# Patient Record
Sex: Female | Born: 1990 | Race: White | Hispanic: No | Marital: Married | Smoking: Former smoker
Health system: Southern US, Community
[De-identification: ages and names within clinical notes are randomized; demographics above are authoritative.]

## PROBLEM LIST (undated history)

## (undated) ENCOUNTER — Inpatient Hospital Stay (HOSPITAL_COMMUNITY): Payer: Self-pay

## (undated) ENCOUNTER — Inpatient Hospital Stay (HOSPITAL_COMMUNITY): Payer: Medicaid Other

## (undated) DIAGNOSIS — F32A Depression, unspecified: Secondary | ICD-10-CM

## (undated) DIAGNOSIS — L309 Dermatitis, unspecified: Secondary | ICD-10-CM

## (undated) DIAGNOSIS — Z789 Other specified health status: Secondary | ICD-10-CM

## (undated) DIAGNOSIS — K8020 Calculus of gallbladder without cholecystitis without obstruction: Secondary | ICD-10-CM

## (undated) DIAGNOSIS — R519 Headache, unspecified: Secondary | ICD-10-CM

## (undated) DIAGNOSIS — K219 Gastro-esophageal reflux disease without esophagitis: Secondary | ICD-10-CM

## (undated) DIAGNOSIS — T7840XA Allergy, unspecified, initial encounter: Secondary | ICD-10-CM

## (undated) DIAGNOSIS — O1490 Unspecified pre-eclampsia, unspecified trimester: Secondary | ICD-10-CM

## (undated) DIAGNOSIS — G8929 Other chronic pain: Secondary | ICD-10-CM

## (undated) DIAGNOSIS — Z87440 Personal history of urinary (tract) infections: Secondary | ICD-10-CM

## (undated) HISTORY — DX: Dermatitis, unspecified: L30.9

## (undated) HISTORY — DX: Personal history of urinary (tract) infections: Z87.440

## (undated) HISTORY — PX: ADENOIDECTOMY: SUR15

## (undated) HISTORY — DX: Depression, unspecified: F32.A

## (undated) HISTORY — DX: Allergy, unspecified, initial encounter: T78.40XA

## (undated) HISTORY — DX: Unspecified pre-eclampsia, unspecified trimester: O14.90

## (undated) HISTORY — DX: Other chronic pain: G89.29

## (undated) HISTORY — PX: TONSILLECTOMY: SUR1361

## (undated) HISTORY — PX: ERCP: SHX60

## (undated) HISTORY — DX: Headache, unspecified: R51.9

---
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
## 2010-01-30 NOTE — L&D Delivery Note (Signed)
Delivery Note At 7:11 AM a viable and healthy female was delivered via Vaginal, Spontaneous Delivery (Presentation: Left Occiput Anterior).  APGAR: 9, 9; weight pending.   Placenta status: Intact, Spontaneous.  Cord: 3 vessels with the following complications: None.    Anesthesia: Local  Episiotomy: None Lacerations: 1st degree;Perineal, 1st degree periclitoral Suture Repair: 3.0 vicryl Est. Blood Loss (mL): 200  Mom to postpartum.  Baby to nursery-stable.  Miliana Gangwer JEHIEL 12/01/2010, 7:40 AM

## 2010-03-03 LAB — HIV ANTIBODY (ROUTINE TESTING W REFLEX): HIV: NONREACTIVE

## 2010-04-21 LAB — URINALYSIS, ROUTINE W REFLEX MICROSCOPIC
Bilirubin Urine: NEGATIVE
Glucose, UA: NEGATIVE mg/dL
Hgb urine dipstick: NEGATIVE
Ketones, ur: NEGATIVE mg/dL
Nitrite: NEGATIVE
Protein, ur: NEGATIVE mg/dL
Specific Gravity, Urine: 1.027 (ref 1.005–1.030)
Urobilinogen, UA: 4 mg/dL — ABNORMAL HIGH (ref 0.0–1.0)
pH: 7 (ref 5.0–8.0)

## 2010-05-09 ENCOUNTER — Inpatient Hospital Stay (HOSPITAL_COMMUNITY)
Admission: AD | Admit: 2010-05-09 | Discharge: 2010-05-09 | Disposition: A | Discharge status: Home / Self Care | Payer: Medicaid Other | Source: Ambulatory Visit | Attending: Family Medicine | Admitting: Family Medicine

## 2010-05-09 DIAGNOSIS — O211 Hyperemesis gravidarum with metabolic disturbance: Secondary | ICD-10-CM

## 2010-05-09 DIAGNOSIS — O210 Mild hyperemesis gravidarum: Secondary | ICD-10-CM | POA: Insufficient documentation

## 2010-05-09 DIAGNOSIS — O2390 Unspecified genitourinary tract infection in pregnancy, unspecified trimester: Secondary | ICD-10-CM

## 2010-05-09 LAB — URINALYSIS, ROUTINE W REFLEX MICROSCOPIC
Ketones, ur: 15 mg/dL — AB
Leukocytes, UA: NEGATIVE
Nitrite: POSITIVE — AB
Nitrite: NEGATIVE
Specific Gravity, Urine: >1.03 — ABNORMAL HIGH (ref 1.005–1.030)
Specific Gravity, Urine: 1.025 (ref 1.005–1.030)
Urobilinogen, UA: 1 mg/dL (ref 0.0–1.0)
Urobilinogen, UA: 4 mg/dL — ABNORMAL HIGH (ref 0.0–1.0)

## 2010-05-09 LAB — CBC
Hemoglobin: 12.1 g/dL (ref 12.0–15.0)
MCH: 25.6 pg — ABNORMAL LOW (ref 26.0–34.0)
MCHC: 32.5 g/dL (ref 30.0–36.0)
MCV: 78.8 fL (ref 78.0–100.0)
RBC: 4.72 MIL/uL (ref 3.87–5.11)

## 2010-05-09 LAB — COMPREHENSIVE METABOLIC PANEL
BUN: 3 mg/dL — ABNORMAL LOW (ref 6–23)
CO2: 24 mEq/L (ref 19–32)
Chloride: 102 mEq/L (ref 96–112)
Creatinine, Ser: 0.6 mg/dL (ref 0.4–1.2)
GFR calc non Af Amer: >60 mL/min (ref >60)
Glucose, Bld: 82 mg/dL (ref 70–99)
Total Bilirubin: 1.6 mg/dL — ABNORMAL HIGH (ref 0.3–1.2)

## 2010-05-09 LAB — GLUCOSE, CAPILLARY: Glucose-Capillary: 88 mg/dL (ref 70–99)

## 2010-05-10 LAB — URINE MICROSCOPIC-ADD ON

## 2010-05-10 LAB — WET PREP, GENITAL: Trich, Wet Prep: NONE SEEN

## 2010-05-10 LAB — URINALYSIS, ROUTINE W REFLEX MICROSCOPIC
Glucose, UA: NEGATIVE mg/dL
Ketones, ur: 40 mg/dL — AB
Leukocytes, UA: NEGATIVE
pH: 6 (ref 5.0–8.0)

## 2010-05-10 LAB — URINE CULTURE
Colony Count: NO GROWTH
Culture  Setup Time: 201204100016

## 2010-05-10 LAB — GC/CHLAMYDIA PROBE AMP, GENITAL: Chlamydia, DNA Probe: NEGATIVE

## 2010-05-24 ENCOUNTER — Inpatient Hospital Stay (HOSPITAL_COMMUNITY)
Admission: AD | Admit: 2010-05-24 | Discharge: 2010-05-24 | Disposition: A | Discharge status: Home / Self Care | Payer: Medicaid Other | Source: Ambulatory Visit | Attending: Obstetrics & Gynecology | Admitting: Obstetrics & Gynecology

## 2010-05-24 DIAGNOSIS — O210 Mild hyperemesis gravidarum: Secondary | ICD-10-CM

## 2010-05-24 LAB — COMPREHENSIVE METABOLIC PANEL
ALT: 27 U/L (ref 0–35)
AST: 26 U/L (ref 0–37)
CO2: 24 mEq/L (ref 19–32)
Calcium: 9.1 mg/dL (ref 8.4–10.5)
Chloride: 104 mEq/L (ref 96–112)
GFR calc Af Amer: >60 mL/min (ref >60)
GFR calc non Af Amer: >60 mL/min (ref >60)
Sodium: 137 mEq/L (ref 135–145)
Total Bilirubin: 1.8 mg/dL — ABNORMAL HIGH (ref 0.3–1.2)

## 2010-05-24 LAB — URINALYSIS, ROUTINE W REFLEX MICROSCOPIC
Glucose, UA: NEGATIVE mg/dL
Ketones, ur: >80 mg/dL — AB
Protein, ur: NEGATIVE mg/dL

## 2010-05-24 LAB — URINE MICROSCOPIC-ADD ON

## 2010-05-24 LAB — CBC
Hemoglobin: 12.1 g/dL (ref 12.0–15.0)
RBC: 4.7 MIL/uL (ref 3.87–5.11)

## 2010-05-25 ENCOUNTER — Observation Stay (HOSPITAL_COMMUNITY)
Admission: AD | Admit: 2010-05-25 | Discharge: 2010-05-26 | Disposition: A | Discharge status: Home / Self Care | Payer: Medicaid Other | Source: Ambulatory Visit | Attending: Obstetrics & Gynecology | Admitting: Obstetrics & Gynecology

## 2010-05-25 ENCOUNTER — Inpatient Hospital Stay (HOSPITAL_COMMUNITY): Payer: Medicaid Other

## 2010-05-25 DIAGNOSIS — O210 Mild hyperemesis gravidarum: Principal | ICD-10-CM | POA: Insufficient documentation

## 2010-05-25 LAB — DIFFERENTIAL
Eosinophils Relative: 1 % (ref 0–5)
Lymphocytes Relative: 28 % (ref 12–46)
Lymphs Abs: 1.7 10*3/uL (ref 0.7–4.0)
Neutro Abs: 4 10*3/uL (ref 1.7–7.7)

## 2010-05-25 LAB — CBC
HCT: 31.1 % — ABNORMAL LOW (ref 36.0–46.0)
Hemoglobin: 10 g/dL — ABNORMAL LOW (ref 12.0–15.0)
MCV: 79.1 fL (ref 78.0–100.0)
RBC: 3.93 MIL/uL (ref 3.87–5.11)
WBC: 6.2 10*3/uL (ref 4.0–10.5)

## 2010-05-25 LAB — COMPREHENSIVE METABOLIC PANEL
AST: 31 U/L (ref 0–37)
Albumin: 2.7 g/dL — ABNORMAL LOW (ref 3.5–5.2)
Alkaline Phosphatase: 55 U/L (ref 39–117)
Chloride: 105 mEq/L (ref 96–112)
GFR calc Af Amer: >60 mL/min (ref >60)
Potassium: 3 mEq/L — ABNORMAL LOW (ref 3.5–5.1)
Sodium: 135 mEq/L (ref 135–145)
Total Bilirubin: 1.7 mg/dL — ABNORMAL HIGH (ref 0.3–1.2)
Total Protein: 5.1 g/dL — ABNORMAL LOW (ref 6.0–8.3)

## 2010-05-25 LAB — URINALYSIS, ROUTINE W REFLEX MICROSCOPIC
Hgb urine dipstick: NEGATIVE
Ketones, ur: >80 mg/dL — AB
Nitrite: NEGATIVE
pH: 6 (ref 5.0–8.0)

## 2010-05-25 LAB — RPR: RPR: NONREACTIVE

## 2010-05-25 LAB — URINE CULTURE: Culture  Setup Time: 201204241905

## 2010-05-25 LAB — RAPID URINE DRUG SCREEN, HOSP PERFORMED
Amphetamines: NOT DETECTED
Barbiturates: NOT DETECTED

## 2010-05-25 LAB — TYPE AND SCREEN: Antibody Screen: NEGATIVE

## 2010-05-25 LAB — URINE MICROSCOPIC-ADD ON

## 2010-05-25 LAB — ABO/RH: RH Type: NEGATIVE

## 2010-05-26 LAB — HEPATITIS B SURFACE ANTIGEN: Hepatitis B Surface Ag: NEGATIVE

## 2010-05-26 LAB — HIV ANTIBODY (ROUTINE TESTING W REFLEX): HIV: NONREACTIVE

## 2010-05-26 LAB — T3, FREE: T3, Free: 3.8 pg/mL (ref 2.3–4.2)

## 2010-06-03 ENCOUNTER — Observation Stay (HOSPITAL_COMMUNITY)
Admission: AD | Admit: 2010-06-03 | Discharge: 2010-06-06 | Disposition: A | Discharge status: Home / Self Care | Payer: Medicaid Other | Source: Ambulatory Visit | Attending: Obstetrics & Gynecology | Admitting: Obstetrics & Gynecology

## 2010-06-03 DIAGNOSIS — O210 Mild hyperemesis gravidarum: Principal | ICD-10-CM | POA: Insufficient documentation

## 2010-06-03 LAB — URINALYSIS, ROUTINE W REFLEX MICROSCOPIC
Ketones, ur: >80 mg/dL — AB
Leukocytes, UA: NEGATIVE
Nitrite: NEGATIVE
Protein, ur: 30 mg/dL — AB
pH: 6 (ref 5.0–8.0)

## 2010-06-03 LAB — BASIC METABOLIC PANEL
CO2: 19 mEq/L (ref 19–32)
Chloride: 99 mEq/L (ref 96–112)
Glucose, Bld: 75 mg/dL (ref 70–99)
Potassium: 3.5 mEq/L (ref 3.5–5.1)
Sodium: 137 mEq/L (ref 135–145)

## 2010-06-05 DIAGNOSIS — O210 Mild hyperemesis gravidarum: Secondary | ICD-10-CM

## 2010-06-26 ENCOUNTER — Inpatient Hospital Stay (HOSPITAL_COMMUNITY)
Admission: AD | Admit: 2010-06-26 | Discharge: 2010-06-27 | Disposition: A | Discharge status: Home / Self Care | Payer: Medicaid Other | Source: Ambulatory Visit | Attending: Obstetrics & Gynecology | Admitting: Obstetrics & Gynecology

## 2010-06-26 DIAGNOSIS — O210 Mild hyperemesis gravidarum: Secondary | ICD-10-CM | POA: Insufficient documentation

## 2010-06-26 LAB — URINALYSIS, ROUTINE W REFLEX MICROSCOPIC
Glucose, UA: NEGATIVE mg/dL
Hgb urine dipstick: NEGATIVE
Nitrite: NEGATIVE
Specific Gravity, Urine: >1.03 — ABNORMAL HIGH (ref 1.005–1.030)
pH: 6 (ref 5.0–8.0)

## 2010-06-26 LAB — URINE MICROSCOPIC-ADD ON

## 2010-08-08 ENCOUNTER — Encounter (HOSPITAL_COMMUNITY): Payer: Self-pay | Admitting: *Deleted

## 2010-08-08 ENCOUNTER — Inpatient Hospital Stay (HOSPITAL_COMMUNITY)
Admission: AD | Admit: 2010-08-08 | Payer: Medicaid Other | Source: Ambulatory Visit | Admitting: Obstetrics & Gynecology

## 2010-08-08 ENCOUNTER — Inpatient Hospital Stay (HOSPITAL_COMMUNITY)
Admission: AD | Admit: 2010-08-08 | Discharge: 2010-08-09 | DRG: 781 | Disposition: A | Discharge status: Home / Self Care | Payer: Medicaid Other | Source: Ambulatory Visit | Attending: Obstetrics & Gynecology | Admitting: Obstetrics & Gynecology

## 2010-08-08 ENCOUNTER — Inpatient Hospital Stay (HOSPITAL_COMMUNITY): Payer: Medicaid Other

## 2010-08-08 DIAGNOSIS — O212 Late vomiting of pregnancy: Secondary | ICD-10-CM

## 2010-08-08 DIAGNOSIS — O9989 Other specified diseases and conditions complicating pregnancy, childbirth and the puerperium: Secondary | ICD-10-CM | POA: Diagnosis present

## 2010-08-08 DIAGNOSIS — R1011 Right upper quadrant pain: Secondary | ICD-10-CM

## 2010-08-08 DIAGNOSIS — K8020 Calculus of gallbladder without cholecystitis without obstruction: Secondary | ICD-10-CM

## 2010-08-08 LAB — COMPREHENSIVE METABOLIC PANEL
AST: 18 U/L (ref 0–37)
CO2: 25 mEq/L (ref 19–32)
Calcium: 9 mg/dL (ref 8.4–10.5)
Creatinine, Ser: 0.53 mg/dL (ref 0.50–1.10)
GFR calc Af Amer: >60 mL/min (ref >60)
GFR calc non Af Amer: >60 mL/min (ref >60)

## 2010-08-08 LAB — URINE MICROSCOPIC-ADD ON

## 2010-08-08 LAB — DIFFERENTIAL
Lymphocytes Relative: 14 % (ref 12–46)
Lymphs Abs: 1.9 10*3/uL (ref 0.7–4.0)
Monocytes Relative: 6 % (ref 3–12)
Neutro Abs: 10.1 10*3/uL — ABNORMAL HIGH (ref 1.7–7.7)
Neutrophils Relative %: 76 % (ref 43–77)

## 2010-08-08 LAB — CBC
Hemoglobin: 10.7 g/dL — ABNORMAL LOW (ref 12.0–15.0)
MCH: 27.3 pg (ref 26.0–34.0)
RBC: 3.92 MIL/uL (ref 3.87–5.11)
WBC: 13.3 10*3/uL — ABNORMAL HIGH (ref 4.0–10.5)

## 2010-08-08 LAB — URINALYSIS, ROUTINE W REFLEX MICROSCOPIC
Glucose, UA: NEGATIVE mg/dL
Ketones, ur: NEGATIVE mg/dL
Protein, ur: NEGATIVE mg/dL

## 2010-08-08 LAB — AMYLASE: Amylase: 106 U/L — ABNORMAL HIGH (ref 0–105)

## 2010-08-08 LAB — LIPASE, BLOOD: Lipase: 45 U/L (ref 11–59)

## 2010-08-08 IMAGING — US US ABDOMEN COMPLETE
1 series · 14 of 25 positions shown · non-contrast
Comparison: None.

CLINICAL DATA: Right upper quadrant pain.  22 weeks pregnant.

COMPLETE ABDOMINAL ULTRASOUND

[Series 1: us abdomen complete · 14 of 68 slices shown]
[im 1/68]
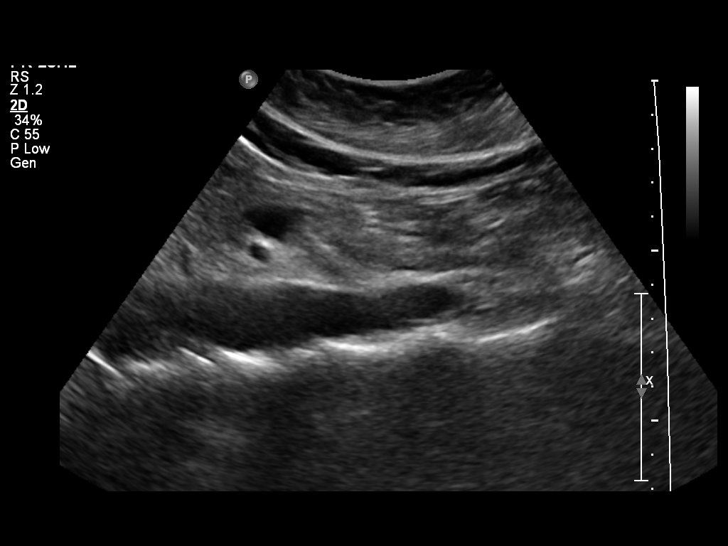
[im 6/68]
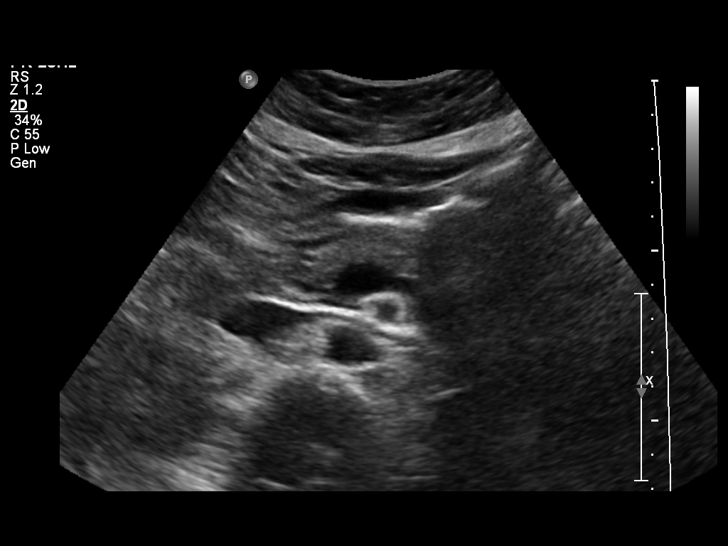
[im 12/68]
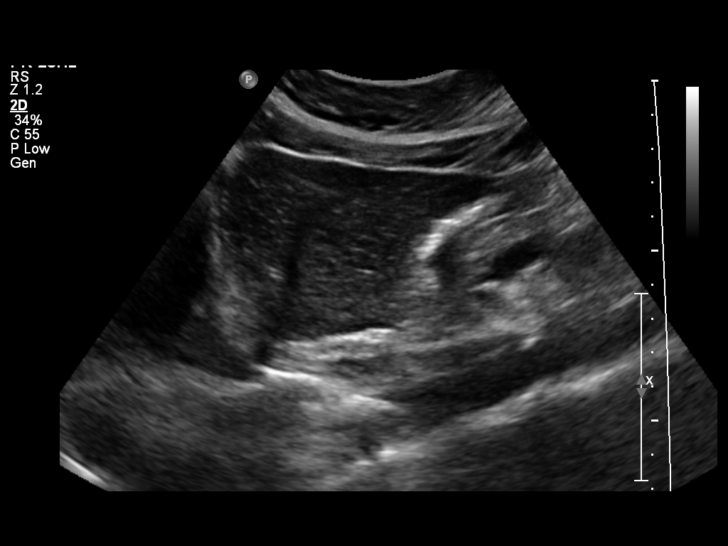
[im 17/68]
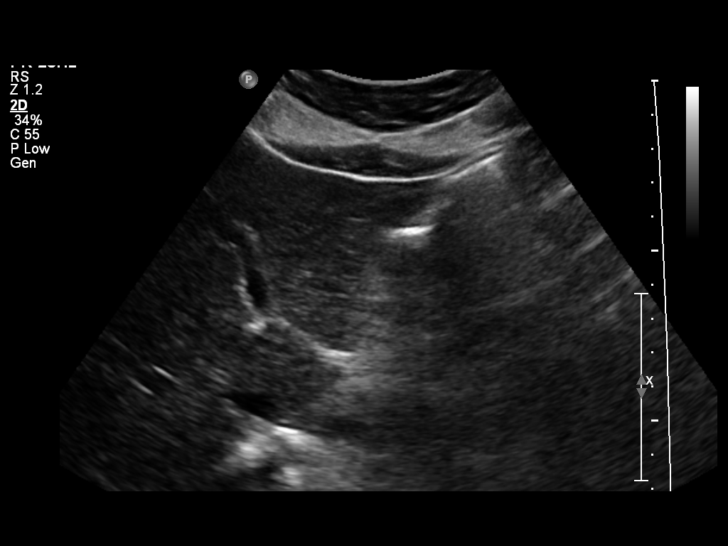
[im 23/68]
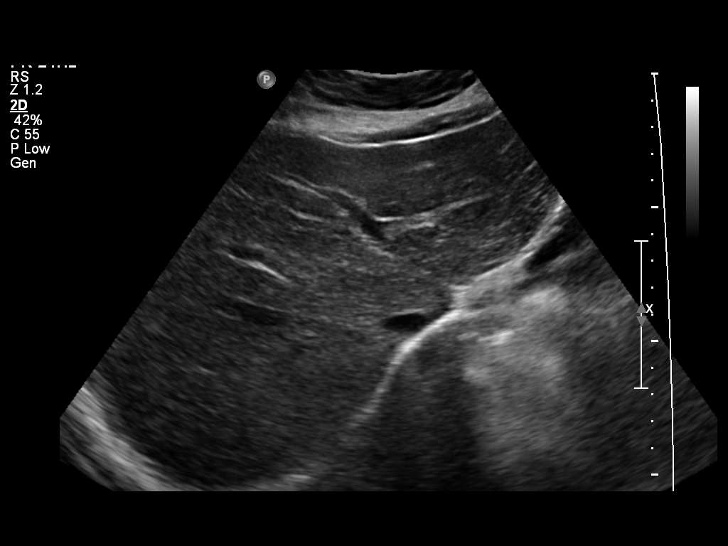
[im 26/68]
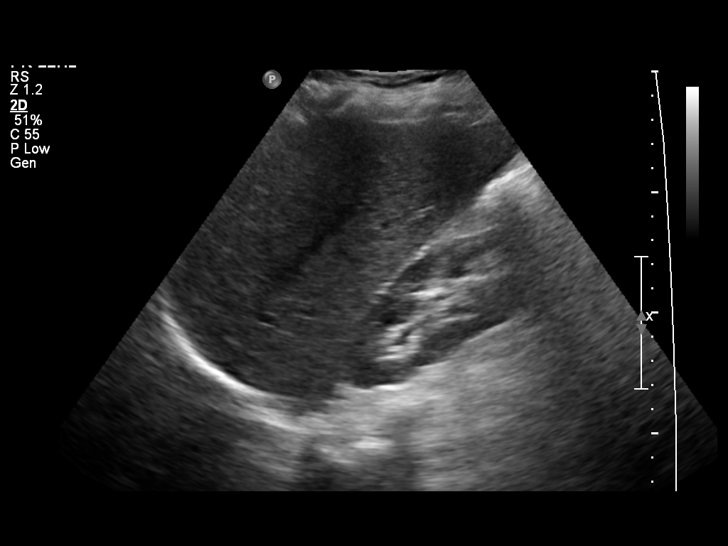
[im 31/68]
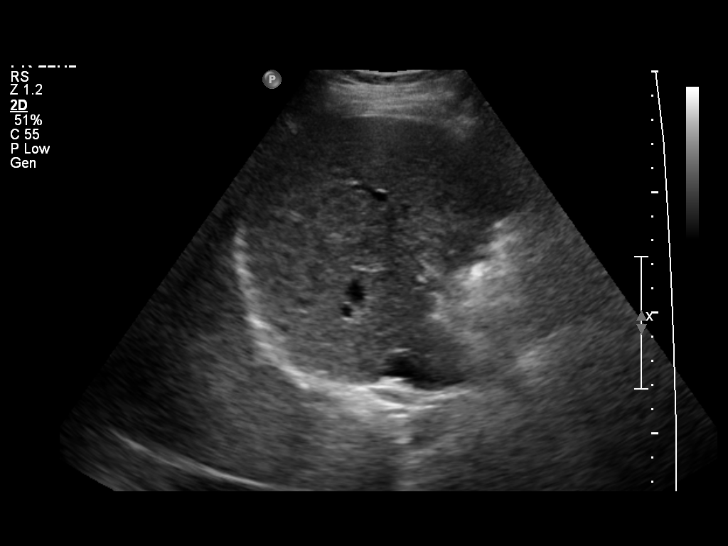
[im 37/68]
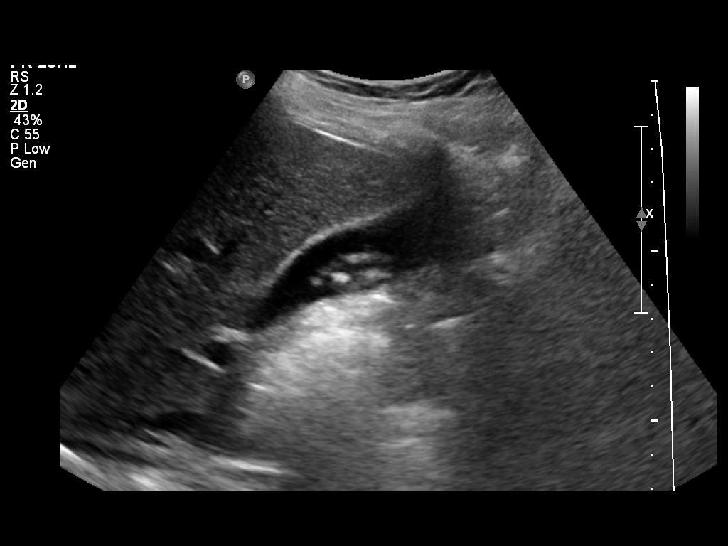
[im 42/68]
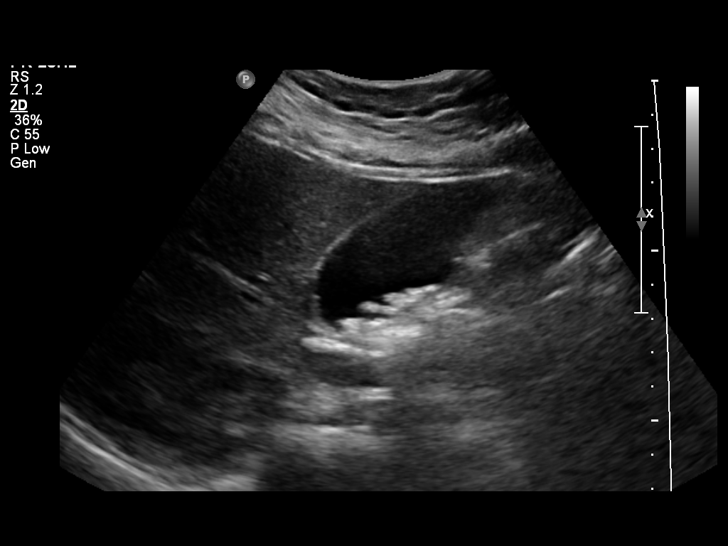
[im 45/68]
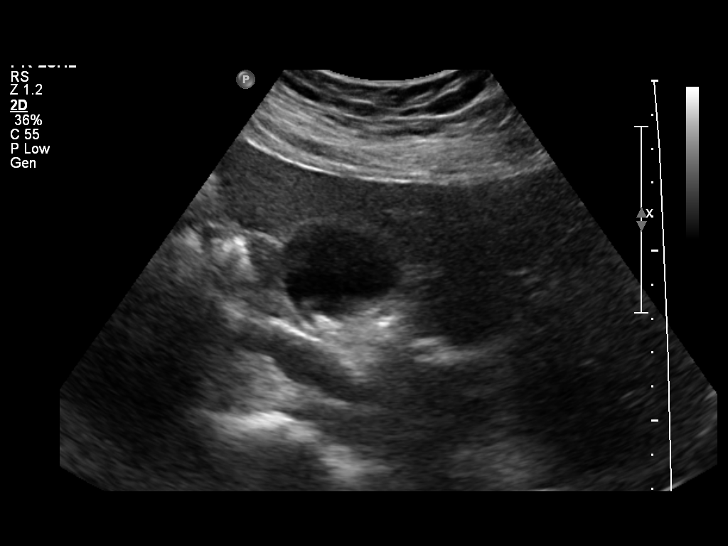
[im 51/68]
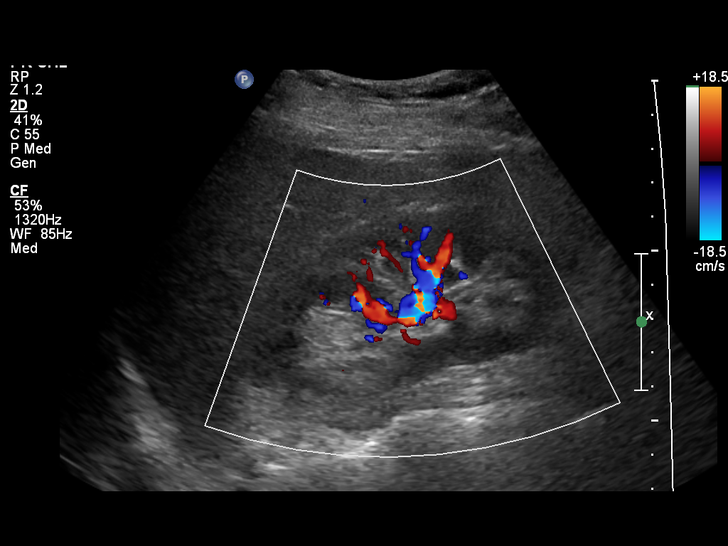
[im 56/68]
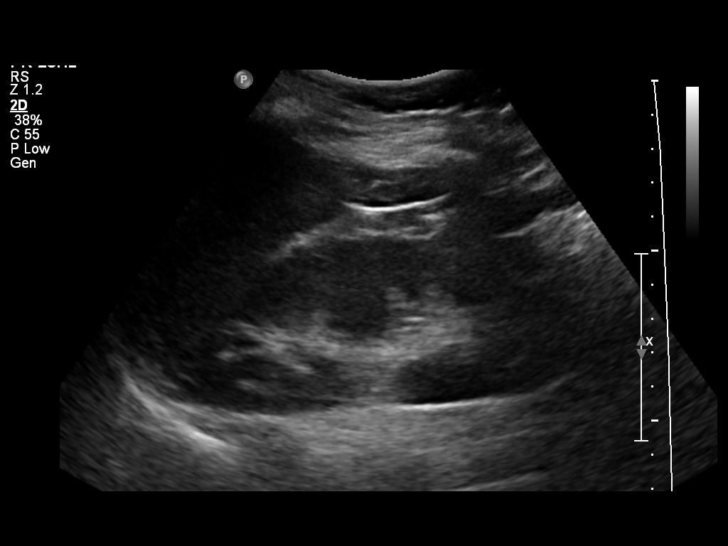
[im 62/68]
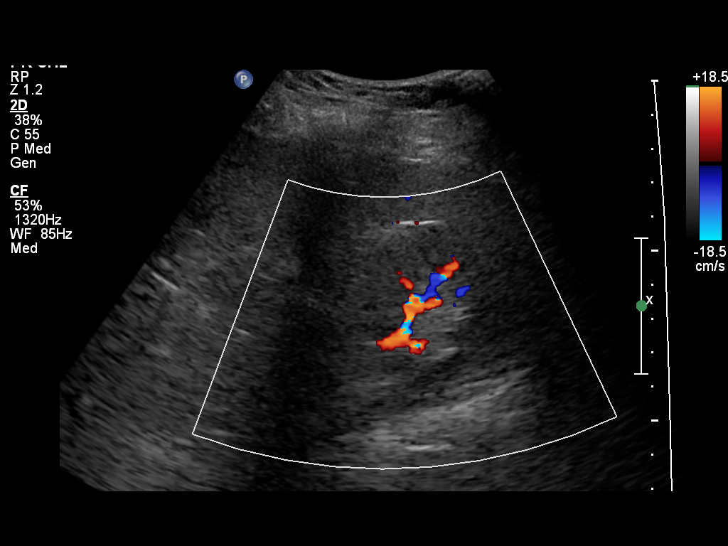
[im 68/68]
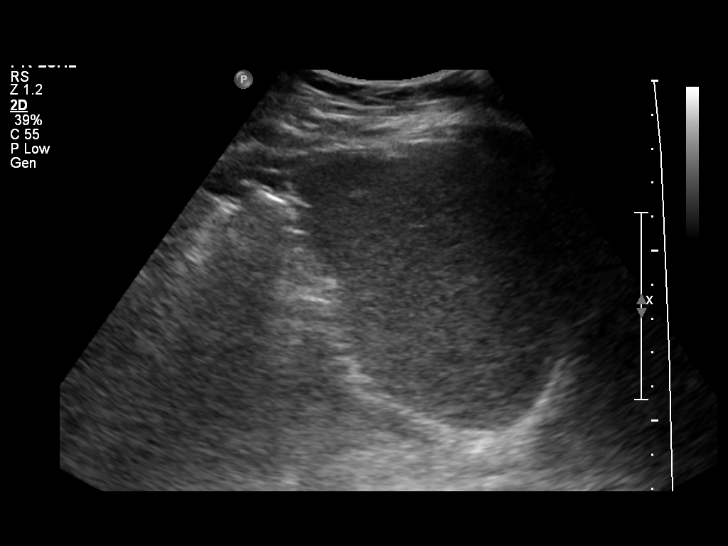

[14 of 25 positions shown; findings below may reference images not displayed]

FINDINGS: Gallbladder:  Multiple small stones in the dependent portion of the
gallbladder.  No sludge or gallbladder wall thickening.  No
pericholecystic edema.

Common bile duct:  No bile duct dilatation.  Common bile duct
measured at 4 mm diameter.

Liver:  No focal lesion identified.  Within normal limits in
parenchymal echogenicity.

IVC:  Appears normal.

Pancreas:  No focal abnormality seen.

Spleen:  Spleen length measures 7.1 cm.  Homogeneous parenchymal
echotexture.

Right Kidney:  Right kidney measures 9.7 cm length.  No
hydronephrosis.

Left Kidney:  Left kidney measures 11.4 cm length.  No
hydronephrosis.

Abdominal aorta:  No aneurysm identified.
IMPRESSION: Cholelithiasis with multiple small stones in the dependent
gallbladder.

## 2010-08-08 MED ORDER — CALCIUM CARBONATE ANTACID 500 MG PO CHEW: 2.0000 | CHEWABLE_TABLET | ORAL | Status: DC | PRN

## 2010-08-08 MED ORDER — DOCUSATE SODIUM 100 MG PO CAPS: 100.0000 mg | ORAL_CAPSULE | Freq: Every day | ORAL | Status: DC

## 2010-08-08 MED ORDER — ONDANSETRON HCL 4 MG/2ML IJ SOLN: 4.0000 mg | Freq: Four times a day (QID) | INTRAMUSCULAR | Status: DC | PRN

## 2010-08-08 MED ORDER — ONDANSETRON 8 MG PO TBDP
8.0000 mg | ORAL_TABLET | Freq: Once | ORAL | Status: DC
  Filled 2010-08-08: qty 1

## 2010-08-08 MED ORDER — PROMETHAZINE HCL 25 MG PO TABS
25.0000 mg | ORAL_TABLET | Freq: Four times a day (QID) | ORAL | Status: DC | PRN
  Administered 2010-08-08: 25 mg via ORAL
  Filled 2010-08-08: qty 1

## 2010-08-08 MED ORDER — ONDANSETRON 8 MG PO TBDP
8.0000 mg | ORAL_TABLET | Freq: Three times a day (TID) | ORAL | Status: DC | PRN
  Filled 2010-08-08: qty 1

## 2010-08-08 MED ORDER — ZOLPIDEM TARTRATE 10 MG PO TABS: 10.0000 mg | ORAL_TABLET | Freq: Every evening | ORAL | Status: DC | PRN

## 2010-08-08 MED ORDER — NALBUPHINE HCL 10 MG/ML IJ SOLN
10.0000 mg | INTRAMUSCULAR | Status: DC | PRN
  Filled 2010-08-08: qty 1

## 2010-08-08 MED ORDER — LACTATED RINGERS IV SOLN
INTRAVENOUS | Status: DC
  Administered 2010-08-08: 06:00:00 via INTRAVENOUS

## 2010-08-08 MED ORDER — ACETAMINOPHEN 325 MG PO TABS: 650.0000 mg | ORAL_TABLET | ORAL | Status: DC | PRN

## 2010-08-08 NOTE — H&P (Signed)
  S: Pt comes in with 3 hours of sharp RUQ pain and vomiting x4 since 1am. Per pt, she last ate cereal around 11pm, [place] around [place], and woke up at 1am with sharp, stabbing RUQ pain, at times radiating around to back and causing SOB. Has had some RUQ aching previously, but has never felt anything like this before. Emesis was nonbloody, nonbilious. No fevers, diarrhea, chills. + nausea. No HA, vision changes, or LEE. Only complication this pregnancy was hyperemesis in 1st trimester, but this feels different. No dysuria or suprapubic pain. No cramping or ctx. No vaginal bleeding or d/c. + FM.  O: AFVSS  Filed Vitals:    08/08/10 0321   BP:  116/71   Pulse:  91   Temp:  98.1 F (36.7 C)   Resp:  20    Gen: NAD, resting in bed, does not appear in pain or uncomfortable  CV: tachycardic, regular rhythm, no murmur  Pulm: CTAB  Abd: gravid, soft, mild TTP RUQ, no guarding, + BS x4 quad  Ext: WWP, no edema  A/P: 20 yo G2P0010 @ 22wks p/w sudden onset RUQ pain ass w/ N/V  Ddx inc cholecystitis vs pancreatitis vs cardiac cause vs viral gastritis vs other infectious etiology  -will check CBC, CMET, UA  -phenergan PRN nausea  -unlikely to be cardiac cause based on pt's age and lack of risk factors, will consider EKG if pt has additional episode while in MAU  -RUQ U/S to r/o gallstones  Addendum: Will admit to antenatal for obs for cholelithiasis and elevated WBC. Will wait to start abx as pt is afebrile until we d/w team. Zofran PRN, IVF, NPO until no longer vomiting then will advance to clears.

## 2010-08-08 NOTE — Progress Notes (Signed)
Since 1am this morning patient has been having sharp,stabbing right upper quadrant pain.  Denies any prior knowledge of gall bladder problems.  Vomiting x 4 since then. Denies ROM or vaginal bleeding.  23wks, G2p0

## 2010-08-08 NOTE — ED Provider Notes (Addendum)
S: Pt comes in with 3 hours of sharp RUQ pain and vomiting x4 since 1am.  Per pt, she last ate cereal around 11pm, [place], and woke up at 1am with sharp, stabbing RUQ pain, at times radiating around to back and causing SOB.  Has had some RUQ aching previously, but has never felt anything like this before.  Emesis was nonbloody, nonbilious. No fevers, diarrhea, chills.  + nausea.  No HA, vision changes, or LEE.  Only complication this pregnancy was hyperemesis in 1st trimester, but this feels different. No dysuria or suprapubic pain. No cramping or ctx. No vaginal bleeding or d/c. + FM.   O: AFVSS   Filed Vitals:   08/08/10 0321  BP: 116/71  Pulse: 91  Temp: 98.1 F (36.7 C)  Resp: 20   Gen: NAD, resting in bed, does not appear in pain or uncomfortable CV: tachycardic, regular rhythm, no murmur Pulm: CTAB Abd: gravid, soft, mild TTP RUQ, no guarding, + BS x4 quad Ext: WWP, no edema  A/P: 20 yo G2P0010 @ 22wks p/w sudden onset RUQ pain ass w/ N/V Ddx inc cholecystitis vs pancreatitis vs cardiac cause vs viral gastritis vs other infectious etiology -will check CBC, CMET, UA -phenergan PRN nausea -unlikely to be cardiac cause based on pt's age and lack of risk factors, will consider EKG if pt has additional episode while in MAU -RUQ U/S to r/o gallstones  Addendum: Will admit to antenatal for obs for cholelithiasis and elevated WBC.  Will wait to start abx as pt is afebrile until we d/w team.  Zofran PRN, IVF, NPO until no longer vomiting then will advance to clears.

## 2010-08-09 MED ORDER — OXYCODONE-ACETAMINOPHEN 5-325 MG PO TABS: 1.0000 | ORAL_TABLET | ORAL | Status: AC | PRN

## 2010-08-09 MED ORDER — DSS 100 MG PO CAPS: 100.0000 mg | ORAL_CAPSULE | Freq: Two times a day (BID) | ORAL | Status: AC | PRN

## 2010-08-09 MED ORDER — ONDANSETRON HCL 8 MG PO TABS: 8.0000 mg | ORAL_TABLET | Freq: Every day | ORAL | Status: DC | PRN

## 2010-08-09 NOTE — Progress Notes (Signed)
S: Denies any pain, N/V.  Tolerates regular diet,  No VB. LOF. Contractions.  O: AFVSS Doppler FHR reassuring Abdomen- NT, gravid Ext- No c/c/e  A/P: 20 yo G2P0010 at 22 5/[redacted] weeks GA, admitted with cholelithiasis.  No s/sx cholecystitis Tolerating regular diet, no pain, no N/V.  Will discharge to home.  Follow up with [facility] later this week. Routine antenatal discharge instructions given.  Laronda Lisby A 08/09/2010 7:31 AM

## 2010-08-09 NOTE — Discharge Summary (Signed)
Obstetric Discharge Summary Reason for Admission: Cholethiasis, nausea and vomiting.  22 4/[redacted] weeks GA.  Gets care at [facility].   Hemoglobin  Date Value Range Status  08/08/2010 10.7* 12.0-15.0 (g/dL) Final     HCT  Date Value Range Status  08/08/2010 32.3* 36.0-46.0 (%) Final    Lab Results  Component Value Date   CREATININE 0.53 08/08/2010   BUN 4* 08/08/2010   NA 135 08/08/2010   K 3.7 08/08/2010   CL 101 08/08/2010   CO2 25 08/08/2010   COMPLETE ABDOMINAL ULTRASOUND 08/08/10 Findings:  Gallbladder: Multiple small stones in the dependent portion of the gallbladder. No sludge or gallbladder wall thickening. No  pericholecystic edema.  Common bile duct: No bile duct dilatation. Common bile duct measured at 4 mm diameter.  Liver: No focal lesion identified. Within normal limits in parenchymal echogenicity.  IVC: Appears normal.  Pancreas: No focal abnormality seen.  Spleen: Spleen length measures 7.1 cm. Homogeneous parenchymal echotexture.  Right Kidney: Right kidney measures 9.7 cm length. No hydronephrosis.  Left Kidney: Left kidney measures 11.4 cm length. No hydronephrosis.  Abdominal aorta: No aneurysm identified.  IMPRESSION:  Cholelithiasis with multiple small stones in the dependent gallbladder.  Brief Hospital Course: [name] is a 20 year old gravida 2 para 0010 who was admitted at 22-4/7 weeks with cholelithiasis and nausea vomiting. Patient was observed overnight and had resolution of symptoms. Normal labs, no evidence of cholecystitis, pancreatitis. She was able to tolerate a regular diet and did not require any pain medications. Given the patient was stable the decision was made to discharge her to home. [facility] evaluation.  Discharge Diagnoses: Resolved nausea and vomiting  Discharge Information: Date: 08/09/2010 Activity: unrestricted Diet: routine Medications: PNV, Colace, Percocet and Zofran as  needed Condition: stable and improved Instructions: refer to practice specific booklet and routine antenatal Discharge to: home    Julie Cummings A 08/09/2010, 7:33 AM

## 2010-10-21 ENCOUNTER — Other Ambulatory Visit: Payer: Self-pay | Admitting: Obstetrics & Gynecology

## 2010-10-21 ENCOUNTER — Other Ambulatory Visit (HOSPITAL_COMMUNITY)
Admission: RE | Admit: 2010-10-21 | Discharge: 2010-10-21 | Disposition: A | Discharge status: Home / Self Care | Payer: Medicaid Other | Source: Ambulatory Visit | Attending: Obstetrics & Gynecology | Admitting: Obstetrics & Gynecology

## 2010-10-21 DIAGNOSIS — Z01419 Encounter for gynecological examination (general) (routine) without abnormal findings: Secondary | ICD-10-CM | POA: Insufficient documentation

## 2010-10-30 ENCOUNTER — Encounter (HOSPITAL_COMMUNITY): Payer: Self-pay | Admitting: *Deleted

## 2010-10-30 ENCOUNTER — Inpatient Hospital Stay (HOSPITAL_COMMUNITY)
Admission: AD | Admit: 2010-10-30 | Discharge: 2010-10-30 | Disposition: A | Discharge status: Home / Self Care | Payer: Medicaid Other | Source: Ambulatory Visit | Attending: Obstetrics & Gynecology | Admitting: Obstetrics & Gynecology

## 2010-10-30 DIAGNOSIS — O368190 Decreased fetal movements, unspecified trimester, not applicable or unspecified: Secondary | ICD-10-CM | POA: Insufficient documentation

## 2010-10-30 DIAGNOSIS — R32 Unspecified urinary incontinence: Secondary | ICD-10-CM

## 2010-10-30 DIAGNOSIS — O4700 False labor before 37 completed weeks of gestation, unspecified trimester: Secondary | ICD-10-CM | POA: Insufficient documentation

## 2010-10-30 HISTORY — DX: Other specified health status: Z78.9

## 2010-10-30 LAB — URINALYSIS, ROUTINE W REFLEX MICROSCOPIC
Glucose, UA: NEGATIVE mg/dL
Leukocytes, UA: NEGATIVE
Nitrite: NEGATIVE
Specific Gravity, Urine: 1.02 (ref 1.005–1.030)
pH: 6 (ref 5.0–8.0)

## 2010-10-30 LAB — AMNISURE RUPTURE OF MEMBRANE (ROM) NOT AT ARMC: Amnisure ROM: NEGATIVE

## 2010-10-30 LAB — POCT FERN TEST: Fern Test: NEGATIVE

## 2010-10-30 NOTE — Progress Notes (Signed)
Pt presents to [facility]au for c/o leaking that started 2 hours ago.  Also states DFM since this am.

## 2010-10-30 NOTE — Progress Notes (Signed)
V. Smith, CNM at bedside.  Assessment done and poc discussed with pt.  

## 2010-10-30 NOTE — Progress Notes (Signed)
Marlynn Perking, CNM notified of negative amnisure.  CNM to discharge pt home.

## 2010-10-30 NOTE — ED Provider Notes (Signed)
Julie Cummings is a 20 y.o. year old G9P0010 female at [redacted]w[redacted]d weeks gestation who presents to MAU reporting: 1. LOF x 3 hours 2. mild to mod UC's that have worsened over the past few hours 3. Decreased FM that has improved since arrival  She denies VB or other vaginal discharge.Pt request no speculum exam if possible.    History OB History    Grav Para Term Preterm Abortions TAB SAB Ect Mult Living   2 0 0 0 1 0 1 0 0 0      Past Medical History  Diagnosis Date  . No pertinent past medical history    Past Surgical History  Procedure Date  . Tonsillectomy    Family History: family history is not on file. Social History:  reports that she has never smoked. She has never used smokeless tobacco. She reports that she does not drink alcohol or use illicit drugs.  ROS: Otherwise neg  Dilation: 1 Effacement (%): 50 Exam by:: Julie Cummings, CNM Blood pressure 126/83, pulse 101, temperature 98.5 F (36.9 C), temperature source Oral, resp. rate 16, height 5\' 5"  (1.651 m), weight 86.183 kg (190 lb). Maternal Exam:  Uterine Assessment: Contraction strength is mild.  Contraction duration is 50 seconds. Contraction frequency is regular.   Abdomen: Patient reports no abdominal tenderness. Fundal height is S=D.   Fetal presentation: vertex  Pelvis: adequate for delivery.   Cervix: Cervix evaluated by digital exam.     Fetal Exam Fetal Monitor Review: Mode: ultrasound.   Baseline rate: 120-130.  Variability: moderate (6-25 bpm).   Pattern: accelerations present and no decelerations.    Fetal State Assessment: Category I - tracings are normal.     Physical Exam  Constitutional: She is oriented to person, place, and time. She appears well-developed and well-nourished. She appears distressed.  Cardiovascular: Normal rate.   Respiratory: Effort normal.  GI: Soft. Bowel sounds are normal.  Genitourinary: No bleeding around the vagina.       Small amount of clear fluid running down  perineum.   Neurological: She is alert and oriented to person, place, and time.  Skin: Skin is warm and dry.  Psychiatric: She has a normal mood and affect.    Prenatal labs: ABO, Rh: --/--/B NEG, B NEG (04/25 1220) Antibody: NEG (04/25 1220) Rubella: 2.8 (NOTE) Reference Range:                                                     < 5.0 IU/mL         Not Immune                                    5.0 - 9.9 IU/mL       Equivocal          >=10.0 IU/mL        Immune (04/25 1220) RPR: NON REACTIVE (04/25 1220)  HBsAg: NEGATIVE (04/25 1220)  HIV: NON REACTIVE (04/25 1220)  GBS:     Results for orders placed during the hospital encounter of 10/30/10 (from the past 24 hour(s))  URINALYSIS, ROUTINE W REFLEX MICROSCOPIC     Status: Normal   Collection Time   10/30/10  7:00 PM      Component Value  Range   Color, Urine YELLOW  YELLOW    Appearance CLEAR  CLEAR    Specific Gravity, Urine 1.020  1.005 - 1.030    pH 6.0  5.0 - 8.0    Glucose, UA NEGATIVE  NEGATIVE (mg/dL)   Hgb urine dipstick NEGATIVE  NEGATIVE    Bilirubin Urine NEGATIVE  NEGATIVE    Ketones, ur NEGATIVE  NEGATIVE (mg/dL)   Protein, ur NEGATIVE  NEGATIVE (mg/dL)   Urobilinogen, UA 0.2  0.0 - 1.0 (mg/dL)   Nitrite NEGATIVE  NEGATIVE    Leukocytes, UA NEGATIVE  NEGATIVE    Fern: neg  Assessment/Plan: Julie Cummings, CNM assumed care of pt at 2010.  Julie Cummings 10/30/2010, 7:55 PM

## 2010-10-30 NOTE — Progress Notes (Signed)
Patient reports having watery discharge for past two hours, clear, pressure in vagina, decreased fetal movement.

## 2010-10-30 NOTE — Progress Notes (Signed)
Julie Cummings, CNM at bedside.  amnisure collected.

## 2010-10-30 NOTE — ED Provider Notes (Signed)
Amnisure obtained. SVE 1 th/ presenting part will ballot without without loss of fluid.

## 2010-11-02 NOTE — ED Provider Notes (Signed)
Agree with above note.  Julie Cummings H. 11/02/2010 5:32 AM

## 2010-11-02 NOTE — ED Provider Notes (Signed)
Agree with above note.  Julie Cummings H. 11/02/2010 5:31 AM 

## 2010-11-12 ENCOUNTER — Encounter (HOSPITAL_COMMUNITY): Payer: Self-pay | Admitting: *Deleted

## 2010-11-12 ENCOUNTER — Inpatient Hospital Stay (HOSPITAL_COMMUNITY)
Admission: AD | Admit: 2010-11-12 | Discharge: 2010-11-12 | Disposition: A | Discharge status: Home / Self Care | Payer: Medicaid Other | Source: Ambulatory Visit | Attending: Obstetrics and Gynecology | Admitting: Obstetrics and Gynecology

## 2010-11-12 DIAGNOSIS — O4700 False labor before 37 completed weeks of gestation, unspecified trimester: Secondary | ICD-10-CM | POA: Insufficient documentation

## 2010-11-12 DIAGNOSIS — O479 False labor, unspecified: Secondary | ICD-10-CM

## 2010-11-12 MED ORDER — NALBUPHINE SYRINGE 5 MG/0.5 ML
10.0000 mg | INJECTION | Freq: Once | INTRAMUSCULAR | Status: AC
  Administered 2010-11-12: 10 mg via INTRAMUSCULAR
  Filled 2010-11-12: qty 1

## 2010-11-12 NOTE — ED Provider Notes (Signed)
History   G1  At 36 4 in with c/o contractions every 2-3 min for 2 hrs.   Chief Complaint  Patient presents with  . Contractions   HPI  OB History    Grav Para Term Preterm Abortions TAB SAB Ect Mult Living   2 0 0 0 1 0 1 0 0 0       Past Medical History  Diagnosis Date  . No pertinent past medical history     Past Surgical History  Procedure Date  . Tonsillectomy     No family history on file.  History  Substance Use Topics  . Smoking status: Never Smoker   . Smokeless tobacco: Never Used  . Alcohol Use: No    Allergies: No Known Allergies  Prescriptions prior to admission  Medication Sig Dispense Refill  . naphazoline (CLEAR EYES) 0.012 % ophthalmic solution Place 1 drop into both eyes Nightly. For eye irritation      . Pediatric Multiple Vitamins (CHEWABLE MULTIPLE VITAMINS PO) Take 2 tablets by mouth daily.       . ondansetron (ZOFRAN) 8 MG tablet Take 1 tablet (8 mg total) by mouth daily as needed for nausea. For nausea  20 tablet  3    Review of Systems  Constitutional: Negative.   HENT: Negative.   Eyes: Negative.   Respiratory: Negative.   Cardiovascular: Negative.   Gastrointestinal: Positive for abdominal pain.  Genitourinary: Negative.   Musculoskeletal: Negative.   Skin: Negative.   Neurological: Negative.   Endo/Heme/Allergies: Negative.   Psychiatric/Behavioral: Negative.    Physical Exam   Blood pressure 126/69, pulse 84, temperature 97 F (36.1 C), temperature source Oral, resp. rate 18, height 5\' 4"  (1.626 m), weight 86.864 kg (191 lb 8 oz).  Physical Exam  Constitutional: She is oriented to person, place, and time. She appears well-developed and well-nourished.  Cardiovascular: Normal rate, regular rhythm and normal heart sounds.   Respiratory: Effort normal and breath sounds normal.  GI: Soft. Bowel sounds are normal.  Genitourinary: Vagina normal and uterus normal.       SVE ft/th/post/high.  Musculoskeletal: Normal range of  motion.  Neurological: She is alert and oriented to person, place, and time. She has normal reflexes.  Skin: Skin is warm and dry.  Psychiatric: She has a normal mood and affect. Her behavior is normal. Judgment and thought content normal.    MAU Course  Procedures  MDM   Assessment and Plan  False labor, stable maternal-fetal unit. Therapeutic rest and d/c home.  Zerita Boers 11/12/2010, 4:18 PM

## 2010-11-12 NOTE — Progress Notes (Signed)
Pt reprots feeling ctx tahat started around 1330 today. Denies SROm or bleeding at this time and reports good fetal movement

## 2010-11-14 LAB — STREP B DNA PROBE: GBS: NEGATIVE

## 2010-11-14 NOTE — ED Provider Notes (Signed)
Agree with above note.  Julie Cummings 11/14/2010 2:05 PM   

## 2010-11-20 ENCOUNTER — Inpatient Hospital Stay (HOSPITAL_COMMUNITY)
Admission: AD | Admit: 2010-11-20 | Discharge: 2010-11-20 | Disposition: A | Discharge status: Home / Self Care | Payer: Medicaid Other | Source: Ambulatory Visit | Attending: Obstetrics and Gynecology | Admitting: Obstetrics and Gynecology

## 2010-11-20 ENCOUNTER — Encounter (HOSPITAL_COMMUNITY): Payer: Self-pay

## 2010-11-20 DIAGNOSIS — O99891 Other specified diseases and conditions complicating pregnancy: Secondary | ICD-10-CM | POA: Insufficient documentation

## 2010-11-20 DIAGNOSIS — O479 False labor, unspecified: Secondary | ICD-10-CM

## 2010-11-20 LAB — WET PREP, GENITAL: Clue Cells Wet Prep HPF POC: NONE SEEN

## 2010-11-20 NOTE — ED Provider Notes (Signed)
History     Chief Complaint  Patient presents with  . Rupture of Membranes   HPI 20yo G2P0010 at 37.3 wks here for concern for ruptured membranes occurring this AM.  Began having contractions after admission to MAU, feeling them every 10 minutes or so.  Good fetal movement.  No vaginal bleeding.    OB History    Grav Para Term Preterm Abortions TAB SAB Ect Mult Living   2 0 0 0 1 0 1 0 0 0       Past Medical History  Diagnosis Date  . No pertinent past medical history     Past Surgical History  Procedure Date  . Tonsillectomy     No family history on file.  History  Substance Use Topics  . Smoking status: Former Smoker -- .5 years  . Smokeless tobacco: Never Used  . Alcohol Use: No    Allergies: No Known Allergies  Prescriptions prior to admission  Medication Sig Dispense Refill  . naphazoline (CLEAR EYES) 0.012 % ophthalmic solution Place 1 drop into both eyes Nightly. For eye irritation      . ondansetron (ZOFRAN) 8 MG tablet Take 1 tablet (8 mg total) by mouth daily as needed for nausea. For nausea  20 tablet  3  . Pediatric Multiple Vitamins (CHEWABLE MULTIPLE VITAMINS PO) Take 2 tablets by mouth daily.         Review of Systems  Constitutional: Negative for fever, chills and malaise/fatigue.  HENT: Negative for nosebleeds and congestion.   Eyes: Negative for blurred vision and double vision.  Respiratory: Negative for cough and wheezing.   Cardiovascular: Negative for chest pain and palpitations.  Gastrointestinal: Negative for heartburn, nausea and vomiting.  Genitourinary: Negative for dysuria, urgency and frequency.  Musculoskeletal: Negative for myalgias.  Skin: Negative for itching and rash.  Neurological: Negative for dizziness, tingling, weakness and headaches.  Endo/Heme/Allergies: Does not bruise/bleed easily.  Psychiatric/Behavioral: Negative for depression and suicidal ideas.   Physical Exam   Blood pressure 118/82, pulse 99, temperature 97.8  F (36.6 C), temperature source Oral, resp. rate 18, height 5\' 4"  (1.626 m), weight 191 lb 12.8 oz (87 kg).  Physical Exam  Constitutional: She is oriented to person, place, and time. She appears well-developed and well-nourished.  HENT:  Head: Normocephalic and atraumatic.  Eyes: Conjunctivae and EOM are normal. Pupils are equal, round, and reactive to light.  Neck: Normal range of motion. Neck supple.  Cardiovascular: Normal rate and regular rhythm.   Respiratory: Effort normal and breath sounds normal.  GI:       Gravid, fundal heights consistent with dates.   Neurological: She is alert and oriented to person, place, and time. She has normal reflexes.  Skin: Skin is warm and dry.  Psychiatric: She has a normal mood and affect.    MAU Course  Procedures  Sterile speculum exam:  No pooling, mild white discharge noted, ferning slide and wet prep obtained. SVE:  1/40/-3 FHR 120s/+accels/no decels/moderate reactivity Toco:  q5 - 10 min/mild  Assessment and Plan  20yo G2P0010 at 37.3 wks, rule rupture: - No pooling noted - Ferning negative - membranes intact - wet prep showed only regular discharge.   - Plan to DC home.    Mcguire Gasparyan,JEFF 11/20/2010, 12:07 PM

## 2010-11-20 NOTE — Progress Notes (Signed)
Pt reports having a gush of fluid this morning. Roports still having some wetness. Reports increased pelvic pressure and sharp abd pain.Reports good fetal movement

## 2010-11-26 ENCOUNTER — Inpatient Hospital Stay (HOSPITAL_COMMUNITY)
Admission: AD | Admit: 2010-11-26 | Discharge: 2010-11-27 | Disposition: A | Discharge status: Home / Self Care | Payer: Medicaid Other | Source: Ambulatory Visit | Attending: Obstetrics & Gynecology | Admitting: Obstetrics & Gynecology

## 2010-11-26 ENCOUNTER — Encounter (HOSPITAL_COMMUNITY): Payer: Self-pay | Admitting: Obstetrics and Gynecology

## 2010-11-26 ENCOUNTER — Inpatient Hospital Stay (HOSPITAL_COMMUNITY)
Admission: AD | Admit: 2010-11-26 | Discharge: 2010-11-26 | Disposition: A | Discharge status: Home / Self Care | Payer: Medicaid Other | Source: Ambulatory Visit | Attending: Obstetrics & Gynecology | Admitting: Obstetrics & Gynecology

## 2010-11-26 DIAGNOSIS — O368190 Decreased fetal movements, unspecified trimester, not applicable or unspecified: Secondary | ICD-10-CM | POA: Insufficient documentation

## 2010-11-26 DIAGNOSIS — R109 Unspecified abdominal pain: Secondary | ICD-10-CM | POA: Insufficient documentation

## 2010-11-26 DIAGNOSIS — O479 False labor, unspecified: Secondary | ICD-10-CM

## 2010-11-26 HISTORY — DX: Calculus of gallbladder without cholecystitis without obstruction: K80.20

## 2010-11-26 MED ORDER — OXYCODONE-ACETAMINOPHEN 5-325 MG PO TABS
2.0000 | ORAL_TABLET | Freq: Once | ORAL | Status: AC
  Administered 2010-11-26: 2 via ORAL
  Filled 2010-11-26: qty 2

## 2010-11-26 NOTE — Progress Notes (Signed)
Pt also states she has not felt the baby move since this morning

## 2010-11-26 NOTE — Progress Notes (Signed)
Pt presents to MAU with chief complaint of right sided pain, and contractions that started last night around 7:00pm. Pt is a G2P0 at 38 weeks. Pt has a hx of gall stones with this pregnancy and is concerned that the right sided pain is related to gall stones.

## 2010-11-26 NOTE — ED Provider Notes (Signed)
History   G2P0010 @ 38 2 in with c/o labor and rt upper quad discomfort that longstanding in nature. Pt diagnosed with gallstones during this pregnancy. Pain has not changed it continue to be dull throbbing in nature thjat is intermittant in nature.  Chief Complaint  Patient presents with  . Contractions  . Abdominal Pain   HPI  OB History    Grav Para Term Preterm Abortions TAB SAB Ect Mult Living   2 0 0 0 1 0 1 0 0 0       Past Medical History  Diagnosis Date  . No pertinent past medical history   . Gall stones     Past Surgical History  Procedure Date  . Tonsillectomy     No family history on file.  History  Substance Use Topics  . Smoking status: Former Smoker -- .5 years  . Smokeless tobacco: Never Used  . Alcohol Use: No    Allergies: No Known Allergies  Prescriptions prior to admission  Medication Sig Dispense Refill  . naphazoline (CLEAR EYES) 0.012 % ophthalmic solution Place 1 drop into both eyes Nightly. For eye irritation      . Pediatric Multiple Vitamins (CHEWABLE MULTIPLE VITAMINS PO) Take 2 tablets by mouth daily.       . ondansetron (ZOFRAN) 8 MG tablet Take 1 tablet (8 mg total) by mouth daily as needed for nausea. For nausea  20 tablet  3    Review of Systems  Constitutional: Negative.   HENT: Negative.   Eyes: Negative.   Respiratory: Negative.   Cardiovascular: Negative.   Gastrointestinal: Negative.   Genitourinary: Negative.   Musculoskeletal: Negative.   Skin: Negative.   Neurological: Negative.   Endo/Heme/Allergies: Negative.   Psychiatric/Behavioral: Negative.    Physical Exam   Blood pressure 116/82, pulse 87, temperature 97.5 F (36.4 C), temperature source Oral, resp. rate 20, height 5\' 4"  (1.626 m), weight 87.998 kg (194 lb).  Physical Exam  Constitutional: She is oriented to person, place, and time. She appears well-developed and well-nourished.  Cardiovascular: Normal rate, regular rhythm, normal heart sounds and  intact distal pulses.   Respiratory: Breath sounds normal.  GI: Soft. Bowel sounds are normal.  Genitourinary: Vagina normal and uterus normal.  Musculoskeletal: Normal range of motion.  Neurological: She is alert and oriented to person, place, and time. She has normal reflexes.  Skin: Skin is warm and dry.  Psychiatric: She has a normal mood and affect. Her behavior is normal. Judgment and thought content normal.    MAU Course  Procedures  MDM   Assessment and Plan  No cervical change noted after 1+ hour of walking. Stable maternal fetal unit. Not in labor. D/c home.  Zerita Boers 11/26/2010, 10:41 AM

## 2010-11-26 NOTE — Progress Notes (Signed)
Pt states, " I started having contractions at 5 pm, and they are stronger and now every 4-5 min."

## 2010-11-26 NOTE — Progress Notes (Signed)
Marlynn Perking CNM notified of patient arrival and complaints of contractions and right upper quad, dull pain. Pt has a hx of gall stones. Marlynn Perking CNM gave orders to check cervix and notify her.

## 2010-11-26 NOTE — Progress Notes (Signed)
"  I haven't felt the baby move since I left here earlier today.  I'm having UC's that are a lot stronger than the ones I was having when I was here before.  No bleeding or leaking of fluid.  I was 1.5 cm/70% earlier."

## 2010-11-26 NOTE — ED Provider Notes (Signed)
History     Chief Complaint  Patient presents with  . Contractions  . Decreased Fetal Movement   HPI 20 year old female at 38.[redacted] weeks EGA presenting for contractions and concern for decreased fetal movement. She was evaluated in the MAU previously today and was determined not to be in active labor. Currently, she notes that fetal movement has increased. She is having regular, strong contractions with lots of pelvic pressure. She denies leakage of fluid or vaginal bleeding. She notes headache that is pressure behind her eyes. She denies changes in vision, shortness of breath, fever and chills.   OB History    Grav Para Term Preterm Abortions TAB SAB Ect Mult Living   2 0 0 0 1 0 1 0 0 0       Past Medical History  Diagnosis Date  . No pertinent past medical history   . Gall stones     Past Surgical History  Procedure Date  . Tonsillectomy     No family history on file.  History  Substance Use Topics  . Smoking status: Former Smoker -- .5 years  . Smokeless tobacco: Never Used  . Alcohol Use: No    Allergies: No Known Allergies  Prescriptions prior to admission  Medication Sig Dispense Refill  . naphazoline (CLEAR EYES) 0.012 % ophthalmic solution Place 1 drop into both eyes Nightly. For eye irritation      . ondansetron (ZOFRAN) 8 MG tablet Take 1 tablet (8 mg total) by mouth daily as needed for nausea. For nausea  20 tablet  3  . Pediatric Multiple Vitamins (CHEWABLE MULTIPLE VITAMINS PO) Take 2 tablets by mouth daily.         ROS See HPI  Physical Exam   Blood pressure 119/68, pulse 85, temperature 98.2 F (36.8 C), temperature source Oral, resp. rate 20, height 5' 3.25" (1.607 m), weight 87.147 kg (192 lb 2 oz).  Physical Exam  Constitutional: Vital signs are normal. She appears well-developed and well-nourished.  Cardiovascular: Normal rate, regular rhythm, normal heart sounds and intact distal pulses.   Respiratory: Effort normal and breath sounds normal.    Musculoskeletal: She exhibits no edema.     MAU Course  Procedures Dilation: 2 Effacement (%): Thick Cervical Position: Anterior Station: Ballotable Presentation: Vertex Exam by:: Dr Clinton Sawyer  Tocometer: Irregular contractions every 5 minuets Fetoscope: 130, moderate variability, accelerations present, no decelerations   MDM No evidence of active labor  Assessment and Plan  Patient will be discharged and told to return to the MAU if her contraction are regularly every 3-4 minutes for > 20 minutes of if her water breaks. She already has follow-up scheduled for Tuesday at [facility].   Clinton Sawyer, Njeri Vicente 11/26/2010, 11:00 PM

## 2010-11-26 NOTE — Progress Notes (Signed)
Pt reports having ctx since last night since 1930 . Getting stronger and closer. Also reports Pain in URQ that she describes as gallbladder pain (has had dx of gallstones during pregnancy). Reports good fetal movement and deneis SROM or vag bleeding.

## 2010-11-26 NOTE — Progress Notes (Signed)
Dr Clinton Sawyer in to see pt. Labor precautions discussed and d/c plan

## 2010-11-26 NOTE — Progress Notes (Signed)
Marlynn Perking CNM notified of cervical exam and pain status. CNM will need to see patient before discharge home. Marlynn Perking CNM will be to MAU when she can to assess patient and discharge home.

## 2010-11-29 ENCOUNTER — Inpatient Hospital Stay (HOSPITAL_COMMUNITY)
Admission: AD | Admit: 2010-11-29 | Discharge: 2010-11-30 | Disposition: A | Discharge status: Home / Self Care | Payer: Medicaid Other | Source: Ambulatory Visit | Attending: Obstetrics & Gynecology | Admitting: Obstetrics & Gynecology

## 2010-11-29 ENCOUNTER — Encounter (HOSPITAL_COMMUNITY): Payer: Self-pay | Admitting: *Deleted

## 2010-11-29 DIAGNOSIS — O479 False labor, unspecified: Secondary | ICD-10-CM

## 2010-11-29 DIAGNOSIS — R030 Elevated blood-pressure reading, without diagnosis of hypertension: Secondary | ICD-10-CM | POA: Insufficient documentation

## 2010-11-29 LAB — CBC
HCT: 29.7 % — ABNORMAL LOW (ref 36.0–46.0)
Hemoglobin: 9.3 g/dL — ABNORMAL LOW (ref 12.0–15.0)
MCH: 24.2 pg — ABNORMAL LOW (ref 26.0–34.0)
MCV: 77.3 fL — ABNORMAL LOW (ref 78.0–100.0)
RBC: 3.84 MIL/uL — ABNORMAL LOW (ref 3.87–5.11)

## 2010-11-29 LAB — URINE MICROSCOPIC-ADD ON

## 2010-11-29 LAB — COMPREHENSIVE METABOLIC PANEL
ALT: 6 U/L (ref 0–35)
BUN: 4 mg/dL — ABNORMAL LOW (ref 6–23)
CO2: 23 mEq/L (ref 19–32)
Calcium: 9.1 mg/dL (ref 8.4–10.5)
GFR calc Af Amer: >90 mL/min (ref >90)
GFR calc non Af Amer: >90 mL/min (ref >90)
Glucose, Bld: 81 mg/dL (ref 70–99)
Sodium: 135 mEq/L (ref 135–145)

## 2010-11-29 LAB — URINALYSIS, ROUTINE W REFLEX MICROSCOPIC
Bilirubin Urine: NEGATIVE
Ketones, ur: 15 mg/dL — AB
Nitrite: NEGATIVE
Specific Gravity, Urine: 1.01 (ref 1.005–1.030)
Urobilinogen, UA: 0.2 mg/dL (ref 0.0–1.0)

## 2010-11-29 NOTE — Progress Notes (Signed)
Pt states she started feeling pain about 1830. Pt states she is contracting q 2-4 min.

## 2010-11-29 NOTE — ED Provider Notes (Signed)
Julie Cummings is a 20 y.o. female presenting for labor eval.  Denies leak or bldg. Reports +FM. Mild H/A. History OB History    Grav Para Term Preterm Abortions TAB SAB Ect Mult Living   2 0 0 0 1 0 1 0 0 0      Past Medical History  Diagnosis Date  . No pertinent past medical history   . Gall stones    Past Surgical History  Procedure Date  . Tonsillectomy    Family History: family history is not on file. Social History:  reports that she has quit smoking. She has never used smokeless tobacco. She reports that she does not drink alcohol or use illicit drugs.  ROS  1+/70/-2 (exam by me 2 hrs apart- unchanged)  Blood pressure 138/77, pulse 80, temperature 97.8 F (36.6 C), temperature source Oral, resp. rate 20, height 5\' 4"  (1.626 m), weight 88.451 kg (195 lb). Maternal Exam:  Uterine Assessment: irreg ctx per toco     Fetal Exam Fetal Monitor Review: Baseline rate: 120.  Pattern: accelerations present and no decelerations.    Fetal State Assessment: Category I - tracings are normal.     Physical Exam  Constitutional: She is oriented to person, place, and time. She appears well-developed and well-nourished.  HENT:  Head: Normocephalic.  Cardiovascular: Normal rate.   Respiratory: Effort normal.  Neurological: She is alert and oriented to person, place, and time.  Skin: Skin is warm and dry.  Psychiatric: She has a normal mood and affect.    Prenatal labs: ABO, Rh: --/--/B NEG, B NEG (04/25 1220) Antibody: NEG (04/25 1220) Rubella: 2.8 (NOTE) Reference Range:                                                     < 5.0 IU/mL         Not Immune                                    5.0 - 9.9 IU/mL       Equivocal          >=10.0 IU/mL        Immune (04/25 1220) RPR: NON REACTIVE (04/25 1220)  HBsAg: NEGATIVE (04/25 1220)  HIV: NON REACTIVE (04/25 1220)  GBS:     Assessment/Plan: IUP at term False labor Labile BPs  PIH labs collected per Dr Marice Potter: all nl incl  pro/cr ratio of 0.12  D/C home with labor precautions Keep next sched visit   Cam Hai 11/29/2010, 9:31 PM

## 2010-11-29 NOTE — Progress Notes (Signed)
On entering the room to given pt discharge instructions pt states "I want to see my doctor" Philipp Deputy CNM informed of pt's request.

## 2010-11-29 NOTE — Progress Notes (Signed)
Pt G2 P0 at 38.5wks having contractions every 2-29min x 1.5hrs.  Pt seen in the office today SVE 2cm/70%.  Pt reports gallstones during pregnancy.

## 2010-11-30 ENCOUNTER — Inpatient Hospital Stay (HOSPITAL_COMMUNITY)
Admission: AD | Admit: 2010-11-30 | Discharge: 2010-11-30 | Disposition: A | Discharge status: Home / Self Care | Payer: Medicaid Other | Source: Ambulatory Visit | Attending: Obstetrics & Gynecology | Admitting: Obstetrics & Gynecology

## 2010-11-30 DIAGNOSIS — O479 False labor, unspecified: Secondary | ICD-10-CM | POA: Insufficient documentation

## 2010-11-30 LAB — PROTEIN / CREATININE RATIO, URINE: Total Protein, Urine: 14.6 mg/dL

## 2010-11-30 MED ORDER — HYDROCODONE-ACETAMINOPHEN 5-500 MG PO TABS: 1.0000 | ORAL_TABLET | ORAL | Status: DC | PRN

## 2010-11-30 MED ORDER — ZOLPIDEM TARTRATE 5 MG PO TABS
5.0000 mg | ORAL_TABLET | Freq: Once | ORAL | Status: AC
  Administered 2010-11-30: 5 mg via ORAL
  Filled 2010-11-30: qty 1

## 2010-11-30 MED ORDER — HYDROCODONE-ACETAMINOPHEN 10-325 MG PO TABS: 1.0000 | ORAL_TABLET | Freq: Once | ORAL | Status: DC

## 2010-11-30 MED ORDER — OXYCODONE-ACETAMINOPHEN 5-325 MG PO TABS: 1.0000 | ORAL_TABLET | Freq: Once | ORAL | Status: DC

## 2010-11-30 NOTE — ED Provider Notes (Signed)
History     Chief Complaint  Patient presents with  . Contractions   HPI This is a 21 year old G2 P0 010 with an intrauterine pregnancy at 38 weeks and 6 days who presents the MAU with contractions that started yesterday after having her membranes stripped in the doctor's office. Her cervix was dilated to approximately 3 cm at that time. Her contractions started after that and increased in intensity. She went to the MAU last night for complaints of contractions. She was released from the MAU without any cervical change. She continued to have abdominal contractions and with her doctor's office the same cervical exam. She now presents approximately 4-5 hours after being at the physician's office complaining of continued contractions of approximately every 2-3 minutes with moderate to severe intensity. She denies decreased fetal activity , leaking fluid, vaginal bleeding, vaginal discharge, abdominal pain, headache, vision changes.  OB History    Grav Para Term Preterm Abortions TAB SAB Ect Mult Living   2 0 0 0 1 0 1 0 0 0       Past Medical History  Diagnosis Date  . No pertinent past medical history   . Gall stones     Past Surgical History  Procedure Date  . Tonsillectomy     No family history on file.  History  Substance Use Topics  . Smoking status: Former Smoker -- .5 years  . Smokeless tobacco: Never Used  . Alcohol Use: No    Allergies: No Known Allergies  Prescriptions prior to admission  Medication Sig Dispense Refill  . naphazoline (CLEAR EYES) 0.012 % ophthalmic solution Place 1 drop into both eyes Nightly. For eye irritation      . oxyCODONE-acetaminophen (PERCOCET) 5-325 MG per tablet Take 1 tablet by mouth every 4 (four) hours as needed.        . ondansetron (ZOFRAN) 8 MG tablet Take 1 tablet (8 mg total) by mouth daily as needed for nausea. For nausea  20 tablet  3  . Pediatric Multiple Vitamins (CHEWABLE MULTIPLE VITAMINS PO) Take 2 tablets by mouth daily.          Review of Systems  All other systems reviewed and are negative.   Physical Exam   Blood pressure 125/77, pulse 86, temperature 97.1 F (36.2 C), temperature source Oral, resp. rate 22, height 5\' 4"  (1.626 m), weight 88.451 kg (195 lb).  Physical Exam  Constitutional: She is oriented to person, place, and time. She appears well-developed and well-nourished.  HENT:  Head: Normocephalic and atraumatic.  Eyes: Pupils are equal, round, and reactive to light.  Neck: Normal range of motion. Neck supple.  Cardiovascular: Normal rate and regular rhythm.   Respiratory: Effort normal and breath sounds normal.  GI: Soft. Bowel sounds are normal. She exhibits no distension and no mass. There is no tenderness. There is no rebound and no guarding.       Fundal height is at term. Estimated fetal weight is approximately 6-1/2 pounds.  Musculoskeletal: Normal range of motion. She exhibits no edema.  Neurological: She is alert and oriented to person, place, and time.  Skin: Skin is warm and dry.   Dilation: 3 Effacement (%): 70 Station: -1 Presentation: Vertex Exam by:: Dr. Adrian Blackwater  Fetal heart tracing shows a category 1 tracing with a baseline in approximately 130 beats per minute. Contractions are visible on tocometry @ every 2-3 minutes.  MAU Course  Procedures  Assessment and Plan  1.  Early Latent Labor  No change  in cervical exam after 1 hour.  Discussed that this is early labor with patient.  Will send her home with ambien and vicodin for pain relief.  Instructed pt to return if having pressure, increased pain with contractions, or if has SROM.  Follow up on Friday with provider if not delivered.  Julie Cummings 11/30/2010, 6:13 PM

## 2010-11-30 NOTE — Progress Notes (Signed)
Pt does not wish to take norco at this time.  Wants to just take ambien and go home and take prescription percocet at home.  ambien given and discharge instructions explained.

## 2010-11-30 NOTE — Progress Notes (Signed)
Pt arrived EMS, states she was in MAU last night, was 1.5 cm's & DC'd home.  Pt was seen @ [facility] today @ 1315, SVE 3 cm's.  Pt states uc's are now more intense, having bloody show, denies LOF.

## 2010-11-30 NOTE — Progress Notes (Addendum)
Lab called to follow up on lab work in progress

## 2010-12-01 ENCOUNTER — Inpatient Hospital Stay (HOSPITAL_COMMUNITY)
Admission: AD | Admit: 2010-12-01 | Discharge: 2010-12-03 | DRG: 774 | Disposition: A | Discharge status: Home / Self Care | Payer: Medicaid Other | Source: Ambulatory Visit | Attending: Obstetrics and Gynecology | Admitting: Obstetrics and Gynecology

## 2010-12-01 ENCOUNTER — Encounter (HOSPITAL_COMMUNITY): Payer: Self-pay | Admitting: *Deleted

## 2010-12-01 DIAGNOSIS — O152 Eclampsia in the puerperium: Secondary | ICD-10-CM | POA: Diagnosis not present

## 2010-12-01 LAB — CBC
MCH: 24.2 pg — ABNORMAL LOW (ref 26.0–34.0)
MCHC: 31.5 g/dL (ref 30.0–36.0)
Platelets: 223 10*3/uL (ref 150–400)
RDW: 14.6 % (ref 11.5–15.5)

## 2010-12-01 LAB — COMPREHENSIVE METABOLIC PANEL
ALT: 7 U/L (ref 0–35)
Albumin: 2.4 g/dL — ABNORMAL LOW (ref 3.5–5.2)
Alkaline Phosphatase: 307 U/L — ABNORMAL HIGH (ref 39–117)
Calcium: 9.1 mg/dL (ref 8.4–10.5)
GFR calc Af Amer: >90 mL/min (ref >90)
Glucose, Bld: 103 mg/dL — ABNORMAL HIGH (ref 70–99)
Potassium: 3.2 mEq/L — ABNORMAL LOW (ref 3.5–5.1)
Sodium: 135 mEq/L (ref 135–145)
Total Protein: 5.9 g/dL — ABNORMAL LOW (ref 6.0–8.3)

## 2010-12-01 LAB — RPR: RPR Ser Ql: NONREACTIVE

## 2010-12-01 LAB — PROTEIN / CREATININE RATIO, URINE: Protein Creatinine Ratio: 0.16 — ABNORMAL HIGH (ref 0.00–0.15)

## 2010-12-01 MED ORDER — PRENATAL PLUS 27-1 MG PO TABS
1.0000 | ORAL_TABLET | Freq: Every day | ORAL | Status: DC
  Administered 2010-12-02 – 2010-12-03 (×2): 1 via ORAL
  Filled 2010-12-01 (×2): qty 1

## 2010-12-01 MED ORDER — IBUPROFEN 600 MG PO TABS
600.0000 mg | ORAL_TABLET | Freq: Four times a day (QID) | ORAL | Status: DC
  Administered 2010-12-01 – 2010-12-03 (×8): 600 mg via ORAL
  Filled 2010-12-01 (×8): qty 1

## 2010-12-01 MED ORDER — BENZOCAINE-MENTHOL 20-0.5 % EX AERO: 1.0000 "application " | INHALATION_SPRAY | CUTANEOUS | Status: DC | PRN

## 2010-12-01 MED ORDER — ZOLPIDEM TARTRATE 5 MG PO TABS: 5.0000 mg | ORAL_TABLET | Freq: Every evening | ORAL | Status: DC | PRN

## 2010-12-01 MED ORDER — MAGNESIUM SULFATE 40 G IN LACTATED RINGERS - SIMPLE
2.0000 g/h | INTRAVENOUS | Status: AC
  Administered 2010-12-01: 2 g/h via INTRAVENOUS
  Administered 2010-12-01: 6 g/h via INTRAVENOUS
  Administered 2010-12-02: 2 g/h via INTRAVENOUS
  Filled 2010-12-01: qty 500

## 2010-12-01 MED ORDER — LIDOCAINE HCL (PF) 1 % IJ SOLN
INTRAMUSCULAR | Status: AC
  Administered 2010-12-01: 30 mL
  Filled 2010-12-01: qty 30

## 2010-12-01 MED ORDER — LACTATED RINGERS IV SOLN
INTRAVENOUS | Status: DC
  Administered 2010-12-01 – 2010-12-02 (×4): via INTRAVENOUS

## 2010-12-01 MED ORDER — DIPHENHYDRAMINE HCL 25 MG PO CAPS: 25.0000 mg | ORAL_CAPSULE | Freq: Four times a day (QID) | ORAL | Status: DC | PRN

## 2010-12-01 MED ORDER — OXYCODONE-ACETAMINOPHEN 5-325 MG PO TABS
1.0000 | ORAL_TABLET | ORAL | Status: DC | PRN
  Administered 2010-12-01: 1 via ORAL
  Filled 2010-12-01: qty 1

## 2010-12-01 MED ORDER — WITCH HAZEL-GLYCERIN EX PADS: 1.0000 "application " | MEDICATED_PAD | CUTANEOUS | Status: DC | PRN

## 2010-12-01 MED ORDER — ONDANSETRON HCL 4 MG PO TABS: 4.0000 mg | ORAL_TABLET | ORAL | Status: DC | PRN

## 2010-12-01 MED ORDER — SIMETHICONE 80 MG PO CHEW: 80.0000 mg | CHEWABLE_TABLET | ORAL | Status: DC | PRN

## 2010-12-01 MED ORDER — DIBUCAINE 1 % RE OINT: 1.0000 "application " | TOPICAL_OINTMENT | RECTAL | Status: DC | PRN

## 2010-12-01 MED ORDER — OXYTOCIN 10 UNIT/ML IJ SOLN
INTRAMUSCULAR | Status: AC
  Administered 2010-12-01: 20 [IU]
  Filled 2010-12-01: qty 2

## 2010-12-01 MED ORDER — ONDANSETRON HCL 4 MG/2ML IJ SOLN: 4.0000 mg | INTRAMUSCULAR | Status: DC | PRN

## 2010-12-01 MED ORDER — LANOLIN HYDROUS EX OINT: TOPICAL_OINTMENT | CUTANEOUS | Status: DC | PRN

## 2010-12-01 MED ORDER — TETANUS-DIPHTH-ACELL PERTUSSIS 5-2.5-18.5 LF-MCG/0.5 IM SUSP
0.5000 mL | Freq: Once | INTRAMUSCULAR | Status: AC
  Administered 2010-12-02: 0.5 mL via INTRAMUSCULAR
  Filled 2010-12-01: qty 0.5

## 2010-12-01 MED ORDER — ERYTHROMYCIN 5 MG/GM OP OINT
TOPICAL_OINTMENT | OPHTHALMIC | Status: AC
  Filled 2010-12-01: qty 1

## 2010-12-01 MED ORDER — SENNOSIDES-DOCUSATE SODIUM 8.6-50 MG PO TABS
2.0000 | ORAL_TABLET | Freq: Every day | ORAL | Status: DC
  Administered 2010-12-01 – 2010-12-02 (×2): 2 via ORAL

## 2010-12-01 MED ORDER — MAGNESIUM SULFATE BOLUS VIA INFUSION
6.0000 g | Freq: Once | INTRAVENOUS | Status: DC
  Filled 2010-12-01: qty 500

## 2010-12-01 NOTE — H&P (Addendum)
Julie Cummings is a 20 y.o. female presenting for vaginal delivery . History This is a 20 year old G2 P0 010 at 39 weeks and 0 days who presents the MAU with contractions. She was seen last night with prodromal latent labor and was sent home with no cervical change. She returned to the MAU by a private vehicle with a bulging bag from the perineum. She had continued to have contractions since last night and felt strong contractions every 2 minutes. The amniotic sac was artificially ruptured with spontaneous delivery to a viable female infant. Please see delivery note for details.  OB History    Grav Para Term Preterm Abortions TAB SAB Ect Mult Living   2 0 0 0 1 0 1 0 0 0      Past Medical History  Diagnosis Date  . No pertinent past medical history   . Gall stones    Past Surgical History  Procedure Date  . Tonsillectomy    Family History: family history is not on file. Social History:  reports that she has quit smoking. She has never used smokeless tobacco. She reports that she does not drink alcohol or use illicit drugs.  Review of Systems  All other systems reviewed and are negative.      Blood pressure 126/68, pulse 117. Exam Physical Exam  Constitutional: She is oriented to person, place, and time. She appears well-developed and well-nourished.  HENT:  Head: Normocephalic and atraumatic.  Neck: Normal range of motion. Neck supple.  Cardiovascular: Normal rate and regular rhythm.   Respiratory: Effort normal and breath sounds normal. No respiratory distress. She has no wheezes. She has no rales. She exhibits no tenderness.  GI: Soft. Bowel sounds are normal. She exhibits no distension and no mass. There is no tenderness. There is no rebound and no guarding.  Genitourinary: Vagina normal.  Musculoskeletal: Normal range of motion.  Neurological: She is alert and oriented to person, place, and time.  Skin: Skin is warm and dry.    Prenatal labs: ABO, Rh: --/--/B NEG, B NEG  (04/25 1220) Antibody: NEG (04/25 1220) Rubella: 2.8 (NOTE) Reference Range:                                                     < 5.0 IU/mL         Not Immune                                    5.0 - 9.9 IU/mL       Equivocal          >=10.0 IU/mL        Immune (04/25 1220) RPR: NON REACTIVE (04/25 1220)  HBsAg: NEGATIVE (04/25 1220)  HIV: NON REACTIVE (04/25 1220)  GBS:     Assessment/Plan: #1 20 year old G2 P0 010 39 weeks and 0 days #2 status post vaginal delivery  We'll admit the patient for routine postpartum care. The patient wishes to breast-feed. The started postpartum diet. [facility] will be the physician for the baby  Julie Cummings 12/01/2010, 7:33 AM

## 2010-12-01 NOTE — Progress Notes (Signed)
PUSHING WITH UC

## 2010-12-01 NOTE — OR Nursing (Signed)
Patient arrived to room 110 at 930am with Darel Hong, RN from MAU. Report received that pt had still not voided since delivery. Darel Hong gave report. Then Receiving RN -S.Patirica Longshore requested pt to attempt to void with RN to accompany to the bathroom prior to getting settled in the bed. Pt agreeable to the plan of care. RN S.Tye Juarez and pt in the bathroom.  Family on the sofa in the room and Darel Hong returned to MAU. After several seconds on the toilet, pt became very pale and reported that she was lightheaded. RN applied cool wash cloth to pts neck. Patient leaned into RN and passed out. Pt was unresponsive and then began posturing arms, hands, feet. RN able to keep pt seated on the toilet and supported pt during this episode. RN pulled emergency light in the bathroom for extra RN assistance. Initiated counting to time the seizure. After 20 seconds shaking and posturing slowed and pt remained slumped in RNs arms. RN reassuring and talking to the pt. Finally pt was able to lift her head and speak slowly that she was ok. 3RNs in the room to assist. Patient cleaned up and assisted on the steady and taken back to bed. MD called to come to see the pt. Dr Natale Milch aware and coming to see the pt.  S.Graham Doukas,RN

## 2010-12-01 NOTE — Progress Notes (Signed)
UR chart review completed.  

## 2010-12-01 NOTE — Progress Notes (Signed)
PT ARRIVED -  REMOVED FROM CAR WITH W/C WITH URGE TO PUSH.  TO RM 6.  BAG OF WATER PRESENTING THROUGH PERINEUM.   DR Adrian Blackwater- VE- COMPLETE.   DR Adrian Blackwater AROM-  [phone]- CLEAR

## 2010-12-01 NOTE — Progress Notes (Signed)
Called to evaluate patient after she passed out and had 20 seconds of posturing witnessed by RN in the bathroom. Patient delivered in MAU earlier today without any pregnancy history of elevated blood pressures or other concerns. She reports having a 6/10 HA for about 2 weeks but denies any vision changes or RUQ pain. She did not take any medication to treat her headaches.  Filed Vitals:   12/01/10 0950  BP: 109/75  Pulse: 106  Temp: 98 F (36.7 C)  Resp: 20    Gen: Pale, awake and alert and oriented, mentation intact Heart Regular rate, no murmur Lungs: CTA B/L Abd: BS, no tenderness, uterus firm and below umbilicus -2 LE: 3++ reflexes B/L with 3 beats clonus B/L feet noted, no edema, pulses intact, strength 5/5 B/L Cranial nerves intact, no facial asymmetry  A/P Postpartum Ecclamptic Seizure, Normal BPs 1. Labs: CMP, CBC, cath protein/creatine ration 2. Transfer to AICU 3. Magnesium Sulfate IV, 6g bolus followed by 2g/hr 4. Continue other routine postpartum care 5. Place foley for strict I's and O's 6. Monitor BP closely and treat as warranted.

## 2010-12-02 MED ORDER — MEASLES, MUMPS & RUBELLA VAC ~~LOC~~ INJ
0.5000 mL | INJECTION | Freq: Once | SUBCUTANEOUS | Status: AC
  Administered 2010-12-02: 0.5 mL via SUBCUTANEOUS
  Filled 2010-12-02: qty 0.5

## 2010-12-02 MED ORDER — RHO D IMMUNE GLOBULIN 1500 UNIT/2ML IJ SOLN
300.0000 ug | Freq: Once | INTRAMUSCULAR | Status: AC
  Administered 2010-12-02: 300 ug via INTRAVENOUS
  Filled 2010-12-02: qty 2

## 2010-12-02 NOTE — Progress Notes (Signed)
Post Partum Day 1 Subjective: No complaints, voiding, tolerating PO, + flatus and denies any further seizure activity.  No headache, visual symptoms or RUQ pain.  Breastfeeeding.  Objective: Blood pressure 110/70, pulse 84, temperature 97.9 F (36.6 C), temperature source Oral, resp. rate 20, height 5\' 4"  (1.626 m), weight 84.641 kg (186 lb 9.6 oz), SpO2 98.00%, unknown if currently breastfeeding. Temp:  [97.8 F (36.6 C)-98.2 F (36.8 C)] 97.9 F (36.6 C) (11/02 0400) Pulse Rate:  [80-120] 84  (11/02 0600) Resp:  [16-20] 20  (11/02 0600) BP: (96-123)/(50-94) 110/70 mmHg (11/02 0600) SpO2:  [96 %-100 %] 98 % (11/02 0600) Weight:  [82.101 kg (181 lb)-88.451 kg (195 lb)] 186 lb 9.6 oz (84.641 kg) (11/02 0500)  Physical Exam:  Gen: Aalert and oriented, mentation intact  Heart Regular rate, no murmur  Lungs: CTA B/L  Abd: BS, no tenderness, uterus firm and below umbilicus -2  LE: 2+ reflexes B/L with no clonus B/L feet noted, no edema, pulses intact, strength 5/5 B/L  Cranial nerves intact, no facial asymmetry  Lochia: appropriate Uterine Fundus: firm DVT Evaluation: No evidence of DVT seen on physical exam. A/P  Postpartum Ecclamptic Seizure, Normal BPs  Results for orders placed during the hospital encounter of 12/01/10 (from the past 24 hour(s))  CBC     Status: Abnormal   Collection Time   12/01/10 10:05 AM      Component Value Range   WBC 27.6 (*) 4.0 - 10.5 (K/uL)   RBC 3.56 (*) 3.87 - 5.11 (MIL/uL)   Hemoglobin 8.6 (*) 12.0 - 15.0 (g/dL)   HCT 16.1 (*) 09.6 - 46.0 (%)   MCV 76.7 (*) 78.0 - 100.0 (fL)   MCH 24.2 (*) 26.0 - 34.0 (pg)   MCHC 31.5  30.0 - 36.0 (g/dL)   RDW 04.5  40.9 - 81.1 (%)   Platelets 223  150 - 400 (K/uL)  COMPREHENSIVE METABOLIC PANEL     Status: Abnormal   Collection Time   12/01/10 10:05 AM      Component Value Range   Sodium 135  135 - 145 (mEq/L)   Potassium 3.2 (*) 3.5 - 5.1 (mEq/L)   Chloride 102  96 - 112 (mEq/L)   CO2 23  19 - 32 (mEq/L)    Glucose, Bld 103 (*) 70 - 99 (mg/dL)   BUN 5 (*) 6 - 23 (mg/dL)   Creatinine, Ser 9.14  0.50 - 1.10 (mg/dL)   Calcium 9.1  8.4 - 78.2 (mg/dL)   Total Protein 5.9 (*) 6.0 - 8.3 (g/dL)   Albumin 2.4 (*) 3.5 - 5.2 (g/dL)   AST 15  0 - 37 (U/L)   ALT 7  0 - 35 (U/L)   Alkaline Phosphatase 307 (*) 39 - 117 (U/L)   Total Bilirubin 1.2  0.3 - 1.2 (mg/dL)   GFR calc non Af Amer >90  >90 (mL/min)   GFR calc Af Amer >90  >90 (mL/min)  RPR     Status: Normal   Collection Time   12/01/10 10:05 AM      Component Value Range   RPR NON REACTIVE  NON REACTIVE   RH IG WORKUP, [facility]     Status: Normal (Preliminary result)   Collection Time   12/01/10 10:05 AM      Component Value Range   Gestational Age(Wks) 39     ABO/RH(D) B NEG     Unit Number [id number]     Blood Component Type RHIG  Unit division 00     Status of Unit ISSUED     Transfusion Status OK TO TRANSFUSE    PROTEIN / CREATININE RATIO, URINE     Status: Abnormal   Collection Time   12/01/10 10:10 AM      Component Value Range   Creatinine, Urine 246.50     Total Protein, Urine 39.2     PROTEIN CREATININE RATIO 0.16 (*) 0.00 - 0.15      Basename 12/01/10 1005 11/29/10 2225  HGB 8.6* 9.3*  HCT 27.3* 29.7*    Assessment/Plan:  Patient with seizure activity postpartum, ?atypical eclamptic seizure. Normal BP and normal labs.  PPD#1, patient and baby stable. Discontinue magnesium sulfate around 11 am, transfer to floor per protocol. Breastfeeding Desires circumcision for her female infant after discharge Contraception Depo vs Nuvaring Routine postpartum care   LOS: 1 day   Aubreyana Saltz A 12/02/2010, 8:07 AM

## 2010-12-03 LAB — RH IG WORKUP (INCLUDES ABO/RH): Gestational Age(Wks): 39

## 2010-12-03 MED ORDER — FERROUS SULFATE 325 (65 FE) MG PO TABS: 325.0000 mg | ORAL_TABLET | Freq: Three times a day (TID) | ORAL | Status: DC

## 2010-12-03 MED ORDER — IBUPROFEN 600 MG PO TABS: 600.0000 mg | ORAL_TABLET | Freq: Four times a day (QID) | ORAL | Status: AC

## 2010-12-03 MED ORDER — DOCUSATE SODIUM 100 MG PO CAPS: 100.0000 mg | ORAL_CAPSULE | Freq: Every day | ORAL | Status: DC | PRN

## 2010-12-03 NOTE — Discharge Summary (Signed)
Obstetric Discharge Summary Reason for Admission: onset of labor Prenatal Procedures: none Intrapartum Procedures: spontaneous vaginal delivery Postpartum Procedures: Magnesium Sulfate for ? postpartum eclamptic seizure Complications-Operative and Postpartum: P.P. eclampsia Hemoglobin  Date Value Range Status  12/01/2010 8.6* 12.0-15.0 (g/dL) Final     HCT  Date Value Range Status  12/01/2010 27.3* 36.0-46.0 (%) Final    Discharge Diagnoses: Term Pregnancy-delivered and postpartum eclampsia  Discharge Information: Date: 12/03/2010 Activity: pelvic rest Diet: routine Medications: Ibuprofen, Colace and Iron Condition: stable Instructions: routine Discharge to: home Follow-up Information    Follow up with [facility] in 6 weeks.   Contact information:   [address] [place] [address]          Newborn Data: Live born female  Birth Weight: 7 lb 7.4 oz (3385 g) APGAR: 9, 9  Home with mother.  Julie Cummings 12/03/2010, 6:46 AM

## 2010-12-03 NOTE — Discharge Summary (Signed)
Diagnosis of PP Eclampsia uncertain, as pt had low pressures, normal labs and no pre-delivery suspicion of preeclampsia when she "fell" from toilet and was witnessed to have possible brief jerky movements.

## 2010-12-03 NOTE — Progress Notes (Signed)
  Post Partum Day 2 Subjective: No complaints, voiding, tolerating PO, + flatus and denies any further seizure activity.  No headache, visual symptoms or RUQ pain.  Breastfeeding without difficulty.  Objective: Blood pressure 116/75, pulse 75, temperature 98.1 F (36.7 C), temperature source Oral, resp. rate 18, height 5\' 4"  (1.626 m), weight 84.641 kg (186 lb 9.6 oz), SpO2 96.00%, unknown if currently breastfeeding. Temp:  [97.9 F (36.6 C)-98.1 F (36.7 C)] 98.1 F (36.7 C) (11/03 0206) Pulse Rate:  [72-94] 75  (11/03 0206) Resp:  [18-20] 18  (11/03 0206) BP: (97-133)/(50-80) 116/75 mmHg (11/03 0206) SpO2:  [96 %] 96 % (11/02 0800)  Physical Exam:  Gen: Aalert and oriented, mentation intact  Abd: BS, no tenderness, uterus firm and below umbilicus -2  Cranial nerves intact, no facial asymmetry  Lochia: appropriate Uterine Fundus: firm DVT Evaluation: No evidence of DVT seen on physical exam.   No results found for this or any previous visit (from the past 24 hour(s)).   Basename 12/01/10 1005  HGB 8.6*  HCT 27.3*    Assessment/Plan:  Patient with seizure activity postpartum, ?atypical eclamptic seizure. Normal BP and normal labs.  PPD#1, patient and baby stable. MgSO4 discontinuted yesterday at 11 AM Breastfeeding Desires circumcision for her female infant after discharge Planning on Depo Provera for contraception D/C home, f/u at [facility] in 6 weeks Routine instructions, precautions rev'd   LOS: 2 days   Marquette Blodgett 12/03/2010, 6:41 AM

## 2010-12-31 HISTORY — PX: CHOLECYSTECTOMY: SHX55

## 2011-04-04 ENCOUNTER — Encounter (HOSPITAL_COMMUNITY): Payer: Self-pay | Admitting: Cardiology

## 2011-04-04 ENCOUNTER — Emergency Department (HOSPITAL_COMMUNITY)
Admission: EM | Admit: 2011-04-04 | Discharge: 2011-04-04 | Disposition: A | Discharge status: Home / Self Care | Payer: No Typology Code available for payment source | Attending: Emergency Medicine | Admitting: Emergency Medicine

## 2011-04-04 DIAGNOSIS — N3090 Cystitis, unspecified without hematuria: Secondary | ICD-10-CM | POA: Insufficient documentation

## 2011-04-04 MED ORDER — NAPROXEN 500 MG PO TABS: 500.0000 mg | ORAL_TABLET | Freq: Two times a day (BID) | ORAL | Status: DC

## 2011-04-04 MED ORDER — HYDROCODONE-ACETAMINOPHEN 5-325 MG PO TABS: 1.0000 | ORAL_TABLET | ORAL | Status: AC | PRN

## 2011-04-04 MED ORDER — CYCLOBENZAPRINE HCL 10 MG PO TABS: 10.0000 mg | ORAL_TABLET | Freq: Two times a day (BID) | ORAL | Status: AC | PRN

## 2011-04-04 NOTE — ED Provider Notes (Signed)
Medical screening examination/treatment/procedure(s) were conducted as a shared visit with non-physician practitioner(s) and myself.  I personally evaluated the patient during the encounter   Konstance Happel A Bethene Hankinson, MD 04/04/11 2005 
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
## 2012-03-16 ENCOUNTER — Encounter (HOSPITAL_COMMUNITY): Payer: Self-pay | Admitting: *Deleted

## 2012-03-16 ENCOUNTER — Inpatient Hospital Stay (HOSPITAL_COMMUNITY)
Admission: AD | Admit: 2012-03-16 | Discharge: 2012-03-16 | Disposition: A | Discharge status: Home / Self Care | Payer: BC Managed Care – PPO | Source: Ambulatory Visit | Attending: Obstetrics & Gynecology | Admitting: Obstetrics & Gynecology

## 2012-03-16 DIAGNOSIS — Z3201 Encounter for pregnancy test, result positive: Secondary | ICD-10-CM

## 2012-03-16 DIAGNOSIS — R42 Dizziness and giddiness: Secondary | ICD-10-CM

## 2012-03-16 DIAGNOSIS — O219 Vomiting of pregnancy, unspecified: Secondary | ICD-10-CM

## 2012-03-16 DIAGNOSIS — O210 Mild hyperemesis gravidarum: Secondary | ICD-10-CM | POA: Insufficient documentation

## 2012-03-16 LAB — URINALYSIS, ROUTINE W REFLEX MICROSCOPIC
Bilirubin Urine: NEGATIVE
Glucose, UA: NEGATIVE mg/dL
Ketones, ur: 15 mg/dL — AB
Leukocytes, UA: NEGATIVE
Nitrite: NEGATIVE
Specific Gravity, Urine: 1.02 (ref 1.005–1.030)
pH: 6.5 (ref 5.0–8.0)

## 2012-03-16 MED ORDER — ONDANSETRON 8 MG PO TBDP
8.0000 mg | ORAL_TABLET | Freq: Once | ORAL | Status: AC
  Administered 2012-03-16: 8 mg via ORAL
  Filled 2012-03-16: qty 1

## 2012-03-16 MED ORDER — PROMETHAZINE HCL 25 MG PO TABS: 12.5000 mg | ORAL_TABLET | Freq: Four times a day (QID) | ORAL | Status: DC | PRN

## 2012-03-16 NOTE — MAU Note (Signed)
Last night felt really dizzy and hot never passed.  Went to bed got up to work felt light headed, went to work.  After lunch felt really light headed again that would not pass.  Has blood sugar checked at work 107 after eating.  BP 130/85.  Works at [employer].  No pain at all

## 2012-03-16 NOTE — MAU Provider Note (Signed)
Chief Complaint: Near Syncope   First Provider Initiated Contact with Patient 03/16/12 1654     SUBJECTIVE HPI: Julie Cummings is a 22 y.o. G3P1011 at [redacted]w[redacted]d by LMP who presents to maternity admissions reporting dizziness and nausea.  She reports 2 episodes of dizziness, both occuring when she was sitting down to eat a meal in the last 24 hours.  She is having some mild nausea but denies vomiting.  She had hyperemesis in her previous pregnancy and is worried that her nausea may get worse.  She reports drinking 2-3 bottles of water/day in last few days.  She denies abdominal or back pain, vaginal bleeding, vaginal itching/burning, urinary symptoms, h/a, or fever/chills.     Past Medical History  Diagnosis Date  . No pertinent past medical history   . Gall stones    Past Surgical History  Procedure Laterality Date  . Tonsillectomy    . Cholecystectomy  12/2010   History   Social History  . Marital Status: Married    Spouse Name: N/A    Number of Children: N/A  . Years of Education: N/A   Occupational History  . Not on file.   Social History Main Topics  . Smoking status: Former Smoker -- .5 years  . Smokeless tobacco: Never Used  . Alcohol Use: No  . Drug Use: No  . Sexually Active: Yes   Other Topics Concern  . Not on file   Social History Narrative  . No narrative on file   No current facility-administered medications on file prior to encounter.   No current outpatient prescriptions on file prior to encounter.   Allergies  Allergen Reactions  . Morphine And Related Other (See Comments)    Racing heart; when patient had gallbladder taken out in December 2012, MD told patient that her heart was racing and not to take Morphine again.    ROS: Pertinent items in HPI  OBJECTIVE Blood pressure 118/57, pulse 97, temperature 97 F (36.1 C), temperature source Oral, resp. rate 18, height 5\' 4"  (1.626 m), weight 88.905 kg (196 lb), last menstrual period 02/10/2012, unknown  if currently breastfeeding. GENERAL: Well-developed, well-nourished female in no acute distress.  HEENT: Normocephalic HEART: normal rate RESP: normal effort ABDOMEN: Soft, non-tender EXTREMITIES: Nontender, no edema NEURO: Alert and oriented SPECULUM EXAM: Deferred BIMANUAL: cervix 0/thick/high; uterus~6 weeks, nontender, no adnexal tenderness or masses  LAB RESULTS Results for orders placed during the hospital encounter of 03/16/12 (from the past 24 hour(s))  URINALYSIS, ROUTINE W REFLEX MICROSCOPIC     Status: Abnormal   Collection Time    03/16/12  3:55 PM      Result Value Range   Color, Urine YELLOW  YELLOW   APPearance CLEAR  CLEAR   Specific Gravity, Urine 1.020  1.005 - 1.030   pH 6.5  5.0 - 8.0   Glucose, UA NEGATIVE  NEGATIVE mg/dL   Hgb urine dipstick NEGATIVE  NEGATIVE   Bilirubin Urine NEGATIVE  NEGATIVE   Ketones, ur 15 (*) NEGATIVE mg/dL   Protein, ur NEGATIVE  NEGATIVE mg/dL   Urobilinogen, UA 0.2  0.0 - 1.0 mg/dL   Nitrite NEGATIVE  NEGATIVE   Leukocytes, UA NEGATIVE  NEGATIVE  POCT PREGNANCY, URINE     Status: Abnormal   Collection Time    03/16/12  4:06 PM      Result Value Range   Preg Test, Ur POSITIVE (*) NEGATIVE    ASSESSMENT 1. Nausea/vomiting in pregnancy   2. Dizziness  3. Pregnancy test positive     PLAN Zofran 8 mg ODT and pitcher of water given in MAU with resolution of symptoms Discharge home  Outpatient U/S ordered for viability/dating Phenergan 12.5 mg Q6 hours PRN nausea sent to pharmacy Pregnancy verification letter given Begin prenatal care as soon as possible, pt given contact information for local practices including [facility] Return to MAU as needed (discussed reasons to return including pain, vaginal bleeding, n/v)    Medication List    STOP taking these medications       HYDROcodone-acetaminophen 5-500 MG per tablet  Commonly known as:  VICODIN     LORazepam 0.5 MG tablet  Commonly known as:  ATIVAN      medroxyPROGESTERone 104 MG/0.65ML injection  Commonly known as:  DEPO-SUBQ PROVERA      TAKE these medications       promethazine 25 MG tablet  Commonly known as:  PHENERGAN  Take 0.5 tablets (12.5 mg total) by mouth every 6 (six) hours as needed for nausea.           Follow-up Information   Follow up with [facility]. (Call prenatal provider of your choice to make appointment.  Return to MAU as needed. )    Contact information:   [address] [place] [address] [phone]      Sharen Counter Certified Nurse-Midwife 03/16/2012  5:39 PM

## 2012-03-18 ENCOUNTER — Other Ambulatory Visit: Payer: Self-pay | Admitting: Advanced Practice Midwife

## 2012-03-18 DIAGNOSIS — O219 Vomiting of pregnancy, unspecified: Secondary | ICD-10-CM

## 2012-03-21 ENCOUNTER — Other Ambulatory Visit: Payer: Self-pay | Admitting: Advanced Practice Midwife

## 2012-03-21 ENCOUNTER — Ambulatory Visit (HOSPITAL_COMMUNITY)
Admission: RE | Admit: 2012-03-21 | Discharge: 2012-03-21 | Disposition: A | Discharge status: Home / Self Care | Payer: Medicaid Other | Source: Ambulatory Visit | Attending: Advanced Practice Midwife | Admitting: Advanced Practice Midwife

## 2012-03-21 ENCOUNTER — Inpatient Hospital Stay (HOSPITAL_COMMUNITY)
Admission: AD | Admit: 2012-03-21 | Discharge: 2012-03-21 | Disposition: A | Discharge status: Home / Self Care | Payer: Medicaid Other | Source: Ambulatory Visit | Attending: Obstetrics & Gynecology | Admitting: Obstetrics & Gynecology

## 2012-03-21 DIAGNOSIS — O210 Mild hyperemesis gravidarum: Secondary | ICD-10-CM

## 2012-03-21 DIAGNOSIS — O3680X0 Pregnancy with inconclusive fetal viability, not applicable or unspecified: Secondary | ICD-10-CM | POA: Insufficient documentation

## 2012-03-21 DIAGNOSIS — O219 Vomiting of pregnancy, unspecified: Secondary | ICD-10-CM

## 2012-03-21 DIAGNOSIS — Z3480 Encounter for supervision of other normal pregnancy, unspecified trimester: Secondary | ICD-10-CM

## 2012-03-21 DIAGNOSIS — Z3689 Encounter for other specified antenatal screening: Secondary | ICD-10-CM | POA: Insufficient documentation

## 2012-03-21 NOTE — MAU Note (Signed)
Here for repeat u/s for viability, denies pain or bleeding.

## 2012-03-21 NOTE — MAU Provider Note (Signed)
[id number] at [redacted]w[redacted]d here for [name] result. Seen 2 d ago for N/V and was scheduled for viability [name] today. No abd pain or bleeding.  Filed Vitals:   03/21/12 1053  BP: 120/64  Pulse: 83  Temp: 98.1 F (36.7 C)  Resp: 16  US shows IUGS [redacted]w[redacted]d  and YS, no fetal pole yet. Gen: Pleasant, NAD Exam deferred  A: G3P1011 at [redacted]w[redacted]d N/V of pregnancy  P: Keep appointment with FT today. AVS on pregnancy precautions

## 2012-04-02 ENCOUNTER — Other Ambulatory Visit (HOSPITAL_COMMUNITY)
Admission: RE | Admit: 2012-04-02 | Discharge: 2012-04-02 | Disposition: A | Discharge status: Home / Self Care | Payer: Medicaid Other | Source: Ambulatory Visit | Attending: Obstetrics and Gynecology | Admitting: Obstetrics and Gynecology

## 2012-04-02 ENCOUNTER — Other Ambulatory Visit: Payer: Self-pay | Admitting: Family Medicine

## 2012-04-02 DIAGNOSIS — Z113 Encounter for screening for infections with a predominantly sexual mode of transmission: Secondary | ICD-10-CM | POA: Insufficient documentation

## 2012-04-02 DIAGNOSIS — Z01419 Encounter for gynecological examination (general) (routine) without abnormal findings: Secondary | ICD-10-CM | POA: Insufficient documentation

## 2012-04-02 LAB — OB RESULTS CONSOLE RPR: RPR: NONREACTIVE

## 2012-04-02 LAB — OB RESULTS CONSOLE VARICELLA ZOSTER ANTIBODY, IGG: Varicella: IMMUNE

## 2012-04-02 LAB — OB RESULTS CONSOLE ANTIBODY SCREEN: Antibody Screen: NEGATIVE

## 2012-04-04 ENCOUNTER — Encounter: Payer: Self-pay | Admitting: *Deleted

## 2012-04-17 ENCOUNTER — Encounter (HOSPITAL_COMMUNITY): Payer: Self-pay

## 2012-04-17 ENCOUNTER — Inpatient Hospital Stay (HOSPITAL_COMMUNITY)
Admission: AD | Admit: 2012-04-17 | Discharge: 2012-04-17 | Disposition: A | Discharge status: Home / Self Care | Payer: BC Managed Care – PPO | Source: Ambulatory Visit | Attending: Family Medicine | Admitting: Family Medicine

## 2012-04-17 DIAGNOSIS — O219 Vomiting of pregnancy, unspecified: Secondary | ICD-10-CM

## 2012-04-17 DIAGNOSIS — R42 Dizziness and giddiness: Secondary | ICD-10-CM | POA: Insufficient documentation

## 2012-04-17 DIAGNOSIS — O210 Mild hyperemesis gravidarum: Secondary | ICD-10-CM | POA: Insufficient documentation

## 2012-04-17 LAB — URINALYSIS, ROUTINE W REFLEX MICROSCOPIC
Leukocytes, UA: NEGATIVE
Nitrite: NEGATIVE
Specific Gravity, Urine: >1.03 — ABNORMAL HIGH (ref 1.005–1.030)
pH: 5.5 (ref 5.0–8.0)

## 2012-04-17 LAB — URINE MICROSCOPIC-ADD ON

## 2012-04-17 MED ORDER — METOCLOPRAMIDE HCL 5 MG/ML IJ SOLN
10.0000 mg | Freq: Once | INTRAMUSCULAR | Status: AC
  Administered 2012-04-17: 10 mg via INTRAVENOUS
  Filled 2012-04-17: qty 2

## 2012-04-17 MED ORDER — PROMETHAZINE HCL 25 MG/ML IJ SOLN
25.0000 mg | Freq: Once | INTRAVENOUS | Status: AC
  Administered 2012-04-17: 25 mg via INTRAVENOUS
  Filled 2012-04-17: qty 1

## 2012-04-17 MED ORDER — METOCLOPRAMIDE HCL 10 MG PO TABS: 10.0000 mg | ORAL_TABLET | Freq: Four times a day (QID) | ORAL | Status: DC

## 2012-04-17 NOTE — MAU Note (Signed)
Chief Complaint:  Emesis   HPI: Julie Cummings is a 22 y.o. G3P1011 patient of [facility] at [redacted]w[redacted]d who presents to maternity admissions reporting vomiting and dizziness. Normally vomits twice daily, but since 0400 has been constantly nauseated and has vomited 10x. Unable to keep water down, has not tried to eat. Starting to feel dizzy. Has prescription for Phenergan 12.5mg  but has not taken in three weeks because "it doesn't work." Had [place] food for dinner last night, no other sick contacts. Has had loose stools x1. No fever, chills, pain, cramps, or vaginal bleeding.    Has history of hyperemesis in previous pregnancy and says Reglan was only thing that worked for her then.     Past Medical History: Past Medical History  Diagnosis Date  . No pertinent past medical history   . Gall stones   . History of UTI     Past obstetric history: OB History   Grav Para Term Preterm Abortions TAB SAB Ect Mult Living   3 1 1  0 1 0 1 0 0 1     # Outc Date GA Lbr Len/2nd Wgt Sex Del Anes PTL Lv   1 TRM 11/12 [redacted]w[redacted]d 36:56 / 00:15 5.621HY([id number]) M SVD Local  Yes   2 SAB            3 CUR               Past Surgical History: Past Surgical History  Procedure Laterality Date  . Tonsillectomy    . Cholecystectomy  12/2010    Family History: Family History  Problem Relation Age of Onset  . Hypertension Sister   . CAD Maternal Grandfather   . Diabetes Maternal Grandfather   . Stroke Maternal Grandfather   . Heart attack Maternal Grandfather   . Diabetes Paternal Grandfather   . Hypertension Other     grandparents    Social History: History  Substance Use Topics  . Smoking status: Former Smoker -- .5 years  . Smokeless tobacco: Never Used  . Alcohol Use: No    Allergies:  Allergies  Allergen Reactions  . Morphine And Related Other (See Comments)    Racing heart; when patient had gallbladder taken out in December 2012, MD told patient that her heart was racing and not to take  Morphine again.    Meds:  Prescriptions prior to admission  Medication Sig Dispense Refill  . Prenatal Vit-Fe Fumarate-FA (PRENATAL MULTIVITAMIN) TABS Take 1 tablet by mouth daily at 12 noon.      . promethazine (PHENERGAN) 25 MG tablet Take 0.5 tablets (12.5 mg total) by mouth every 6 (six) hours as needed for nausea.  30 tablet  2    ROS: Pertinent findings in history of present illness.  Physical Exam  Blood pressure 121/73, pulse 114, temperature 98.1 F (36.7 C), temperature source Oral, resp. rate 16, height 5\' 4"  (1.626 m), weight 87.272 kg (192 lb 6.4 oz), last menstrual period 02/10/2012, SpO2 98.00%. GENERAL: Well-developed, well-nourished female in no mild distress.  HEENT: normocephalic HEART: tachycardic RESP: normal effort ABDOMEN: Soft, non-tender, gravid appropriate for gestational age EXTREMITIES: Nontender, no edema NEURO: alert and oriented      Labs: Results for orders placed during the hospital encounter of 04/17/12 (from the past 24 hour(s))  URINALYSIS, ROUTINE W REFLEX MICROSCOPIC     Status: Abnormal   Collection Time    04/17/12 10:50 AM      Result Value Range   Color, Urine YELLOW  YELLOW   APPearance TURBID (*) CLEAR   Specific Gravity, Urine >1.030 (*) 1.005 - 1.030   pH 5.5  5.0 - 8.0   Glucose, UA NEGATIVE  NEGATIVE mg/dL   Hgb urine dipstick NEGATIVE  NEGATIVE   Bilirubin Urine SMALL (*) NEGATIVE   Ketones, ur 15 (*) NEGATIVE mg/dL   Protein, ur 30 (*) NEGATIVE mg/dL   Urobilinogen, UA 0.2  0.0 - 1.0 mg/dL   Nitrite NEGATIVE  NEGATIVE   Leukocytes, UA NEGATIVE  NEGATIVE  URINE MICROSCOPIC-ADD ON     Status: Abnormal   Collection Time    04/17/12 10:50 AM      Result Value Range   Squamous Epithelial / LPF FEW (*) RARE   Bacteria, UA FEW (*) RARE   Urine-Other AMORPHOUS URATES/PHOSPHATES        ED Course  Given 1 liter IVF and Phenergan 25mg  IV and Reglan 10mg  IV with relief of symptoms  Assessment: 1. Nausea and vomiting of  pregnancy   Plan: Discharge home Prescription given for Reglan Keep appointment at [facility] on 05/02/12 F/u as needed     Medication List    ASK your doctor about these medications       prenatal multivitamin Tabs  Take 1 tablet by mouth daily at 12 noon.     promethazine 25 MG tablet  Commonly known as:  PHENERGAN  Take 0.5 tablets (12.5 mg total) by mouth every 6 (six) hours as needed for nausea.        Wilber Oliphant, student midwife I have supervised this student and agree with exam and documentation, Nolene Bernheim RN, FNP 04/17/2012 11:21 AM

## 2012-04-17 NOTE — MAU Note (Signed)
Patient states she has had nausea and vomiting on and off but constant since 0400. Starting to feel dizzy. Denies pain, bleeding or discharge.

## 2012-05-02 ENCOUNTER — Ambulatory Visit (INDEPENDENT_AMBULATORY_CARE_PROVIDER_SITE_OTHER): Payer: BC Managed Care – PPO | Admitting: Obstetrics & Gynecology

## 2012-05-02 ENCOUNTER — Encounter: Payer: Self-pay | Admitting: Advanced Practice Midwife

## 2012-05-02 ENCOUNTER — Telehealth: Payer: Self-pay | Admitting: Obstetrics & Gynecology

## 2012-05-02 VITALS — BP 110/76 | Wt 193.2 lb

## 2012-05-02 DIAGNOSIS — Z331 Pregnant state, incidental: Secondary | ICD-10-CM

## 2012-05-02 DIAGNOSIS — Z3480 Encounter for supervision of other normal pregnancy, unspecified trimester: Secondary | ICD-10-CM | POA: Insufficient documentation

## 2012-05-02 DIAGNOSIS — Z6791 Unspecified blood type, Rh negative: Secondary | ICD-10-CM | POA: Insufficient documentation

## 2012-05-02 DIAGNOSIS — Z1389 Encounter for screening for other disorder: Secondary | ICD-10-CM

## 2012-05-02 DIAGNOSIS — Z3481 Encounter for supervision of other normal pregnancy, first trimester: Secondary | ICD-10-CM

## 2012-05-02 DIAGNOSIS — O360111 Maternal care for anti-D [Rh] antibodies, first trimester, fetus 1: Secondary | ICD-10-CM

## 2012-05-02 DIAGNOSIS — O360990 Maternal care for other rhesus isoimmunization, unspecified trimester, not applicable or unspecified: Secondary | ICD-10-CM

## 2012-05-02 DIAGNOSIS — O09299 Supervision of pregnancy with other poor reproductive or obstetric history, unspecified trimester: Secondary | ICD-10-CM

## 2012-05-02 DIAGNOSIS — O26899 Other specified pregnancy related conditions, unspecified trimester: Secondary | ICD-10-CM | POA: Insufficient documentation

## 2012-05-02 LAB — POCT URINALYSIS DIPSTICK
Blood, UA: NEGATIVE
Nitrite, UA: NEGATIVE

## 2012-05-02 MED ORDER — METOCLOPRAMIDE HCL 10 MG PO TABS: 10.0000 mg | ORAL_TABLET | Freq: Four times a day (QID) | ORAL | Status: DC

## 2012-05-02 NOTE — Progress Notes (Signed)
Reglan working well, no dysuria today (only happened a few times).  Will also try Unisom/B6 at night  Routine questions about pregnancy answered.  F/U in 4 weeks for LROB.

## 2012-05-02 NOTE — Progress Notes (Signed)
Occ has discomfort at the end of urination.

## 2012-05-30 ENCOUNTER — Encounter: Payer: Self-pay | Admitting: Obstetrics & Gynecology

## 2012-05-30 ENCOUNTER — Ambulatory Visit (INDEPENDENT_AMBULATORY_CARE_PROVIDER_SITE_OTHER): Payer: BC Managed Care – PPO | Admitting: Obstetrics & Gynecology

## 2012-05-30 VITALS — BP 110/70 | Wt 190.0 lb

## 2012-05-30 DIAGNOSIS — O2390 Unspecified genitourinary tract infection in pregnancy, unspecified trimester: Secondary | ICD-10-CM

## 2012-05-30 DIAGNOSIS — O09299 Supervision of pregnancy with other poor reproductive or obstetric history, unspecified trimester: Secondary | ICD-10-CM

## 2012-05-30 DIAGNOSIS — O360990 Maternal care for other rhesus isoimmunization, unspecified trimester, not applicable or unspecified: Secondary | ICD-10-CM

## 2012-05-30 DIAGNOSIS — O360121 Maternal care for anti-D [Rh] antibodies, second trimester, fetus 1: Secondary | ICD-10-CM

## 2012-05-30 DIAGNOSIS — Z331 Pregnant state, incidental: Secondary | ICD-10-CM

## 2012-05-30 DIAGNOSIS — Z1389 Encounter for screening for other disorder: Secondary | ICD-10-CM

## 2012-05-30 DIAGNOSIS — Z3482 Encounter for supervision of other normal pregnancy, second trimester: Secondary | ICD-10-CM

## 2012-05-30 LAB — POCT URINALYSIS DIPSTICK
Blood, UA: NEGATIVE
Ketones, UA: NEGATIVE

## 2012-05-30 NOTE — Progress Notes (Signed)
BP weight and urine results all reviewed and noted. Patient reports good fetal movement, denies any bleeding and no rupture of membranes symptoms or regular contractions. Patient is without complaints. All questions were answered. Discharge is normal no yeast or BV, probbly just cervical mucous

## 2012-05-30 NOTE — Patient Instructions (Signed)
How a Baby Grows During Pregnancy Pregnancy begins when the female's sperm enters the female's egg. This happens in the fallopian tube and is called fertilization. The fertilized egg is called an embryo until it reaches 9 weeks from the time of fertilization. From 9 weeks until birth it is called a fetus. The fertilized egg moves down the tube into the uterus and attaches to the inside lining of the uterus.  The pregnant woman is responsible for the growth of the embryo/fetus by supplying nourishment and oxygen through the blood stream and placenta to the developing fetus. The uterus becomes larger and pops out from the abdomen more and more as the fetus develops and grows. A normal pregnancy lasts 280 days, with a range of 259 to 294 days, or 40 weeks. The pregnancy is divided up into three trimesters:  First trimester - 0 to 13 weeks.  Second trimester - 14 to 27 weeks.  Third trimester - 28 to 40 weeks. The day your baby is supposed to be born is called estimated date of confinement (EDC) or estimated date of delivery (EDD). GROWTH OF THE BABY MONTH BY MONTH 1. First Month: The fertilized egg attaches to the inside of the uterus and certain cells will form the placenta and others will develop into the fetus. The arms, legs, brain, spinal cord, lungs, and heart begin to develop. At the end of the first month the heart begins to beat. The embryo weighs less than an ounce and is  inch long. 2. Second Month: The bones can be seen, the inner ear, eye lids, hands and feet form and genitals develop. By the end of 8 weeks, all of the major organs are developing. The fetus now weighs less than an ounce and is one inch (2.54 cm) long. 3. Third Month: Teeth buds appear, all the internal organs are forming, bones and muscles begin to grow, the spine can flex and the skin is transparent. Finger and toe nails begin to form, the hands develop faster than the feet and the arms are longer than the legs at this point.  The fetus weighs a little more than an ounce (0.03 kg) and is 3 inches (8.89cm) long. 4. Fourth Month: The placenta is completely formed. The external sex organs, neck, outer ear, eyebrows, eyelids and fingernails are formed. The fetus can hear, swallow, flex its arms and legs and the kidney begins to produce urine. The skin is covered with a white waxy coating (vernix) and very thin hair (lanugo) is present. The fetus weighs 5 ounces (0.14kg) and is 6 to 7 inches (16.51cm) long. 5. Fifth Month: The fetus moves around more and can be felt for the first time (called quickening), sleeps and wakes up at times, may begin to suck its finger and the nails grow to the end of the fingers. The gallbladder is now functioning and helps to digest the nutrients, eggs are formed in the female and the testicles begin to drop down from the abdomen to the scrotum in the female. The fetus weighs  to 1 pound (0.45kg) and is 10 inches (25.4cm) long. 6. Sixth Month: The lungs are formed but the fetus does not breath yet. The eyes open, the brain develops more quickly at this time, one can detect finger and toe prints and thicker hair grows. The fetus weighs 1 to 1 pounds (0.68kg) and is 12 inches (30.48cm) long. 7. Seventh Month: The fetus can hear and respond to sounds, kicks and stretches and can sense   changes in light. The fetus weighs 2 to 2 pounds (1.13kg) and is 14 inches (35.56cm) long. 8. Eight Month: All organs and body systems are fully developed and functioning. The bones get harder, taste buds develop and can taste sweet and sour flavors and the fetus may hiccup now. Different parts of the brain are developing and the skull remains soft for the brain to grow. The fetus weighs 5 pounds (2.27kg) and is 18 inches (45.75cm) long. 9. Ninth Month: The fetus gains about a half a pound a week, the lungs are fully developed, patterns of sleep develop and the head moves down into the bottom of the uterus called vertex. If the  buttocks moves into the bottom of the uterus, it is called a breech. The fetus weighs 6 to 9 pounds (2.72 to 4.08kg) and is 20 inches (50.8cm) long. You should be informed about your pregnancy, yourself and how the baby is developing as much as possible. Being informed helps you to enjoy this experience. It also gives you the sense to feel if something is not going right and when to ask questions. Talk to your caregiver when you have questions about your baby or your own body. Document Released: 07/05/2007 Document Revised: 04/10/2011 Document Reviewed: 07/05/2007 ExitCare Patient Information 2013 [employer].  

## 2012-05-30 NOTE — Progress Notes (Signed)
Cramping off and on. Vaginal discharge.

## 2012-06-03 ENCOUNTER — Telehealth: Payer: Self-pay | Admitting: Obstetrics & Gynecology

## 2012-06-03 MED ORDER — PROMETHAZINE HCL 25 MG PO TABS: 12.5000 mg | ORAL_TABLET | Freq: Four times a day (QID) | ORAL | Status: DC | PRN

## 2012-06-03 NOTE — Telephone Encounter (Signed)
Pt requesting refill on Phenergan. Uses [facility] on [address] now.

## 2012-06-03 NOTE — Telephone Encounter (Signed)
Pt takes Reglan for nausea, Phenergan don't help. Phenergan was e prescribed but pt wants Reglan. Uses [facility] [address] now.

## 2012-06-04 ENCOUNTER — Other Ambulatory Visit: Payer: Self-pay | Admitting: Obstetrics & Gynecology

## 2012-06-06 NOTE — Telephone Encounter (Signed)
Spoke with pt. She states she did get the Reglan.

## 2012-07-05 ENCOUNTER — Encounter: Payer: Self-pay | Admitting: Obstetrics & Gynecology

## 2012-07-05 ENCOUNTER — Ambulatory Visit (INDEPENDENT_AMBULATORY_CARE_PROVIDER_SITE_OTHER): Payer: BC Managed Care – PPO

## 2012-07-05 ENCOUNTER — Ambulatory Visit (INDEPENDENT_AMBULATORY_CARE_PROVIDER_SITE_OTHER): Payer: BC Managed Care – PPO | Admitting: Obstetrics & Gynecology

## 2012-07-05 ENCOUNTER — Other Ambulatory Visit: Payer: Self-pay | Admitting: Obstetrics & Gynecology

## 2012-07-05 VITALS — BP 100/70 | Wt 194.0 lb

## 2012-07-05 DIAGNOSIS — O09299 Supervision of pregnancy with other poor reproductive or obstetric history, unspecified trimester: Secondary | ICD-10-CM

## 2012-07-05 DIAGNOSIS — O360990 Maternal care for other rhesus isoimmunization, unspecified trimester, not applicable or unspecified: Secondary | ICD-10-CM

## 2012-07-05 DIAGNOSIS — Z331 Pregnant state, incidental: Secondary | ICD-10-CM

## 2012-07-05 DIAGNOSIS — Z3482 Encounter for supervision of other normal pregnancy, second trimester: Secondary | ICD-10-CM

## 2012-07-05 DIAGNOSIS — Z3689 Encounter for other specified antenatal screening: Secondary | ICD-10-CM

## 2012-07-05 DIAGNOSIS — Z1389 Encounter for screening for other disorder: Secondary | ICD-10-CM

## 2012-07-05 LAB — POCT URINALYSIS DIPSTICK
Ketones, UA: NEGATIVE
Leukocytes, UA: NEGATIVE
Protein, UA: NEGATIVE

## 2012-07-05 MED ORDER — METOCLOPRAMIDE HCL 10 MG PO TABS: 10.0000 mg | ORAL_TABLET | Freq: Three times a day (TID) | ORAL | Status: DC

## 2012-07-05 NOTE — Progress Notes (Signed)
Sonogram reviewed.  Normal see full report BP weight and urine results all reviewed and noted. Patient reports good fetal movement, denies any bleeding and no rupture of membranes symptoms or regular contractions. Patient is without complaints. All questions were answered.

## 2012-07-05 NOTE — Addendum Note (Signed)
Addended by: Lazaro Arms on: 07/05/2012 10:23 AM   Modules accepted: Orders

## 2012-07-05 NOTE — Progress Notes (Signed)
U/s performed.Anat. Screen complete, meas. C/w dates,active, cx closed @ 4.2 cm, fluid nml, ant.plac. High, efw@ 12 oz., pt does not want to know fetal gender

## 2012-07-23 ENCOUNTER — Telehealth: Payer: Self-pay | Admitting: Obstetrics and Gynecology

## 2012-07-23 NOTE — Telephone Encounter (Signed)
Pt states having head cold, no fever. What can she take? Pt informed can take OTC Robitussin, Tylenol. If pt thinks symptoms are allergy related, can take zyrtec or Claritin. If no improvement, pt to call office back. Pt asked could she take Aleve D, pt informed per Cyril Mourning, NP can not take Aleve D during pregnancy. Pt verbalized understanding.

## 2012-08-01 ENCOUNTER — Ambulatory Visit (INDEPENDENT_AMBULATORY_CARE_PROVIDER_SITE_OTHER): Payer: BC Managed Care – PPO | Admitting: Obstetrics and Gynecology

## 2012-08-01 VITALS — BP 116/60 | Wt 202.6 lb

## 2012-08-01 DIAGNOSIS — Z331 Pregnant state, incidental: Secondary | ICD-10-CM

## 2012-08-01 DIAGNOSIS — Z3493 Encounter for supervision of normal pregnancy, unspecified, third trimester: Secondary | ICD-10-CM

## 2012-08-01 DIAGNOSIS — Z1389 Encounter for screening for other disorder: Secondary | ICD-10-CM

## 2012-08-01 DIAGNOSIS — O09299 Supervision of pregnancy with other poor reproductive or obstetric history, unspecified trimester: Secondary | ICD-10-CM

## 2012-08-01 DIAGNOSIS — Z3480 Encounter for supervision of other normal pregnancy, unspecified trimester: Secondary | ICD-10-CM

## 2012-08-01 DIAGNOSIS — O360990 Maternal care for other rhesus isoimmunization, unspecified trimester, not applicable or unspecified: Secondary | ICD-10-CM

## 2012-08-01 LAB — POCT URINALYSIS DIPSTICK
Glucose, UA: NEGATIVE
Ketones, UA: NEGATIVE
Nitrite, UA: NEGATIVE

## 2012-08-01 MED ORDER — OMEPRAZOLE 40 MG PO CPDR: 40.0000 mg | DELAYED_RELEASE_CAPSULE | Freq: Every day | ORAL | Status: DC

## 2012-08-01 NOTE — Patient Instructions (Signed)
Fetal Movement Counts Patient Name: __________________________________________________ Patient Due Date: ____________________ Performing a fetal movement count is highly recommended in high-risk pregnancies, but it is good for every pregnant woman to do. Your caregiver may ask you to start counting fetal movements at 28 weeks of the pregnancy. Fetal movements often increase:  After eating a full meal.  After physical activity.  After eating or drinking something sweet or cold.  At rest. Pay attention to when you feel the baby is most active. This will help you notice a pattern of your baby's sleep and wake cycles and what factors contribute to an increase in fetal movement. It is important to perform a fetal movement count at the same time each day when your baby is normally most active.  HOW TO COUNT FETAL MOVEMENTS 1. Find a quiet and comfortable area to sit or lie down on your left side. Lying on your left side provides the best blood and oxygen circulation to your baby. 2. Write down the day and time on a sheet of paper or in a journal. 3. Start counting kicks, flutters, swishes, rolls, or jabs in a 2 hour period. You should feel at least 10 movements within 2 hours. 4. If you do not feel 10 movements in 2 hours, wait 2 3 hours and count again. Look for a change in the pattern or not enough counts in 2 hours. SEEK MEDICAL CARE IF:  You feel less than 10 counts in 2 hours, tried twice.  There is no movement in over an hour.  The pattern is changing or taking longer each day to reach 10 counts in 2 hours.  You feel the baby is not moving as he or she usually does. Date: ____________ Movements: ____________ Start time: ____________ Finish time: ____________  Date: ____________ Movements: ____________ Start time: ____________ Finish time: ____________ Date: ____________ Movements: ____________ Start time: ____________ Finish time: ____________ Date: ____________ Movements: ____________  Start time: ____________ Finish time: ____________ Date: ____________ Movements: ____________ Start time: ____________ Finish time: ____________ Date: ____________ Movements: ____________ Start time: ____________ Finish time: ____________ Date: ____________ Movements: ____________ Start time: ____________ Finish time: ____________ Date: ____________ Movements: ____________ Start time: ____________ Finish time: ____________  Date: ____________ Movements: ____________ Start time: ____________ Finish time: ____________ Date: ____________ Movements: ____________ Start time: ____________ Finish time: ____________ Date: ____________ Movements: ____________ Start time: ____________ Finish time: ____________ Date: ____________ Movements: ____________ Start time: ____________ Finish time: ____________ Date: ____________ Movements: ____________ Start time: ____________ Finish time: ____________ Date: ____________ Movements: ____________ Start time: ____________ Finish time: ____________ Date: ____________ Movements: ____________ Start time: ____________ Finish time: ____________  Date: ____________ Movements: ____________ Start time: ____________ Finish time: ____________ Date: ____________ Movements: ____________ Start time: ____________ Finish time: ____________ Date: ____________ Movements: ____________ Start time: ____________ Finish time: ____________ Date: ____________ Movements: ____________ Start time: ____________ Finish time: ____________ Date: ____________ Movements: ____________ Start time: ____________ Finish time: ____________ Date: ____________ Movements: ____________ Start time: ____________ Finish time: ____________ Date: ____________ Movements: ____________ Start time: ____________ Finish time: ____________  Date: ____________ Movements: ____________ Start time: ____________ Finish time: ____________ Date: ____________ Movements: ____________ Start time: ____________ Finish time:  ____________ Date: ____________ Movements: ____________ Start time: ____________ Finish time: ____________ Date: ____________ Movements: ____________ Start time: ____________ Finish time: ____________ Date: ____________ Movements: ____________ Start time: ____________ Finish time: ____________ Date: ____________ Movements: ____________ Start time: ____________ Finish time: ____________ Date: ____________ Movements: ____________ Start time: ____________ Finish time: ____________  Date: ____________ Movements: ____________ Start time: ____________ Finish   time: ____________ Date: ____________ Movements: ____________ Start time: ____________ Finish time: ____________ Date: ____________ Movements: ____________ Start time: ____________ Finish time: ____________ Date: ____________ Movements: ____________ Start time: ____________ Finish time: ____________ Date: ____________ Movements: ____________ Start time: ____________ Finish time: ____________ Date: ____________ Movements: ____________ Start time: ____________ Finish time: ____________ Date: ____________ Movements: ____________ Start time: ____________ Finish time: ____________  Date: ____________ Movements: ____________ Start time: ____________ Finish time: ____________ Date: ____________ Movements: ____________ Start time: ____________ Finish time: ____________ Date: ____________ Movements: ____________ Start time: ____________ Finish time: ____________ Date: ____________ Movements: ____________ Start time: ____________ Finish time: ____________ Date: ____________ Movements: ____________ Start time: ____________ Finish time: ____________ Date: ____________ Movements: ____________ Start time: ____________ Finish time: ____________ Date: ____________ Movements: ____________ Start time: ____________ Finish time: ____________  Date: ____________ Movements: ____________ Start time: ____________ Finish time: ____________ Date: ____________ Movements:  ____________ Start time: ____________ Finish time: ____________ Date: ____________ Movements: ____________ Start time: ____________ Finish time: ____________ Date: ____________ Movements: ____________ Start time: ____________ Finish time: ____________ Date: ____________ Movements: ____________ Start time: ____________ Finish time: ____________ Date: ____________ Movements: ____________ Start time: ____________ Finish time: ____________ Date: ____________ Movements: ____________ Start time: ____________ Finish time: ____________  Date: ____________ Movements: ____________ Start time: ____________ Finish time: ____________ Date: ____________ Movements: ____________ Start time: ____________ Finish time: ____________ Date: ____________ Movements: ____________ Start time: ____________ Finish time: ____________ Date: ____________ Movements: ____________ Start time: ____________ Finish time: ____________ Date: ____________ Movements: ____________ Start time: ____________ Finish time: ____________ Date: ____________ Movements: ____________ Start time: ____________ Finish time: ____________ Document Released: 02/15/2006 Document Revised: 01/03/2012 Document Reviewed: 11/13/2011 [employer] Patient Information 2014 [employer].  

## 2012-08-01 NOTE — Progress Notes (Signed)
Pt states having "alot of vaginal pressure" also c/o heartburn. Good fm.   Will Rx: omeprazole 40 mg qd

## 2012-09-03 ENCOUNTER — Encounter: Payer: Self-pay | Admitting: Obstetrics & Gynecology

## 2012-09-03 ENCOUNTER — Ambulatory Visit (INDEPENDENT_AMBULATORY_CARE_PROVIDER_SITE_OTHER): Payer: BC Managed Care – PPO | Admitting: Obstetrics & Gynecology

## 2012-09-03 ENCOUNTER — Other Ambulatory Visit: Payer: BC Managed Care – PPO

## 2012-09-03 VITALS — BP 110/60 | Wt 211.0 lb

## 2012-09-03 DIAGNOSIS — Z331 Pregnant state, incidental: Secondary | ICD-10-CM

## 2012-09-03 DIAGNOSIS — O09299 Supervision of pregnancy with other poor reproductive or obstetric history, unspecified trimester: Secondary | ICD-10-CM

## 2012-09-03 DIAGNOSIS — Z3483 Encounter for supervision of other normal pregnancy, third trimester: Secondary | ICD-10-CM

## 2012-09-03 DIAGNOSIS — O360990 Maternal care for other rhesus isoimmunization, unspecified trimester, not applicable or unspecified: Secondary | ICD-10-CM

## 2012-09-03 DIAGNOSIS — Z1389 Encounter for screening for other disorder: Secondary | ICD-10-CM

## 2012-09-03 LAB — POCT URINALYSIS DIPSTICK
Blood, UA: NEGATIVE
Ketones, UA: NEGATIVE
Leukocytes, UA: NEGATIVE

## 2012-09-03 LAB — CBC
HCT: 34 % — ABNORMAL LOW (ref 36.0–46.0)
Hemoglobin: 11.5 g/dL — ABNORMAL LOW (ref 12.0–15.0)
MCH: 29.8 pg (ref 26.0–34.0)
MCHC: 33.8 g/dL (ref 30.0–36.0)
RDW: 12.7 % (ref 11.5–15.5)

## 2012-09-03 NOTE — Progress Notes (Signed)
BP weight and urine results all reviewed and noted. Patient reports good fetal movement, denies any bleeding and no rupture of membranes symptoms or regular contractions. Patient is without complaints. All questions were answered.  

## 2012-09-03 NOTE — Progress Notes (Deleted)
FOR PN2 TODAY. 

## 2012-09-03 NOTE — Patient Instructions (Signed)
Breastfeeding A change in hormones during your pregnancy causes growth of your breast tissue and an increase in number and size of milk ducts. The hormone prolactin allows proteins, sugars, and fats from your blood supply to make breast milk in your milk-producing glands. The hormone progesterone prevents breast milk from being released before the birth of your baby. After the birth of your baby, your progesterone level decreases allowing breast milk to be released. Thoughts of your baby, as well as his or her sucking or crying, can stimulate the release of milk from the milk-producing glands. Deciding to breastfeed (nurse) is one of the best choices you can make for you and your baby. The information that follows gives a brief review of the benefits, as well as other important skills to know about breastfeeding. BENEFITS OF BREASTFEEDING For your baby  The first milk (colostrum) helps your baby's digestive system function better.   There are antibodies in your milk that help your baby fight off infections.   Your baby has a lower incidence of asthma, allergies, and sudden infant death syndrome (SIDS).   The nutrients in breast milk are better for your baby than infant formulas.  Breast milk improves your baby's brain development.   Your baby will have less gas, colic, and constipation.  Your baby is less likely to develop other conditions, such as childhood obesity, asthma, or diabetes mellitus. For you  Breastfeeding helps develop a very special bond between you and your baby.   Breastfeeding is convenient, always available at the correct temperature, and costs nothing.   Breastfeeding helps to burn calories and helps you lose the weight gained during pregnancy.   Breastfeeding makes your uterus contract back down to normal size faster and slows bleeding following delivery.   Breastfeeding mothers have a lower risk of developing osteoporosis or breast or ovarian cancer later  in life.  BREASTFEEDING FREQUENCY  A healthy, full-term baby may breastfeed as often as every hour or space his or her feedings to every 3 hours. Breastfeeding frequency will vary from baby to baby.   Newborns should be fed no less than every 2 3 hours during the day and every 4 5 hours during the night. You should breastfeed a minimum of 8 feedings in a 24 hour period.  Awaken your baby to breastfeed if it has been 3 4 hours since the last feeding.  Breastfeed when you feel the need to reduce the fullness of your breasts or when your newborn shows signs of hunger. Signs that your baby may be hungry include:  Increased alertness or activity.  Stretching.  Movement of the head from side to side.  Movement of the head and opening of the mouth when the corner of the mouth or cheek is stroked (rooting).  Increased sucking sounds, smacking lips, cooing, sighing, or squeaking.  Hand-to-mouth movements.  Increased sucking of fingers or hands.  Fussing.  Intermittent crying.  Signs of extreme hunger will require calming and consoling before you try to feed your baby. Signs of extreme hunger may include:  Restlessness.  A loud, strong cry.  Screaming.  Frequent feeding will help you make more milk and will help prevent problems, such as sore nipples and engorgement of the breasts.  BREASTFEEDING   Whether lying down or sitting, be sure that the baby's abdomen is facing your abdomen.   Support your breast with 4 fingers under your breast and your thumb above your nipple. Make sure your fingers are well away from   your nipple and your baby's mouth.   Stroke your baby's lips gently with your finger or nipple.   When your baby's mouth is open wide enough, place all of your nipple and as much of the colored area around your nipple (areola) as possible into your baby's mouth.  More areola should be visible above his or her upper lip than below his or her lower lip.  Your  baby's tongue should be between his or her lower gum and your breast.  Ensure that your baby's mouth is correctly positioned around the nipple (latched). Your baby's lips should create a seal on your breast.  Signs that your baby has effectively latched onto your nipple include:  Tugging or sucking without pain.  Swallowing heard between sucks.  Absent click or smacking sound.  Muscle movement above and in front of his or her ears with sucking.  Your baby must suck about 2 3 minutes in order to get your milk. Allow your baby to feed on each breast as long as he or she wants. Nurse your baby until he or she unlatches or falls asleep at the first breast, then offer the second breast.  Signs that your baby is full and satisfied include:  A gradual decrease in the number of sucks or complete cessation of sucking.  Falling asleep.  Extension or relaxation of his or her body.  Retention of a small amount of milk in his or her mouth.  Letting go of your breast by himself or herself.  Signs of effective breastfeeding in you include:  Breasts that have increased firmness, weight, and size prior to feeding.  Breasts that are softer after nursing.  Increased milk volume, as well as a change in milk consistency and color by the 5th day of breastfeeding.  Breast fullness relieved by breastfeeding.  Nipples are not sore, cracked, or bleeding.  If needed, break the suction by putting your finger into the corner of your baby's mouth and sliding your finger between his or her gums. Then, remove your breast from his or her mouth.  It is common for babies to spit up a small amount after a feeding.  Babies often swallow air during feeding. This can make babies fussy. Burping your baby between breasts can help with this.  Vitamin D supplements are recommended for babies who get only breast milk.  Avoid using a pacifier during your baby's first 4 6 weeks.  Avoid supplemental feedings of  water, formula, or juice in place of breastfeeding. Breast milk is all the food your baby needs. It is not necessary for your baby to have water or formula. Your breasts will make more milk if supplemental feedings are avoided during the early weeks. HOW TO TELL WHETHER YOUR BABY IS GETTING ENOUGH BREAST MILK Wondering whether or not your baby is getting enough milk is a common concern among mothers. You can be assured that your baby is getting enough milk if:   Your baby is actively sucking and you hear swallowing.   Your baby seems relaxed and satisfied after a feeding.   Your baby nurses at least 8 12 times in a 24 hour time period.  During the first 3 5 days of age:  Your baby is wetting at least 3 5 diapers in a 24 hour period. The urine should be clear and pale yellow.  Your baby is having at least 3 4 stools in a 24 hour period. The stool should be soft and yellow.  At   5 7 days of age, your baby is having at least 3 6 stools in a 24 hour period. The stool should be seedy and yellow by 5 days of age.  Your baby has a weight loss less than 7 10% during the first 3 days of age.  Your baby does not lose weight after 3 7 days of age.  Your baby gains 4 7 ounces each week after he or she is 4 days of age.  Your baby gains weight by 5 days of age and is back to birth weight within 2 weeks. ENGORGEMENT In the first week after your baby is born, you may experience extremely full breasts (engorgement). When engorged, your breasts may feel heavy, warm, or tender to the touch. Engorgement peaks within 24 48 hours after delivery of your baby.  Engorgement may be reduced by:  Continuing to breastfeed.  Increasing the frequency of breastfeeding.  Taking warm showers or applying warm, moist heat to your breasts just before each feeding. This increases circulation and helps the milk flow.   Gently massaging your breast before and during the feedings. With your fingertips, massage from  your chest wall towards your nipple in a circular motion.   Ensuring that your baby empties at least one breast at every feeding. It also helps to start the next feeding on the opposite breast.   Expressing breast milk by hand or by using a breast pump to empty the breasts if your baby is sleepy, or not nursing well. You may also want to express milk if you are returning to work oryou feel you are getting engorged.  Ensuring your baby is latched on and positioned properly while breastfeeding. If you follow these suggestions, your engorgement should improve in 24 48 hours. If you are still experiencing difficulty, call your lactation consultant or caregiver.  CARING FOR YOURSELF Take care of your breasts.  Bathe or shower daily.   Avoid using soap on your nipples.   Wear a supportive bra. Avoid wearing underwire style bras.  Air dry your nipples for a 3 4minutes after each feeding.   Use only cotton bra pads to absorb breast milk leakage. Leaking of breast milk between feedings is normal.   Use only pure lanolin on your nipples after nursing. You do not need to wash it off before feeding your baby again. Another option is to express a few drops of breast milk and gently massage that milk into your nipples.  Continue breast self-awareness checks. Take care of yourself.  Eat healthy foods. Alternate 3 meals with 3 snacks.  Avoid foods that you notice affect your baby in a bad way.  Drink milk, fruit juice, and water to satisfy your thirst (about 8 glasses a day).   Rest often, relax, and take your prenatal vitamins to prevent fatigue, stress, and anemia.  Avoid chewing and smoking tobacco.  Avoid alcohol and drug use.  Take over-the-counter and prescribed medicine only as directed by your caregiver or pharmacist. You should always check with your caregiver or pharmacist before taking any new medicine, vitamin, or herbal supplement.  Know that pregnancy is possible while  breastfeeding. If desired, talk to your caregiver about family planning and safe birth control methods that may be used while breastfeeding. SEEK MEDICAL CARE IF:   You feel like you want to stop breastfeeding or have become frustrated with breastfeeding.  You have painful breasts or nipples.  Your nipples are cracked or bleeding.  Your breasts are red, tender,   or warm.  You have a swollen area on either breast.  You have a fever or chills.  You have nausea or vomiting.  You have drainage from your nipples.  Your breasts do not become full before feedings by the 5th day after delivery.  You feel sad and depressed.  Your baby is too sleepy to eat well.  Your baby is having trouble sleeping.   Your baby is wetting less than 3 diapers in a 24 hour period.  Your baby has less than 3 stools in a 24 hour period.  Your baby's skin or the white part of his or her eyes becomes more yellow.   Your baby is not gaining weight by 5 days of age. MAKE SURE YOU:   Understand these instructions.  Will watch your condition.  Will get help right away if you are not doing well or get worse. Document Released: 01/16/2005 Document Revised: 10/11/2011 Document Reviewed: 08/23/2011 [employer] Patient Information 2014 [employer].  

## 2012-09-04 LAB — GLUCOSE TOLERANCE, 2 HOURS W/ 1HR: Glucose, 2 hour: 115 mg/dL (ref 70–139)

## 2012-09-04 LAB — ANTIBODY SCREEN: Antibody Screen: NEGATIVE

## 2012-09-04 LAB — HSV 2 ANTIBODY, IGG: HSV 2 Glycoprotein G Ab, IgG: <0.1 IV

## 2012-09-24 ENCOUNTER — Ambulatory Visit (INDEPENDENT_AMBULATORY_CARE_PROVIDER_SITE_OTHER): Payer: BC Managed Care – PPO | Admitting: Women's Health

## 2012-09-24 ENCOUNTER — Encounter: Payer: Self-pay | Admitting: Women's Health

## 2012-09-24 VITALS — BP 120/62 | Wt 217.4 lb

## 2012-09-24 DIAGNOSIS — O09299 Supervision of pregnancy with other poor reproductive or obstetric history, unspecified trimester: Secondary | ICD-10-CM

## 2012-09-24 DIAGNOSIS — Z331 Pregnant state, incidental: Secondary | ICD-10-CM

## 2012-09-24 DIAGNOSIS — O360131 Maternal care for anti-D [Rh] antibodies, third trimester, fetus 1: Secondary | ICD-10-CM

## 2012-09-24 DIAGNOSIS — Z3483 Encounter for supervision of other normal pregnancy, third trimester: Secondary | ICD-10-CM

## 2012-09-24 DIAGNOSIS — Z1389 Encounter for screening for other disorder: Secondary | ICD-10-CM

## 2012-09-24 DIAGNOSIS — O360990 Maternal care for other rhesus isoimmunization, unspecified trimester, not applicable or unspecified: Secondary | ICD-10-CM

## 2012-09-24 DIAGNOSIS — O1213 Gestational proteinuria, third trimester: Secondary | ICD-10-CM

## 2012-09-24 LAB — POCT URINALYSIS DIPSTICK
Glucose, UA: NEGATIVE
Nitrite, UA: NEGATIVE

## 2012-09-24 MED ORDER — RHO D IMMUNE GLOBULIN 300 MCG IM INJ: 300.0000 ug | INJECTION | Freq: Once | INTRAMUSCULAR | Status: DC

## 2012-09-24 MED ORDER — RHO D IMMUNE GLOBULIN 1500 UNIT/2ML IJ SOLN: 300.0000 ug | Freq: Once | INTRAMUSCULAR | Status: DC

## 2012-09-24 NOTE — Patient Instructions (Signed)

## 2012-09-24 NOTE — Progress Notes (Signed)
Reports good fm. Denies uc's, lof, vb, urinary frequency, urgency, hesitancy, or dysuria.  LBP, pelvic pain.  Reviewed LBP relief meausres, ptl s/s, fkc.  All questions answered. F/U in 2wks for visit.

## 2012-09-25 LAB — URINE CULTURE: Organism ID, Bacteria: NO GROWTH

## 2012-09-29 ENCOUNTER — Inpatient Hospital Stay (HOSPITAL_COMMUNITY)
Admission: AD | Admit: 2012-09-29 | Discharge: 2012-09-29 | Disposition: A | Discharge status: Home / Self Care | Payer: BC Managed Care – PPO | Source: Ambulatory Visit | Attending: Obstetrics & Gynecology | Admitting: Obstetrics & Gynecology

## 2012-09-29 ENCOUNTER — Encounter (HOSPITAL_COMMUNITY): Payer: Self-pay | Admitting: Family

## 2012-09-29 DIAGNOSIS — O368130 Decreased fetal movements, third trimester, not applicable or unspecified: Secondary | ICD-10-CM

## 2012-09-29 DIAGNOSIS — R109 Unspecified abdominal pain: Secondary | ICD-10-CM | POA: Insufficient documentation

## 2012-09-29 DIAGNOSIS — Y92009 Unspecified place in unspecified non-institutional (private) residence as the place of occurrence of the external cause: Secondary | ICD-10-CM | POA: Insufficient documentation

## 2012-09-29 DIAGNOSIS — W19XXXA Unspecified fall, initial encounter: Secondary | ICD-10-CM | POA: Insufficient documentation

## 2012-09-29 DIAGNOSIS — O368190 Decreased fetal movements, unspecified trimester, not applicable or unspecified: Secondary | ICD-10-CM

## 2012-09-29 NOTE — MAU Provider Note (Signed)
History     CSN: [id number]  Arrival date and time: 09/29/12 2015   None     Chief Complaint  Patient presents with  . Decreased Fetal Movement   HPI Julie Cummings is a 22 y.o. G3P1011 at [redacted]w[redacted]d prsents for evaluation after falling from hammock yesterday. Pt fell on side. Pt started doing kick counts today and did not have 10 kick in 2 hrs and wanted to be evaluated. After arriving pt now having increased fetal movement, no LOF, no VB and no CTX.  Pt otherwise in [place] of health  OB History   Grav Para Term Preterm Abortions TAB SAB Ect Mult Living   3 1 1  0 1 0 1 0 0 1      Past Medical History  Diagnosis Date  . No pertinent past medical history   . Gall stones   . History of UTI     Past Surgical History  Procedure Laterality Date  . Tonsillectomy    . Cholecystectomy  12/2010  . Ercp      Family History  Problem Relation Age of Onset  . Hypertension Sister   . CAD Maternal Grandfather   . Diabetes Maternal Grandfather   . Stroke Maternal Grandfather   . Heart attack Maternal Grandfather   . Diabetes Paternal Grandfather   . Hypertension Other     grandparents    History  Substance Use Topics  . Smoking status: Former Smoker -- .5 years    Types: Cigarettes  . Smokeless tobacco: Never Used  . Alcohol Use: No    Allergies:  Allergies  Allergen Reactions  . Morphine And Related Other (See Comments)    Racing heart; when patient had gallbladder taken out in December 2012, MD told patient that her heart was racing and not to take Morphine again.    Facility-administered medications prior to admission  Medication Dose Route Frequency Provider Last Rate Last Dose  . rho(D) immune globulin (HYPERHO S/D) injection 300 mcg  300 mcg Intramuscular Once Marge Duncans, CNM       Prescriptions prior to admission  Medication Sig Dispense Refill  . omeprazole (PRILOSEC) 40 MG capsule Take 1 capsule (40 mg total) by mouth daily.  30 capsule  3   . Prenatal Vit-Fe Fumarate-FA (PRENATAL MULTIVITAMIN) TABS Take 1 tablet by mouth daily at 12 noon.        Review of Systems  Constitutional: Negative for fever and chills.  HENT: Negative for neck pain.   Eyes: Negative for blurred vision.  Respiratory: Negative for cough and hemoptysis.   Cardiovascular: Negative for chest pain.  Gastrointestinal: Negative for heartburn, nausea, vomiting, abdominal pain, diarrhea and constipation.  Musculoskeletal: Negative for myalgias, back pain and joint pain.  Neurological: Negative for headaches.   Physical Exam   Blood pressure 116/71, pulse 96, resp. rate 16, last menstrual period 02/10/2012, SpO2 99.00%, unknown if currently breastfeeding.  Physical Exam  Nursing note and vitals reviewed. Constitutional: She appears well-developed and well-nourished. No distress.  HENT:  Head: Normocephalic and atraumatic.  Eyes: Conjunctivae and EOM are normal.  Neck: Normal range of motion. Neck supple.  GI: Soft. Bowel sounds are normal. She exhibits no distension and no mass. There is no tenderness. There is no rebound and no guarding.  Skin: She is not diaphoretic.   FHT: 140s mod variability, multiple accels >15x15 no decels Toco: no ctx  Limited 3rd trim [name]: SIUP, FHT 143, Ant placenta, Vtx, AFI 16.3  MAU  Course  Procedures  MDM Reassuring AFI and NST without any ctx or bleeding. >24hr after incident and reassuring infant. Will discharge home. Assessment and Plan  Julie Cummings is a 22 y.o. G3P1011 at [redacted]w[redacted]d presents for eval of dec fetal movement after trauma >24 hrs ago. Pt with improved fetal movement since arriving, reassuring NST/AFI. Ok to discharge with PTL and Kick count instructions. Pt understands.  Tawana Scale 09/29/2012, 9:37 PM

## 2012-09-29 NOTE — MAU Note (Signed)
Patient presents to MAU with c/o decreased fetal movement since falling out of a hammock onto her right side yesterday at 1600. Denies VB. Reports mild cramping yesterday immediately following incident, none since.  Patient denies pain at this time; reports right side near flank is tender to the touch.

## 2012-10-08 ENCOUNTER — Ambulatory Visit (INDEPENDENT_AMBULATORY_CARE_PROVIDER_SITE_OTHER): Payer: BC Managed Care – PPO | Admitting: Obstetrics & Gynecology

## 2012-10-08 VITALS — BP 100/58 | Wt 223.0 lb

## 2012-10-08 DIAGNOSIS — Z331 Pregnant state, incidental: Secondary | ICD-10-CM

## 2012-10-08 DIAGNOSIS — O09299 Supervision of pregnancy with other poor reproductive or obstetric history, unspecified trimester: Secondary | ICD-10-CM

## 2012-10-08 DIAGNOSIS — O360990 Maternal care for other rhesus isoimmunization, unspecified trimester, not applicable or unspecified: Secondary | ICD-10-CM

## 2012-10-08 DIAGNOSIS — O99019 Anemia complicating pregnancy, unspecified trimester: Secondary | ICD-10-CM

## 2012-10-08 DIAGNOSIS — Z1389 Encounter for screening for other disorder: Secondary | ICD-10-CM

## 2012-10-08 LAB — POCT URINALYSIS DIPSTICK
Blood, UA: NEGATIVE
Glucose, UA: NEGATIVE
Ketones, UA: NEGATIVE
Nitrite, UA: NEGATIVE

## 2012-10-08 NOTE — Progress Notes (Signed)
BP weight and urine results all reviewed and noted. Patient reports good fetal movement, denies any bleeding and no rupture of membranes symptoms or regular contractions. Patient is without complaints. All questions were answered.  

## 2012-10-08 NOTE — Progress Notes (Signed)
C/o "sharp shooting pain belly button and vagina." bilateral swelling in hands and feet.

## 2012-10-22 ENCOUNTER — Encounter: Payer: Self-pay | Admitting: Women's Health

## 2012-10-22 ENCOUNTER — Ambulatory Visit (INDEPENDENT_AMBULATORY_CARE_PROVIDER_SITE_OTHER): Payer: BC Managed Care – PPO | Admitting: Women's Health

## 2012-10-22 VITALS — BP 120/80 | Wt 227.0 lb

## 2012-10-22 DIAGNOSIS — O09299 Supervision of pregnancy with other poor reproductive or obstetric history, unspecified trimester: Secondary | ICD-10-CM

## 2012-10-22 DIAGNOSIS — Z3483 Encounter for supervision of other normal pregnancy, third trimester: Secondary | ICD-10-CM

## 2012-10-22 DIAGNOSIS — Z1389 Encounter for screening for other disorder: Secondary | ICD-10-CM

## 2012-10-22 DIAGNOSIS — Z331 Pregnant state, incidental: Secondary | ICD-10-CM

## 2012-10-22 DIAGNOSIS — O360990 Maternal care for other rhesus isoimmunization, unspecified trimester, not applicable or unspecified: Secondary | ICD-10-CM

## 2012-10-22 DIAGNOSIS — O99019 Anemia complicating pregnancy, unspecified trimester: Secondary | ICD-10-CM

## 2012-10-22 LAB — POCT URINALYSIS DIPSTICK
Blood, UA: NEGATIVE
Glucose, UA: NEGATIVE
Nitrite, UA: NEGATIVE

## 2012-10-22 NOTE — Progress Notes (Signed)
Pt here for routine visit. Pt states she is having lots of pressure in her lower pelvic area. Pt states that she has had a couple episodes of headaches that are not like her normal headaches. Pt denies any other problems or concerns at this time.

## 2012-10-22 NOTE — Progress Notes (Signed)
Reports good fm. Denies uc's, lof, vb, urinary frequency, urgency, hesitancy, or dysuria.  Heat sensation, then headache at base of skull that comes around to top, then will eventually ease off. Sounds like tension ha's, recommended partner giving her shoulder/neck massages, apap if needed.  Requested sve: 1/th/-3, vtx. Reviewed ptl s/s, fkc.  All questions answered. F/U in 1wk for visit and gbs.

## 2012-10-22 NOTE — Patient Instructions (Signed)

## 2012-10-26 ENCOUNTER — Encounter (HOSPITAL_COMMUNITY): Payer: Self-pay | Admitting: *Deleted

## 2012-10-26 ENCOUNTER — Inpatient Hospital Stay (HOSPITAL_COMMUNITY)
Admission: AD | Admit: 2012-10-26 | Discharge: 2012-10-26 | Disposition: A | Discharge status: Home / Self Care | Payer: BC Managed Care – PPO | Source: Ambulatory Visit | Attending: Obstetrics & Gynecology | Admitting: Obstetrics & Gynecology

## 2012-10-26 DIAGNOSIS — O99891 Other specified diseases and conditions complicating pregnancy: Secondary | ICD-10-CM

## 2012-10-26 DIAGNOSIS — N949 Unspecified condition associated with female genital organs and menstrual cycle: Secondary | ICD-10-CM

## 2012-10-26 LAB — POCT FERN TEST: POCT Fern Test: NEGATIVE

## 2012-10-26 LAB — URINALYSIS, ROUTINE W REFLEX MICROSCOPIC
Glucose, UA: NEGATIVE mg/dL
Leukocytes, UA: NEGATIVE
Protein, ur: NEGATIVE mg/dL
Specific Gravity, Urine: 1.02 (ref 1.005–1.030)
Urobilinogen, UA: 0.2 mg/dL (ref 0.0–1.0)

## 2012-10-26 NOTE — MAU Provider Note (Signed)
History     CSN: [id number]  Arrival date and time: 10/26/12 1348   First Provider Initiated Contact with Patient 10/26/12 1435      Chief Complaint  Patient presents with  . Vaginal Discharge   HPI Julie Cummings is a 22 y.o. G23P1011 female @ [redacted]w[redacted]d who presents w/ report of lof during sexual intercourse last night.  None since. Partner did not ejaculate. Denies uc's or vb. Reports good fm. Next appt Tues at [facility].   OB History   Grav Para Term Preterm Abortions TAB SAB Ect Mult Living   3 1 1  0 1 0 1 0 0 1      Past Medical History  Diagnosis Date  . No pertinent past medical history   . Gall stones   . History of UTI     Past Surgical History  Procedure Laterality Date  . Tonsillectomy    . Cholecystectomy  12/2010  . Ercp      Family History  Problem Relation Age of Onset  . Hypertension Sister   . CAD Maternal Grandfather   . Diabetes Maternal Grandfather   . Stroke Maternal Grandfather   . Heart attack Maternal Grandfather   . Diabetes Paternal Grandfather   . Hypertension Other     grandparents    History  Substance Use Topics  . Smoking status: Former Smoker -- .5 years    Types: Cigarettes  . Smokeless tobacco: Never Used  . Alcohol Use: No    Allergies:  Allergies  Allergen Reactions  . Morphine And Related Other (See Comments)    Racing heart; when patient had gallbladder taken out in December 2012, MD told patient that her heart was racing and not to take Morphine again.    Facility-administered medications prior to admission  Medication Dose Route Frequency Provider Last Rate Last Dose  . rho(D) immune globulin (HYPERHO S/D) injection 300 mcg  300 mcg Intramuscular Once Marge Duncans, CNM       Prescriptions prior to admission  Medication Sig Dispense Refill  . metoCLOPramide (REGLAN) 10 MG tablet Take 10 mg by mouth 4 (four) times daily.      Marland Kitchen omeprazole (PRILOSEC) 40 MG capsule Take 1 capsule (40 mg total) by mouth daily.   30 capsule  3  . Prenatal Vit-Fe Fumarate-FA (PRENATAL MULTIVITAMIN) TABS Take 1 tablet by mouth daily at 12 noon.        Review of Systems  Constitutional: Negative.   HENT: Negative.   Eyes: Negative.   Respiratory: Negative.   Cardiovascular: Negative.   Gastrointestinal: Negative.   Genitourinary: Negative.   Musculoskeletal: Negative.   Skin: Negative.   Neurological: Negative.   Endo/Heme/Allergies: Negative.   Psychiatric/Behavioral: Negative.    Physical Exam   Blood pressure 140/78, pulse 85, temperature 97.7 F (36.5 C), resp. rate 18, height 5\' 5"  (1.651 m), weight 102.513 kg (226 lb), last menstrual period 02/10/2012, unknown if currently breastfeeding.  Physical Exam  Constitutional: She appears well-developed and well-nourished.  HENT:  Head: Normocephalic.  Neck: Normal range of motion.  Cardiovascular: Normal rate.   Respiratory: Effort normal.  GI: Soft.  gravid  Genitourinary:  SSE: no pooling of fluid, creamy white nonodorous d/c, fern neg  SVE: 1/th/-3, vtx, bag of water felt    MAU Course  Procedures  NST SSE w/ neg fern and neg pooling SVE  Assessment and Plan  A:   [redacted]w[redacted]d SIUP  G3P1011   Cat I FHR  Intact membranes  P:  D/C home  Keep appt as scheduled Tues at [facility]  Reviewed ptl s/s, reasons to return  Cheral Marker, CNM, [facility] 10/26/2012 2:58 PM

## 2012-10-26 NOTE — MAU Note (Signed)
Pt presents with complaints of having some leakage of fluid since last night. She states that her and her husband were having intercourse and she noticed some fluid leaking out and wants to make sure it is not amniotic fluid.

## 2012-10-26 NOTE — MAU Provider Note (Signed)
Attestation of Attending Supervision of Advanced Practitioner (CNM/NP): Evaluation and management procedures were performed by the Advanced Practitioner under my supervision and collaboration. I have reviewed the Advanced Practitioner's note and chart, and I agree with the management and plan.  Tommy Minichiello H. 6:17 PM

## 2012-10-29 ENCOUNTER — Ambulatory Visit (INDEPENDENT_AMBULATORY_CARE_PROVIDER_SITE_OTHER): Payer: BC Managed Care – PPO | Admitting: Women's Health

## 2012-10-29 ENCOUNTER — Encounter: Payer: Self-pay | Admitting: Women's Health

## 2012-10-29 VITALS — BP 112/60 | Wt 227.0 lb

## 2012-10-29 DIAGNOSIS — O360990 Maternal care for other rhesus isoimmunization, unspecified trimester, not applicable or unspecified: Secondary | ICD-10-CM

## 2012-10-29 DIAGNOSIS — Z3483 Encounter for supervision of other normal pregnancy, third trimester: Secondary | ICD-10-CM

## 2012-10-29 DIAGNOSIS — O09299 Supervision of pregnancy with other poor reproductive or obstetric history, unspecified trimester: Secondary | ICD-10-CM

## 2012-10-29 DIAGNOSIS — Z331 Pregnant state, incidental: Secondary | ICD-10-CM

## 2012-10-29 DIAGNOSIS — Z1389 Encounter for screening for other disorder: Secondary | ICD-10-CM

## 2012-10-29 DIAGNOSIS — O99019 Anemia complicating pregnancy, unspecified trimester: Secondary | ICD-10-CM

## 2012-10-29 LAB — OB RESULTS CONSOLE GC/CHLAMYDIA
Chlamydia: NEGATIVE
Gonorrhea: NEGATIVE

## 2012-10-29 LAB — POCT URINALYSIS DIPSTICK
Blood, UA: NEGATIVE
Protein, UA: NEGATIVE

## 2012-10-29 NOTE — Patient Instructions (Addendum)
Breastfeeding A change in hormones during your pregnancy causes growth of your breast tissue and an increase in number and size of milk ducts. The hormone prolactin allows proteins, sugars, and fats from your blood supply to make breast milk in your milk-producing glands. The hormone progesterone prevents breast milk from being released before the birth of your baby. After the birth of your baby, your progesterone level decreases allowing breast milk to be released. Thoughts of your baby, as well as his or her sucking or crying, can stimulate the release of milk from the milk-producing glands. Deciding to breastfeed (nurse) is one of the best choices you can make for you and your baby. The information that follows gives a brief review of the benefits, as well as other important skills to know about breastfeeding. BENEFITS OF BREASTFEEDING For your baby  The first milk (colostrum) helps your baby's digestive system function better.   There are antibodies in your milk that help your baby fight off infections.   Your baby has a lower incidence of asthma, allergies, and sudden infant death syndrome (SIDS).   The nutrients in breast milk are better for your baby than infant formulas.  Breast milk improves your baby's brain development.   Your baby will have less gas, colic, and constipation.  Your baby is less likely to develop other conditions, such as childhood obesity, asthma, or diabetes mellitus. For you  Breastfeeding helps develop a very special bond between you and your baby.   Breastfeeding is convenient, always available at the correct temperature, and costs nothing.   Breastfeeding helps to burn calories and helps you lose the weight gained during pregnancy.   Breastfeeding makes your uterus contract back down to normal size faster and slows bleeding following delivery.   Breastfeeding mothers have a lower risk of developing osteoporosis or breast or ovarian cancer later  in life.  BREASTFEEDING FREQUENCY  A healthy, full-term baby may breastfeed as often as every hour or space his or her feedings to every 3 hours. Breastfeeding frequency will vary from baby to baby.   Newborns should be fed no less than every 2 3 hours during the day and every 4 5 hours during the night. You should breastfeed a minimum of 8 feedings in a 24 hour period.  Awaken your baby to breastfeed if it has been 3 4 hours since the last feeding.  Breastfeed when you feel the need to reduce the fullness of your breasts or when your newborn shows signs of hunger. Signs that your baby may be hungry include:  Increased alertness or activity.  Stretching.  Movement of the head from side to side.  Movement of the head and opening of the mouth when the corner of the mouth or cheek is stroked (rooting).  Increased sucking sounds, smacking lips, cooing, sighing, or squeaking.  Hand-to-mouth movements.  Increased sucking of fingers or hands.  Fussing.  Intermittent crying.  Signs of extreme hunger will require calming and consoling before you try to feed your baby. Signs of extreme hunger may include:  Restlessness.  A loud, strong cry.  Screaming.  Frequent feeding will help you make more milk and will help prevent problems, such as sore nipples and engorgement of the breasts.  BREASTFEEDING   Whether lying down or sitting, be sure that the baby's abdomen is facing your abdomen.   Support your breast with 4 fingers under your breast and your thumb above your nipple. Make sure your fingers are well away from   your nipple and your baby's mouth.   Stroke your baby's lips gently with your finger or nipple.   When your baby's mouth is open wide enough, place all of your nipple and as much of the colored area around your nipple (areola) as possible into your baby's mouth.  More areola should be visible above his or her upper lip than below his or her lower lip.  Your  baby's tongue should be between his or her lower gum and your breast.  Ensure that your baby's mouth is correctly positioned around the nipple (latched). Your baby's lips should create a seal on your breast.  Signs that your baby has effectively latched onto your nipple include:  Tugging or sucking without pain.  Swallowing heard between sucks.  Absent click or smacking sound.  Muscle movement above and in front of his or her ears with sucking.  Your baby must suck about 2 3 minutes in order to get your milk. Allow your baby to feed on each breast as long as he or she wants. Nurse your baby until he or she unlatches or falls asleep at the first breast, then offer the second breast.  Signs that your baby is full and satisfied include:  A gradual decrease in the number of sucks or complete cessation of sucking.  Falling asleep.  Extension or relaxation of his or her body.  Retention of a small amount of milk in his or her mouth.  Letting go of your breast by himself or herself.  Signs of effective breastfeeding in you include:  Breasts that have increased firmness, weight, and size prior to feeding.  Breasts that are softer after nursing.  Increased milk volume, as well as a change in milk consistency and color by the 5th day of breastfeeding.  Breast fullness relieved by breastfeeding.  Nipples are not sore, cracked, or bleeding.  If needed, break the suction by putting your finger into the corner of your baby's mouth and sliding your finger between his or her gums. Then, remove your breast from his or her mouth.  It is common for babies to spit up a small amount after a feeding.  Babies often swallow air during feeding. This can make babies fussy. Burping your baby between breasts can help with this.  Vitamin D supplements are recommended for babies who get only breast milk.  Avoid using a pacifier during your baby's first 4 6 weeks.  Avoid supplemental feedings of  water, formula, or juice in place of breastfeeding. Breast milk is all the food your baby needs. It is not necessary for your baby to have water or formula. Your breasts will make more milk if supplemental feedings are avoided during the early weeks. HOW TO TELL WHETHER YOUR BABY IS GETTING ENOUGH BREAST MILK Wondering whether or not your baby is getting enough milk is a common concern among mothers. You can be assured that your baby is getting enough milk if:   Your baby is actively sucking and you hear swallowing.   Your baby seems relaxed and satisfied after a feeding.   Your baby nurses at least 8 12 times in a 24 hour time period.  During the first 3 5 days of age:  Your baby is wetting at least 3 5 diapers in a 24 hour period. The urine should be clear and pale yellow.  Your baby is having at least 3 4 stools in a 24 hour period. The stool should be soft and yellow.  At   5 7 days of age, your baby is having at least 3 6 stools in a 24 hour period. The stool should be seedy and yellow by 5 days of age.  Your baby has a weight loss less than 7 10% during the first 3 days of age.  Your baby does not lose weight after 3 7 days of age.  Your baby gains 4 7 ounces each week after he or she is 4 days of age.  Your baby gains weight by 5 days of age and is back to birth weight within 2 weeks. ENGORGEMENT In the first week after your baby is born, you may experience extremely full breasts (engorgement). When engorged, your breasts may feel heavy, warm, or tender to the touch. Engorgement peaks within 24 48 hours after delivery of your baby.  Engorgement may be reduced by:  Continuing to breastfeed.  Increasing the frequency of breastfeeding.  Taking warm showers or applying warm, moist heat to your breasts just before each feeding. This increases circulation and helps the milk flow.   Gently massaging your breast before and during the feedings. With your fingertips, massage from  your chest wall towards your nipple in a circular motion.   Ensuring that your baby empties at least one breast at every feeding. It also helps to start the next feeding on the opposite breast.   Expressing breast milk by hand or by using a breast pump to empty the breasts if your baby is sleepy, or not nursing well. You may also want to express milk if you are returning to work oryou feel you are getting engorged.  Ensuring your baby is latched on and positioned properly while breastfeeding. If you follow these suggestions, your engorgement should improve in 24 48 hours. If you are still experiencing difficulty, call your lactation consultant or caregiver.  CARING FOR YOURSELF Take care of your breasts.  Bathe or shower daily.   Avoid using soap on your nipples.   Wear a supportive bra. Avoid wearing underwire style bras.  Air dry your nipples for a 3 4minutes after each feeding.   Use only cotton bra pads to absorb breast milk leakage. Leaking of breast milk between feedings is normal.   Use only pure lanolin on your nipples after nursing. You do not need to wash it off before feeding your baby again. Another option is to express a few drops of breast milk and gently massage that milk into your nipples.  Continue breast self-awareness checks. Take care of yourself.  Eat healthy foods. Alternate 3 meals with 3 snacks.  Avoid foods that you notice affect your baby in a bad way.  Drink milk, fruit juice, and water to satisfy your thirst (about 8 glasses a day).   Rest often, relax, and take your prenatal vitamins to prevent fatigue, stress, and anemia.  Avoid chewing and smoking tobacco.  Avoid alcohol and drug use.  Take over-the-counter and prescribed medicine only as directed by your caregiver or pharmacist. You should always check with your caregiver or pharmacist before taking any new medicine, vitamin, or herbal supplement.  Know that pregnancy is possible while  breastfeeding. If desired, talk to your caregiver about family planning and safe birth control methods that may be used while breastfeeding. SEEK MEDICAL CARE IF:   You feel like you want to stop breastfeeding or have become frustrated with breastfeeding.  You have painful breasts or nipples.  Your nipples are cracked or bleeding.  Your breasts are red, tender,   or warm.  You have a swollen area on either breast.  You have a fever or chills.  You have nausea or vomiting.  You have drainage from your nipples.  Your breasts do not become full before feedings by the 5th day after delivery.  You feel sad and depressed.  Your baby is too sleepy to eat well.  Your baby is having trouble sleeping.   Your baby is wetting less than 3 diapers in a 24 hour period.  Your baby has less than 3 stools in a 24 hour period.  Your baby's skin or the white part of his or her eyes becomes more yellow.   Your baby is not gaining weight by 5 days of age. MAKE SURE YOU:   Understand these instructions.  Will watch your condition.  Will get help right away if you are not doing well or get worse. Document Released: 01/16/2005 Document Revised: 10/11/2011 Document Reviewed: 08/23/2011 [employer] Patient Information 2014 [employer].  

## 2012-10-29 NOTE — Addendum Note (Signed)
Addended by: Richardson Chiquito on: 10/29/2012 02:52 PM   Modules accepted: Orders

## 2012-10-29 NOTE — Progress Notes (Signed)
Pt here today for routine visit and GBS. Still having the same pressure and pain as before. Pt denies any other problems or concerns at this time.

## 2012-10-29 NOTE — Progress Notes (Signed)
Reports good fm. Denies uc's, lof, vb, urinary frequency, urgency, hesitancy, or dysuria.  Pelvic pressure. Reviewed ptl s/s, fkc.  All questions answered. GBS today. F/U in 1wk for visit.

## 2012-11-02 ENCOUNTER — Encounter: Payer: Self-pay | Admitting: Women's Health

## 2012-11-05 ENCOUNTER — Ambulatory Visit (INDEPENDENT_AMBULATORY_CARE_PROVIDER_SITE_OTHER): Payer: BC Managed Care – PPO | Admitting: Women's Health

## 2012-11-05 ENCOUNTER — Encounter: Payer: Self-pay | Admitting: Women's Health

## 2012-11-05 VITALS — BP 104/72 | Wt 227.0 lb

## 2012-11-05 DIAGNOSIS — Z23 Encounter for immunization: Secondary | ICD-10-CM

## 2012-11-05 DIAGNOSIS — Z331 Pregnant state, incidental: Secondary | ICD-10-CM

## 2012-11-05 DIAGNOSIS — Z1389 Encounter for screening for other disorder: Secondary | ICD-10-CM

## 2012-11-05 DIAGNOSIS — O09299 Supervision of pregnancy with other poor reproductive or obstetric history, unspecified trimester: Secondary | ICD-10-CM

## 2012-11-05 DIAGNOSIS — Z3483 Encounter for supervision of other normal pregnancy, third trimester: Secondary | ICD-10-CM

## 2012-11-05 DIAGNOSIS — O360990 Maternal care for other rhesus isoimmunization, unspecified trimester, not applicable or unspecified: Secondary | ICD-10-CM

## 2012-11-05 DIAGNOSIS — O99019 Anemia complicating pregnancy, unspecified trimester: Secondary | ICD-10-CM

## 2012-11-05 LAB — POCT URINALYSIS DIPSTICK
Glucose, UA: NEGATIVE
Ketones, UA: NEGATIVE
Leukocytes, UA: NEGATIVE
Nitrite, UA: NEGATIVE

## 2012-11-05 MED ORDER — INFLUENZA VAC SPLIT QUAD 0.5 ML IM SUSP
0.5000 mL | Freq: Once | INTRAMUSCULAR | Status: AC
  Administered 2012-11-05: 0.5 mL via INTRAMUSCULAR

## 2012-11-05 NOTE — Patient Instructions (Signed)
Etonogestrel implant-nexplanon What is this medicine? ETONOGESTREL is a contraceptive (birth control) device. It is used to prevent pregnancy. It can be used for up to 3 years. This medicine may be used for other purposes; ask your health care provider or pharmacist if you have questions. What should I tell my health care provider before I take this medicine? They need to know if you have any of these conditions: -abnormal vaginal bleeding -blood vessel disease or blood clots -cancer of the breast, cervix, or liver -depression -diabetes -gallbladder disease -headaches -heart disease or recent heart attack -high blood pressure -high cholesterol -kidney disease -liver disease -renal disease -seizures -tobacco smoker -an unusual or allergic reaction to etonogestrel, other hormones, anesthetics or antiseptics, medicines, foods, dyes, or preservatives -pregnant or trying to get pregnant -breast-feeding How should I use this medicine? This device is inserted just under the skin on the inner side of your upper arm by a health care professional. Talk to your pediatrician regarding the use of this medicine in children. Special care may be needed. Overdosage: If you think you've taken too much of this medicine contact a poison control center or emergency room at once. Overdosage: If you think you have taken too much of this medicine contact a poison control center or emergency room at once. NOTE: This medicine is only for you. Do not share this medicine with others. What if I miss a dose? This does not apply. What may interact with this medicine? Do not take this medicine with any of the following medications: -amprenavir -bosentan -fosamprenavir This medicine may also interact with the following medications: -barbiturate medicines for inducing sleep or treating seizures -certain medicines for fungal infections like ketoconazole and itraconazole -griseofulvin -medicines to treat  seizures like carbamazepine, felbamate, oxcarbazepine, phenytoin, topiramate -modafinil -phenylbutazone -rifampin -some medicines to treat HIV infection like atazanavir, indinavir, lopinavir, nelfinavir, tipranavir, ritonavir -St. John's wort This list may not describe all possible interactions. Give your health care provider a list of all the medicines, herbs, non-prescription drugs, or dietary supplements you use. Also tell them if you smoke, drink alcohol, or use illegal drugs. Some items may interact with your medicine. What should I watch for while using this medicine? This product does not protect you against HIV infection (AIDS) or other sexually transmitted diseases. You should be able to feel the implant by pressing your fingertips over the skin where it was inserted. Tell your doctor if you cannot feel the implant. What side effects may I notice from receiving this medicine? Side effects that you should report to your doctor or health care professional as soon as possible: -allergic reactions like skin rash, itching or hives, swelling of the face, lips, or tongue -breast lumps -changes in vision -confusion, trouble speaking or understanding -dark urine -depressed mood -general ill feeling or flu-like symptoms -light-colored stools -loss of appetite, nausea -right upper belly pain -severe headaches -severe pain, swelling, or tenderness in the abdomen -shortness of breath, chest pain, swelling in a leg -signs of pregnancy -sudden numbness or weakness of the face, arm or leg -trouble walking, dizziness, loss of balance or coordination -unusual vaginal bleeding, discharge -unusually weak or tired -yellowing of the eyes or skin Side effects that usually do not require medical attention (Report these to your doctor or health care professional if they continue or are bothersome.): -acne -breast pain -changes in weight -cough -fever or chills -headache -irregular menstrual  bleeding -itching, burning, and vaginal discharge -pain or difficulty passing urine -sore throat This  list may not describe all possible side effects. Call your doctor for medical advice about side effects. You may report side effects to FDA at 1-800-FDA-1088. Where should I keep my medicine? This drug is given in a hospital or clinic and will not be stored at home. NOTE: This sheet is a summary. It may not cover all possible information. If you have questions about this medicine, talk to your doctor, pharmacist, or health care provider.  2013, [employer]. (10/09/2008 3:54:17 PM)  

## 2012-11-05 NOTE — Progress Notes (Signed)
Lost mucus plug last Thursday.

## 2012-11-05 NOTE — Progress Notes (Signed)
Reports good fm. Denies uc's, vb, urinary frequency, urgency, hesitancy, or dysuria.  Felt wetness in panties when walking on Sunday, no big gush of fluid or continued wetness feeling. SSE: no pooling of fluid, nothing w/ valsalva, cx visually closed. SVE per pt request 1.5/50/-2, vtx.  Reviewed labor s/s, fkc.  All questions answered. F/U in 1wk for visit.

## 2012-11-09 ENCOUNTER — Encounter (HOSPITAL_COMMUNITY): Payer: Self-pay | Admitting: *Deleted

## 2012-11-09 ENCOUNTER — Inpatient Hospital Stay (HOSPITAL_COMMUNITY)
Admission: AD | Admit: 2012-11-09 | Discharge: 2012-11-09 | Disposition: A | Discharge status: Home / Self Care | Payer: BC Managed Care – PPO | Source: Ambulatory Visit | Attending: Obstetrics & Gynecology | Admitting: Obstetrics & Gynecology

## 2012-11-09 DIAGNOSIS — O479 False labor, unspecified: Secondary | ICD-10-CM

## 2012-11-09 DIAGNOSIS — R109 Unspecified abdominal pain: Secondary | ICD-10-CM

## 2012-11-09 DIAGNOSIS — O99891 Other specified diseases and conditions complicating pregnancy: Secondary | ICD-10-CM | POA: Insufficient documentation

## 2012-11-09 NOTE — MAU Note (Signed)
Pt. Here due to all  tenderness she is experiencing in her abdomen. Also is unsure if she is having contractions. Feels pain on whichever side she lays on and this pain comes and goes. Denies fluid leakage or blood. Feels that she lost her mucous plug 1 week ago Thursday. Did see Dr. Emelda Fear last Tuesday and was 1.5 cm at that time. Baby is moving well.

## 2012-11-09 NOTE — MAU Note (Signed)
Pt states since 2300 last pm has had pain, feels like stretching on sides of her abdomen. Denies bleeding or lof. Thought she was having contractions however is unsure now. Was 1.5cm in office last week. Noted pain moreso on her left side, changed positions in bed onto her right and pain was relieved. Here for further eval.

## 2012-11-09 NOTE — MAU Provider Note (Signed)
History     CSN: [id number]  Arrival date and time: 11/09/12 1134   None     Chief Complaint  Patient presents with  . Labor Eval   HPI Julie Cummings is a 22 y.o. G25P1011 female @ [redacted]w[redacted]d who presents w/ report of generalized abdomen soreness since last night. Unsure if she's having uc's. No recent trauma, fever/chills, n/v/d. Reports good fm, denies vb or lof. Next appt at [facility] on Tues.   OB History   Grav Para Term Preterm Abortions TAB SAB Ect Mult Living   3 1 1  0 1 0 1 0 0 1      Past Medical History  Diagnosis Date  . No pertinent past medical history   . Gall stones   . History of UTI     Past Surgical History  Procedure Laterality Date  . Tonsillectomy    . Cholecystectomy  12/2010  . Ercp      Family History  Problem Relation Age of Onset  . Hypertension Sister   . CAD Maternal Grandfather   . Diabetes Maternal Grandfather   . Stroke Maternal Grandfather   . Heart attack Maternal Grandfather   . Diabetes Paternal Grandfather   . Hypertension Other     grandparents    History  Substance Use Topics  . Smoking status: Former Smoker -- .5 years    Types: Cigarettes  . Smokeless tobacco: Never Used  . Alcohol Use: No    Allergies:  Allergies  Allergen Reactions  . Morphine And Related Other (See Comments)    Racing heart; when patient had gallbladder taken out in December 2012, MD told patient that her heart was racing and not to take Morphine again.    Prescriptions prior to admission  Medication Sig Dispense Refill  . metoCLOPramide (REGLAN) 10 MG tablet Take 10 mg by mouth daily as needed (nausea).       Marland Kitchen omeprazole (PRILOSEC) 40 MG capsule Take 1 capsule (40 mg total) by mouth daily.  30 capsule  3  . Prenatal Vit-Fe Fumarate-FA (PRENATAL MULTIVITAMIN) TABS Take 1 tablet by mouth daily at 12 noon.        Review of Systems  Constitutional: Negative.  Negative for fever and chills.  HENT: Negative.   Eyes: Negative.   Respiratory:  Negative.   Cardiovascular: Negative.   Gastrointestinal: Positive for abdominal pain (generalized abdominal soreness).  Genitourinary: Negative.   Musculoskeletal: Negative.   Skin: Negative.   Neurological: Negative.   Endo/Heme/Allergies: Negative.   Psychiatric/Behavioral: Negative.    Physical Exam   Blood pressure 118/81, pulse 116, temperature 97.7 F (36.5 C), temperature source Oral, resp. rate 18, height 5\' 4"  (1.626 m), weight 103.534 kg (228 lb 4 oz), last menstrual period 02/10/2012, not currently breastfeeding.  Physical Exam  Constitutional: She is oriented to person, place, and time. She appears well-developed and well-nourished.  HENT:  Head: Normocephalic.  Neck: Normal range of motion.  Cardiovascular: Normal rate.   Respiratory: Effort normal.  GI: Soft. Tenderness: slight tenderness over entire abdomen.  Gravid w/ many striae  Genitourinary:  SVE by RN: 1.5/50/-2  Musculoskeletal: Normal range of motion.  Neurological: She is alert and oriented to person, place, and time.  Skin: Skin is warm and dry.  Psychiatric: She has a normal mood and affect. Her behavior is normal. Judgment and thought content normal.   FHR: 135, mod variability, 15x15accels, no decels=Cat I UCs: irregular, q 3-7 w/ ui MAU Course  Procedures  NST SVE x 2 w/o change  Assessment and Plan   A:   [redacted]w[redacted]d SIUP  G3P1011   Generalized abdominal soreness, possibly from uterine/skin stretching, multiple striae  Cat I FHR   P:   D/C home  Keep appt as scheduled on Tues at [facility]  Reviewed labor s/s, fkc, reasons to return  Can try coco butter, stretch mark creams, etc   Marge Duncans 11/09/2012, 1:13 PM

## 2012-11-09 NOTE — MAU Provider Note (Signed)
Attestation of Attending Supervision of Advanced Practitioner (PA/CNM/NP): Evaluation and management procedures were performed by the Advanced Practitioner under my supervision and collaboration.  I have reviewed the Advanced Practitioner's note and chart, and I agree with the management and plan.  Thong Feeny, MD, FACOG Attending Obstetrician & Gynecologist Faculty Practice, [facility]  

## 2012-11-12 ENCOUNTER — Ambulatory Visit (INDEPENDENT_AMBULATORY_CARE_PROVIDER_SITE_OTHER): Payer: BC Managed Care – PPO | Admitting: Obstetrics & Gynecology

## 2012-11-12 ENCOUNTER — Encounter: Payer: Self-pay | Admitting: Obstetrics & Gynecology

## 2012-11-12 VITALS — BP 120/80 | Wt 228.0 lb

## 2012-11-12 DIAGNOSIS — Z1389 Encounter for screening for other disorder: Secondary | ICD-10-CM

## 2012-11-12 DIAGNOSIS — O09299 Supervision of pregnancy with other poor reproductive or obstetric history, unspecified trimester: Secondary | ICD-10-CM

## 2012-11-12 DIAGNOSIS — O360990 Maternal care for other rhesus isoimmunization, unspecified trimester, not applicable or unspecified: Secondary | ICD-10-CM

## 2012-11-12 DIAGNOSIS — Z331 Pregnant state, incidental: Secondary | ICD-10-CM

## 2012-11-12 DIAGNOSIS — Z3483 Encounter for supervision of other normal pregnancy, third trimester: Secondary | ICD-10-CM

## 2012-11-12 DIAGNOSIS — O99019 Anemia complicating pregnancy, unspecified trimester: Secondary | ICD-10-CM

## 2012-11-12 LAB — POCT URINALYSIS DIPSTICK
Blood, UA: NEGATIVE
Ketones, UA: NEGATIVE
Leukocytes, UA: NEGATIVE
Nitrite, UA: NEGATIVE
Protein, UA: NEGATIVE

## 2012-11-12 NOTE — Progress Notes (Signed)
Having lots of pain.

## 2012-11-12 NOTE — Progress Notes (Signed)
Reports lower abdominal pain; severe with ambulation.  Reports intermittent contractions (regular for an hour or so and then stop).  No loss of fluid, vaginal bleeding.  Good fetal movement.  Cervical exam: 2/50/-2.   Patient to follow up on Friday for possible membrane stripping.  All questions answered.

## 2012-11-15 ENCOUNTER — Encounter: Payer: Self-pay | Admitting: Obstetrics & Gynecology

## 2012-11-15 ENCOUNTER — Ambulatory Visit (INDEPENDENT_AMBULATORY_CARE_PROVIDER_SITE_OTHER): Payer: BC Managed Care – PPO | Admitting: Obstetrics & Gynecology

## 2012-11-15 VITALS — BP 110/60 | Wt 230.0 lb

## 2012-11-15 DIAGNOSIS — Z3483 Encounter for supervision of other normal pregnancy, third trimester: Secondary | ICD-10-CM

## 2012-11-15 DIAGNOSIS — O99019 Anemia complicating pregnancy, unspecified trimester: Secondary | ICD-10-CM

## 2012-11-15 DIAGNOSIS — Z331 Pregnant state, incidental: Secondary | ICD-10-CM

## 2012-11-15 DIAGNOSIS — O360990 Maternal care for other rhesus isoimmunization, unspecified trimester, not applicable or unspecified: Secondary | ICD-10-CM

## 2012-11-15 DIAGNOSIS — O09299 Supervision of pregnancy with other poor reproductive or obstetric history, unspecified trimester: Secondary | ICD-10-CM

## 2012-11-15 DIAGNOSIS — Z1389 Encounter for screening for other disorder: Secondary | ICD-10-CM

## 2012-11-15 LAB — POCT URINALYSIS DIPSTICK
Blood, UA: NEGATIVE
Ketones, UA: NEGATIVE
Nitrite, UA: NEGATIVE
Protein, UA: NEGATIVE

## 2012-11-15 NOTE — Progress Notes (Signed)
BP weight and urine results all reviewed and noted. Patient reports good fetal movement, denies any bleeding and no rupture of membranes symptoms or regular contractions. Patient is without complaints. All questions were answered.  

## 2012-11-17 ENCOUNTER — Inpatient Hospital Stay (HOSPITAL_COMMUNITY)
Admission: AD | Admit: 2012-11-17 | Discharge: 2012-11-17 | Disposition: A | Discharge status: Home / Self Care | Payer: BC Managed Care – PPO | Source: Ambulatory Visit | Attending: Obstetrics and Gynecology | Admitting: Obstetrics and Gynecology

## 2012-11-17 ENCOUNTER — Encounter (HOSPITAL_COMMUNITY): Payer: Self-pay | Admitting: *Deleted

## 2012-11-17 DIAGNOSIS — O479 False labor, unspecified: Secondary | ICD-10-CM | POA: Insufficient documentation

## 2012-11-17 NOTE — MAU Note (Signed)
Contractions since Friday. Today since 9 am contractions have been every 6-8 minutes with increasing intensity. Baby has been moving less than normal. Denies vaginal bleeding and gushes of fluid.

## 2012-11-18 ENCOUNTER — Encounter: Payer: Self-pay | Admitting: Women's Health

## 2012-11-18 ENCOUNTER — Encounter (HOSPITAL_COMMUNITY): Payer: Self-pay | Admitting: *Deleted

## 2012-11-18 ENCOUNTER — Ambulatory Visit (INDEPENDENT_AMBULATORY_CARE_PROVIDER_SITE_OTHER): Payer: BC Managed Care – PPO | Admitting: Women's Health

## 2012-11-18 ENCOUNTER — Inpatient Hospital Stay (HOSPITAL_COMMUNITY): Payer: BC Managed Care – PPO | Admitting: Anesthesiology

## 2012-11-18 ENCOUNTER — Encounter (HOSPITAL_COMMUNITY): Payer: BC Managed Care – PPO | Admitting: Anesthesiology

## 2012-11-18 ENCOUNTER — Inpatient Hospital Stay (HOSPITAL_COMMUNITY)
Admission: AD | Admit: 2012-11-18 | Discharge: 2012-11-20 | DRG: 775 | Disposition: A | Discharge status: Home / Self Care | Payer: BC Managed Care – PPO | Source: Ambulatory Visit | Attending: Obstetrics & Gynecology | Admitting: Obstetrics & Gynecology

## 2012-11-18 VITALS — BP 122/80 | Wt 229.0 lb

## 2012-11-18 DIAGNOSIS — O09299 Supervision of pregnancy with other poor reproductive or obstetric history, unspecified trimester: Secondary | ICD-10-CM

## 2012-11-18 DIAGNOSIS — O360990 Maternal care for other rhesus isoimmunization, unspecified trimester, not applicable or unspecified: Secondary | ICD-10-CM

## 2012-11-18 DIAGNOSIS — Z3483 Encounter for supervision of other normal pregnancy, third trimester: Secondary | ICD-10-CM

## 2012-11-18 DIAGNOSIS — Z331 Pregnant state, incidental: Secondary | ICD-10-CM

## 2012-11-18 DIAGNOSIS — O360131 Maternal care for anti-D [Rh] antibodies, third trimester, fetus 1: Secondary | ICD-10-CM

## 2012-11-18 DIAGNOSIS — Z1389 Encounter for screening for other disorder: Secondary | ICD-10-CM

## 2012-11-18 DIAGNOSIS — O99019 Anemia complicating pregnancy, unspecified trimester: Secondary | ICD-10-CM

## 2012-11-18 HISTORY — DX: Gastro-esophageal reflux disease without esophagitis: K21.9

## 2012-11-18 LAB — RPR: RPR Ser Ql: NONREACTIVE

## 2012-11-18 LAB — POCT URINALYSIS DIPSTICK
Glucose, UA: NEGATIVE
Nitrite, UA: NEGATIVE
Protein, UA: NEGATIVE

## 2012-11-18 LAB — CBC
HCT: 35.2 % — ABNORMAL LOW (ref 36.0–46.0)
Hemoglobin: 11.4 g/dL — ABNORMAL LOW (ref 12.0–15.0)
RBC: 4.41 MIL/uL (ref 3.87–5.11)
RDW: 13.2 % (ref 11.5–15.5)
WBC: 18.8 10*3/uL — ABNORMAL HIGH (ref 4.0–10.5)

## 2012-11-18 MED ORDER — ONDANSETRON HCL 4 MG/2ML IJ SOLN: 4.0000 mg | Freq: Four times a day (QID) | INTRAMUSCULAR | Status: DC | PRN

## 2012-11-18 MED ORDER — OXYTOCIN 40 UNITS IN LACTATED RINGERS INFUSION - SIMPLE MED
62.5000 mL/h | INTRAVENOUS | Status: DC
  Administered 2012-11-18: 62.5 mL/h via INTRAVENOUS

## 2012-11-18 MED ORDER — OXYTOCIN 40 UNITS IN LACTATED RINGERS INFUSION - SIMPLE MED
1.0000 m[IU]/min | INTRAVENOUS | Status: DC
  Filled 2012-11-18: qty 1000

## 2012-11-18 MED ORDER — SIMETHICONE 80 MG PO CHEW: 80.0000 mg | CHEWABLE_TABLET | ORAL | Status: DC | PRN

## 2012-11-18 MED ORDER — LIDOCAINE HCL (PF) 1 % IJ SOLN
INTRAMUSCULAR | Status: DC | PRN
  Administered 2012-11-18 (×4): 4 mL

## 2012-11-18 MED ORDER — ACETAMINOPHEN 325 MG PO TABS: 650.0000 mg | ORAL_TABLET | ORAL | Status: DC | PRN

## 2012-11-18 MED ORDER — LIDOCAINE HCL (PF) 1 % IJ SOLN
30.0000 mL | INTRAMUSCULAR | Status: DC | PRN
  Filled 2012-11-18: qty 30

## 2012-11-18 MED ORDER — IBUPROFEN 600 MG PO TABS
600.0000 mg | ORAL_TABLET | Freq: Four times a day (QID) | ORAL | Status: DC
  Administered 2012-11-19 – 2012-11-20 (×7): 600 mg via ORAL
  Filled 2012-11-18 (×7): qty 1

## 2012-11-18 MED ORDER — TERBUTALINE SULFATE 1 MG/ML IJ SOLN: 0.2500 mg | Freq: Once | INTRAMUSCULAR | Status: DC | PRN

## 2012-11-18 MED ORDER — WITCH HAZEL-GLYCERIN EX PADS: 1.0000 "application " | MEDICATED_PAD | CUTANEOUS | Status: DC | PRN

## 2012-11-18 MED ORDER — OXYTOCIN BOLUS FROM INFUSION: 500.0000 mL | INTRAVENOUS | Status: DC

## 2012-11-18 MED ORDER — LACTATED RINGERS IV SOLN
INTRAVENOUS | Status: DC
  Administered 2012-11-18: 14:00:00 via INTRAVENOUS

## 2012-11-18 MED ORDER — TETANUS-DIPHTH-ACELL PERTUSSIS 5-2.5-18.5 LF-MCG/0.5 IM SUSP
0.5000 mL | Freq: Once | INTRAMUSCULAR | Status: DC
  Filled 2012-11-18: qty 0.5

## 2012-11-18 MED ORDER — MEASLES, MUMPS & RUBELLA VAC ~~LOC~~ INJ: 0.5000 mL | INJECTION | Freq: Once | SUBCUTANEOUS | Status: DC

## 2012-11-18 MED ORDER — ONDANSETRON HCL 4 MG/2ML IJ SOLN: 4.0000 mg | INTRAMUSCULAR | Status: DC | PRN

## 2012-11-18 MED ORDER — IBUPROFEN 600 MG PO TABS: 600.0000 mg | ORAL_TABLET | Freq: Four times a day (QID) | ORAL | Status: DC | PRN

## 2012-11-18 MED ORDER — DIBUCAINE 1 % RE OINT: 1.0000 "application " | TOPICAL_OINTMENT | RECTAL | Status: DC | PRN

## 2012-11-18 MED ORDER — FENTANYL CITRATE 0.05 MG/ML IJ SOLN: 100.0000 ug | Freq: Once | INTRAMUSCULAR | Status: DC

## 2012-11-18 MED ORDER — SENNOSIDES-DOCUSATE SODIUM 8.6-50 MG PO TABS
2.0000 | ORAL_TABLET | ORAL | Status: DC
  Administered 2012-11-19 (×2): 2 via ORAL
  Filled 2012-11-18 (×2): qty 2

## 2012-11-18 MED ORDER — PHENYLEPHRINE 40 MCG/ML (10ML) SYRINGE FOR IV PUSH (FOR BLOOD PRESSURE SUPPORT)
80.0000 ug | PREFILLED_SYRINGE | INTRAVENOUS | Status: DC | PRN
  Filled 2012-11-18: qty 5

## 2012-11-18 MED ORDER — ONDANSETRON HCL 4 MG PO TABS: 4.0000 mg | ORAL_TABLET | ORAL | Status: DC | PRN

## 2012-11-18 MED ORDER — LACTATED RINGERS IV SOLN
500.0000 mL | Freq: Once | INTRAVENOUS | Status: AC
  Administered 2012-11-18: 500 mL via INTRAVENOUS

## 2012-11-18 MED ORDER — FENTANYL 2.5 MCG/ML BUPIVACAINE 1/10 % EPIDURAL INFUSION (WH - ANES)
14.0000 mL/h | INTRAMUSCULAR | Status: DC | PRN
  Administered 2012-11-18: 14 mL/h via EPIDURAL
  Filled 2012-11-18: qty 125

## 2012-11-18 MED ORDER — DIPHENHYDRAMINE HCL 25 MG PO CAPS: 25.0000 mg | ORAL_CAPSULE | Freq: Four times a day (QID) | ORAL | Status: DC | PRN

## 2012-11-18 MED ORDER — EPHEDRINE 5 MG/ML INJ: 10.0000 mg | INTRAVENOUS | Status: DC | PRN

## 2012-11-18 MED ORDER — DIPHENHYDRAMINE HCL 50 MG/ML IJ SOLN: 12.5000 mg | INTRAMUSCULAR | Status: DC | PRN

## 2012-11-18 MED ORDER — BENZOCAINE-MENTHOL 20-0.5 % EX AERO: 1.0000 "application " | INHALATION_SPRAY | CUTANEOUS | Status: DC | PRN

## 2012-11-18 MED ORDER — LANOLIN HYDROUS EX OINT: TOPICAL_OINTMENT | CUTANEOUS | Status: DC | PRN

## 2012-11-18 MED ORDER — OXYCODONE-ACETAMINOPHEN 5-325 MG PO TABS
1.0000 | ORAL_TABLET | ORAL | Status: DC | PRN
  Administered 2012-11-19 (×3): 1 via ORAL
  Filled 2012-11-18 (×3): qty 1

## 2012-11-18 MED ORDER — PHENYLEPHRINE 40 MCG/ML (10ML) SYRINGE FOR IV PUSH (FOR BLOOD PRESSURE SUPPORT): 80.0000 ug | PREFILLED_SYRINGE | INTRAVENOUS | Status: DC | PRN

## 2012-11-18 MED ORDER — OXYCODONE-ACETAMINOPHEN 5-325 MG PO TABS: 1.0000 | ORAL_TABLET | ORAL | Status: DC | PRN

## 2012-11-18 MED ORDER — LACTATED RINGERS IV SOLN: 500.0000 mL | INTRAVENOUS | Status: DC | PRN

## 2012-11-18 MED ORDER — EPHEDRINE 5 MG/ML INJ
10.0000 mg | INTRAVENOUS | Status: DC | PRN
  Filled 2012-11-18: qty 4

## 2012-11-18 MED ORDER — ZOLPIDEM TARTRATE 5 MG PO TABS: 5.0000 mg | ORAL_TABLET | Freq: Every evening | ORAL | Status: DC | PRN

## 2012-11-18 MED ORDER — PRENATAL MULTIVITAMIN CH
1.0000 | ORAL_TABLET | Freq: Every day | ORAL | Status: DC
  Administered 2012-11-19 – 2012-11-20 (×2): 1 via ORAL
  Filled 2012-11-18 (×2): qty 1

## 2012-11-18 MED ORDER — CITRIC ACID-SODIUM CITRATE 334-500 MG/5ML PO SOLN: 30.0000 mL | ORAL | Status: DC | PRN

## 2012-11-18 NOTE — Anesthesia Procedure Notes (Signed)
Epidural Patient location during procedure: OB Start time: 11/18/2012 2:59 PM  Staffing Performed by: anesthesiologist   Preanesthetic Checklist Completed: patient identified, site marked, surgical consent, pre-op evaluation, timeout performed, IV checked, risks and benefits discussed and monitors and equipment checked  Epidural Patient position: sitting Prep: site prepped and draped and DuraPrep Patient monitoring: continuous pulse ox and blood pressure Approach: midline Injection technique: LOR air  Needle:  Needle type: Tuohy  Needle gauge: 17 G Needle length: 9 cm and 9 Needle insertion depth: 6.5 cm Catheter type: closed end flexible Catheter size: 19 Gauge Catheter at skin depth: 11.5 cm Test dose: negative  Assessment Events: blood not aspirated, injection not painful, no injection resistance, negative IV test and no paresthesia  Additional Notes Discussed risk of headache, infection, bleeding, nerve injury and failed or incomplete block.  Patient voices understanding and wishes to proceed.  Epidural placed easily on first attempt.  No paresthesia.  Patient tolerated procedure well with no apparent complications.  Jasmine December, MDReason for block:procedure for pain

## 2012-11-18 NOTE — MAU Note (Signed)
Contractions have gotten stronger and closer.blood tinged mucous.

## 2012-11-18 NOTE — Progress Notes (Signed)
Work-in. Reg uc's q 6-8 mins since membranes stripped on Fri, was 2/th/-2 then. Went to MAU yest, and was 2/70/-2 and sent home. Reports good fm, denies vb, lof. Requests membrane stripping again. SVE 3/80/-2, membranes stripped, now 4/80/-2. Reviewed labor s/s, fkc. To go to [facility] sooner than later w/ h/o having been sent home multiple times in 1st pregnancy in early labor, and returned 10cm and del in mau. Pt verbalized understanding of all. To return as scheduled on Thurs if still pregnant.

## 2012-11-18 NOTE — Anesthesia Preprocedure Evaluation (Signed)
Anesthesia Evaluation  Patient identified by MRN, date of birth, ID band Patient awake    Reviewed: Allergy & Precautions, H&P , NPO status , Patient's Chart, lab work & pertinent test results, reviewed documented beta blocker date and time   History of Anesthesia Complications Negative for: history of anesthetic complications  Airway Mallampati: III TM Distance: >3 FB Neck ROM: full    Dental  (+) Teeth Intact   Pulmonary neg pulmonary ROS,  breath sounds clear to auscultation        Cardiovascular negative cardio ROS  Rhythm:regular Rate:Normal     Neuro/Psych negative neurological ROS  negative psych ROS   GI/Hepatic Neg liver ROS, GERD-  Medicated,  Endo/Other  Obesity - BMI 39.1  Renal/GU negative Renal ROS     Musculoskeletal   Abdominal   Peds  Hematology negative hematology ROS (+)   Anesthesia Other Findings   Reproductive/Obstetrics (+) Pregnancy                           Anesthesia Physical Anesthesia Plan  ASA: II  Anesthesia Plan: Epidural   Post-op Pain Management:    Induction:   Airway Management Planned:   Additional Equipment:   Intra-op Plan:   Post-operative Plan:   Informed Consent: I have reviewed the patients History and Physical, chart, labs and discussed the procedure including the risks, benefits and alternatives for the proposed anesthesia with the patient or authorized representative who has indicated his/her understanding and acceptance.     Plan Discussed with:   Anesthesia Plan Comments:         Anesthesia Quick Evaluation

## 2012-11-18 NOTE — Progress Notes (Signed)
Bloody, mucus discharge

## 2012-11-18 NOTE — H&P (Signed)
Julie Cummings is a 22 y.o. female G3P1011 with IUP at [redacted]w[redacted]d by first trimester ultrasound ([redacted]w[redacted]d) presenting for rule out labor. Pt states she has been having regular, every 2 minutes contractions, associated with no vaginal bleeding.  Membranes are intact, with active fetal movement. Contractions started Saturday 10/18 and have since become increasingly strong and more frequent. She was evaluated in MAU yesterday and was 2/70/-2. She was then seen in clinic this morning and was 3-4/80/-2. She reports her contractions are now very painful and occurring every 2 mins. Of note pt has a history of prior precipitous delivery in MAU with her first pregnancy. She denies fevers, chills, nausea, vomiting, dysuria, headaches, blurry vision and leg swelling.   [facility] at [facility] since first trimester.   Prenatal History/Complications:  Past Medical History: Past Medical History  Diagnosis Date  . No pertinent past medical history   . Gall stones   . History of UTI     Past Surgical History: Past Surgical History  Procedure Laterality Date  . Tonsillectomy    . Cholecystectomy  12/2010  . Ercp      Obstetrical History: OB History   Grav Para Term Preterm Abortions TAB SAB Ect Mult Living   3 1 1  0 1 0 1 0 0 1      Gynecological History: OB History   Grav Para Term Preterm Abortions TAB SAB Ect Mult Living   3 1 1  0 1 0 1 0 0 1      Social History: History   Social History  . Marital Status: Married    Spouse Name: N/A    Number of Children: N/A  . Years of Education: N/A   Social History Main Topics  . Smoking status: Former Smoker -- .5 years    Types: Cigarettes  . Smokeless tobacco: Never Used     Comment: quit in 2011  . Alcohol Use: No  . Drug Use: No  . Sexual Activity: Yes    Birth Control/ Protection: None   Other Topics Concern  . None   Social History Narrative  . None    Family History: Family History  Problem Relation Age of Onset  . Hypertension  Sister   . CAD Maternal Grandfather   . Diabetes Maternal Grandfather   . Stroke Maternal Grandfather   . Heart attack Maternal Grandfather   . Heart disease Maternal Grandfather   . Diabetes Paternal Grandfather   . Heart disease Paternal Grandfather   . Stroke Paternal Grandfather   . Heart attack Paternal Grandfather   . Hypertension Other     grandparents    Allergies: Allergies  Allergen Reactions  . Morphine And Related Other (See Comments)    Racing heart; when patient had gallbladder taken out in December 2012, MD told patient that her heart was racing and not to take Morphine again.    Prescriptions prior to admission  Medication Sig Dispense Refill  . metoCLOPramide (REGLAN) 10 MG tablet Take 10 mg by mouth daily as needed (nausea).       Marland Kitchen omeprazole (PRILOSEC) 40 MG capsule Take 1 capsule (40 mg total) by mouth daily.  30 capsule  3  . Prenatal Vit-Fe Fumarate-FA (PRENATAL MULTIVITAMIN) TABS Take 1 tablet by mouth daily at 12 noon.         Review of Systems  Per HPI    Blood pressure 133/81, pulse 112, temperature 97.9 F (36.6 C), temperature source Oral, resp. rate 22, height 5'  4" (1.626 m), weight 103.874 kg (229 lb), last menstrual period 02/10/2012. General appearance: alert, cooperative, no distress and appears uncomfortable with contractions Lungs: clear to auscultation bilaterally Heart: regular rate and rhythm Abdomen: soft, non-tender; bowel sounds normal Extremities: Homans sign is negative, no sign of DVT Presentation: cephalic Fetal monitoringBaseline: 130 bpm, Variability: Good {> 6 bpm), Accelerations: Reactive and Decelerations: Absent Uterine activity q2 mins  Dilation: 5 Effacement (%): 80;90 Station: -1 Exam by:: Jolynn   Prenatal labs: ABO, Rh: B/Negative/-- (03/04 0000) Antibody: NEG (08/05 0926) Rubella:   RPR: NON REAC (08/05 0926)  HBsAg: Negative (03/04 0000)  HIV: NON REACTIVE (08/05 0926)  GBS: NEGATIVE (09/30 1536)  2  hr Glucola 76/82/115 Genetic screening declined Anatomy US Normal  .resultrcnt[24h  Assessment: Julie Cummings is a 22 y.o. G3P1011 with an IUP at [redacted]w[redacted]d presenting for term labor. Her cervical exam progressed from 3cm in clinic this morning to 5 cm in MAU today. She is painfully and frequently contracting.   Plan: Admit to labor and delivery. Routine labs ordered. Clear liquids only.  FWB: Cat I EFW: 8 lbs by Leopolds GBS negative.  Desires epidural for pain control. Requesting IV pain medication now. Fentanyl 100 mcg x 1 dose administered.  Rh negative and will need postpartum Rhogam.  Uptodate with Influenza vaccination. Needs Tdap.   Hal Neer, MD Family Medicine, PGY-3   I have seen and examined this patient and agree with above documentation in the resident's note. Pt in likely in active labor. Admit with routine orders. Anticipate SVD. She is breast feeding and wants nexplanon   Rulon Abide, M.D. [facility] Fellow 11/18/2012 2:58 PM

## 2012-11-18 NOTE — Patient Instructions (Signed)

## 2012-11-18 NOTE — MAU Note (Signed)
Patient states she is having contractions every 3-4 minutes with mucus discharge. Denies bleeding or leaking and reports good fetal movement.  

## 2012-11-19 LAB — CBC
HCT: 28.6 % — ABNORMAL LOW (ref 36.0–46.0)
MCHC: 31.8 g/dL (ref 30.0–36.0)
RDW: 13.5 % (ref 11.5–15.5)
WBC: 18 10*3/uL — ABNORMAL HIGH (ref 4.0–10.5)

## 2012-11-19 MED ORDER — RHO D IMMUNE GLOBULIN 1500 UNIT/2ML IJ SOLN
300.0000 ug | Freq: Once | INTRAMUSCULAR | Status: AC
Start: 2012-11-19 — End: 2012-11-19
  Administered 2012-11-19: 300 ug via INTRAMUSCULAR
  Filled 2012-11-19: qty 2

## 2012-11-19 NOTE — Lactation Note (Signed)
This note was copied from the chart of Julie Cummings. Lactation Consultation Note Mom requesting LC, Baby asleep and not cuing.  Mom demonstrates hand expression with visible colostrum.  Undressed baby, now cueing.  Encouraged mom to do skin to skin to help encourage cueing for feedings.  Mom assisted minimally with football position on left breast.  Support pillows used to decrease mom's wrist pain.  Baby latched with wide flanged lips and slow rhythmic sucking.  Mom encouraged with breast massage.  Few swallows heard. Baby latched for 15 plus minutes. Comfort gels previously given with mild nipple discomfort with previous latches. Told mom not to use nipple creams during comfort gel therapy.  Mom denies pain or concerns at this time.  Patient Name: Julie Cummings [id number] Date: 11/19/2012 Reason for consult: Initial assessment;Breast/nipple pain   Maternal Data Has patient been taught Hand Expression?: Yes Does the patient have breastfeeding experience prior to this delivery?: Yes  Feeding Feeding Type: Breast Fed Length of feed:  (15 minutes plus)  LATCH Score/Interventions Latch: Grasps breast easily, tongue down, lips flanged, rhythmical sucking. Intervention(s): Assist with latch;Breast massage;Breast compression  Audible Swallowing: A few with stimulation Intervention(s): Skin to skin;Hand expression;Alternate breast massage  Type of Nipple: Everted at rest and after stimulation  Comfort (Breast/Nipple): Filling, red/small blisters or bruises, mild/mod discomfort  Problem noted: Mild/Moderate discomfort Interventions  (Cracked/bleeding/bruising/blister): Other (comment) Interventions (Mild/moderate discomfort): Comfort gels  Hold (Positioning): Assistance needed to correctly position infant at breast and maintain latch.  LATCH Score: 7  Lactation Tools Discussed/Used     Consult Status Consult Status: Follow-up Date: 11/20/12 Follow-up type:  In-patient    Jannifer Rodney 11/19/2012, 6:26 PM

## 2012-11-19 NOTE — Anesthesia Postprocedure Evaluation (Signed)
  Anesthesia Post-op Note  Anesthesia Post Note  Patient: Julie Cummings  Procedure(s) Performed: * No procedures listed *  Anesthesia type: Epidural  Patient location: Mother/Baby  Post pain: Pain level controlled  Post assessment: Post-op Vital signs reviewed  Last Vitals:  Filed Vitals:   11/19/12 0620  BP: 103/67  Pulse: 81  Temp: 36.3 C  Resp: 18    Post vital signs: Reviewed  Level of consciousness:alert  Complications: No apparent anesthesia complications

## 2012-11-19 NOTE — Progress Notes (Signed)
Post Partum Day 1 Subjective: no complaints, up ad lib, voiding and tolerating PO  Objective: Blood pressure 114/67, pulse 87, temperature 97.9 F (36.6 C), temperature source Oral, resp. rate 20, height 5\' 4"  (1.626 m), weight 103.874 kg (229 lb), last menstrual period 02/10/2012, SpO2 99.00%, unknown if currently breastfeeding.  Physical Exam:  General: alert, cooperative and no distress Lochia: appropriate Uterine Fundus: firm Incision: na DVT Evaluation: No evidence of DVT seen on physical exam. No cords or calf tenderness. No significant calf/ankle edema.   Recent Labs  11/18/12 1415  HGB 11.4*  HCT 35.2*    Assessment/Plan: Plan for discharge tomorrow, Breastfeeding, Lactation consult and Contraception nexplanon   LOS: 1 day   Julie Cummings L 11/19/2012, 6:24 AM

## 2012-11-20 LAB — RH IG WORKUP (INCLUDES ABO/RH)
ABO/RH(D): B NEG
Antibody Screen: POSITIVE
Gestational Age(Wks): 39.4
Unit division: 0

## 2012-11-20 MED ORDER — IBUPROFEN 600 MG PO TABS: 600.0000 mg | ORAL_TABLET | Freq: Four times a day (QID) | ORAL | Status: DC

## 2012-11-20 MED ORDER — LANOLIN HYDROUS EX OINT: 1.0000 "application " | TOPICAL_OINTMENT | CUTANEOUS | Status: DC | PRN

## 2012-11-20 MED ORDER — TETANUS-DIPHTH-ACELL PERTUSSIS 5-2.5-18.5 LF-MCG/0.5 IM SUSP
0.5000 mL | Freq: Once | INTRAMUSCULAR | Status: AC
  Administered 2012-11-20: 0.5 mL via INTRAMUSCULAR
  Filled 2012-11-20: qty 0.5

## 2012-11-20 NOTE — Discharge Summary (Signed)
Obstetric Discharge Summary Reason for Admission: onset of labor Prenatal Procedures: NST Intrapartum Procedures: spontaneous vaginal delivery Postpartum Procedures: Rho(D) Ig Complications-Operative and Postpartum: none Hemoglobin  Date Value Range Status  11/19/2012 9.1* 12.0 - 15.0 g/dL Final     DELTA CHECK NOTED     REPEATED TO VERIFY     HCT  Date Value Range Status  11/19/2012 28.6* 36.0 - 46.0 % Final    Physical Exam:  General: alert, cooperative and no distress Lochia: appropriate Uterine Fundus: firm Incision: na DVT Evaluation: No evidence of DVT seen on physical exam. No cords or calf tenderness. No significant calf/ankle edema.  Discharge Diagnoses: Term Pregnancy-delivered  Discharge Information: Date: 11/20/2012 Activity: pelvic rest Diet: routine Medications: PNV and Ibuprofen Condition: stable Instructions: refer to practice specific booklet Discharge to: home Follow-up Information   Follow up with [facility]. Schedule an appointment as soon as possible for a visit in 4 weeks. (for post partum visit)    Specialty:  Obstetrics and Gynecology   Contact information:   [address] [place] [address] [phone]      Newborn Data: Live born female  Birth Weight: 8 lb 14.7 oz (4045 g) APGAR: 9, 9  Home with mother.  Pt presented in spontaneous labor and progressed well to deliver a viable female with spontaneous cry via NSVD.  No complications postpartum. She received rhogam.  She is breast feeding and desires nexplanon for contraception.   Julie Cummings L 11/20/2012, 10:05 AM

## 2012-11-20 NOTE — Discharge Summary (Signed)
Attestation of Attending Supervision of Advanced Practitioner (CNM/NP): Evaluation and management procedures were performed by the Advanced Practitioner under my supervision and collaboration.  I have reviewed the Advanced Practitioner's note and chart, and I agree with the management and plan.  Harika Laidlaw 11/20/2012 11:17 AM   

## 2012-11-20 NOTE — Lactation Note (Signed)
This note was copied from the chart of Julie Bayle Jenson. Lactation Consultation Note  Patient Name: Julie Cummings [id number] Date: 11/20/2012 Reason for consult: Follow-up assessment Mom reports some mild tenderness with breastfeeding, denies any breakdown. She has comfort gels, requests shells, demonstrated how to use. Advised to apply EBM to sore nipples. Mom had pacifier at bedside. Discussed risk of pacifier use this early and affect on latch. Reviewed positioning and how to obtain a deep latch. Care for sore nipples reviewed. Engorgement care reviewed if needed. Advised of OP services and support group. Advised to call if would like LC assist before d/c. Mom reports low milk supply with 1st baby. Advised oatmeal, supplements reviewed.   Maternal Data    Feeding Feeding Type: Breast Fed Length of feed: 35 min  LATCH Score/Interventions             Problem noted: Mild/Moderate discomfort Interventions  (Cracked/bleeding/bruising/blister): Expressed breast milk to nipple Interventions (Mild/moderate discomfort): Comfort gels  Intervention(s): Breastfeeding basics reviewed     Lactation Tools Discussed/Used Tools: Comfort gels;Shells Shell Type: Inverted   Consult Status Consult Status: Complete Date: 11/20/12 Follow-up type: In-patient    Julie Cummings 11/20/2012, 10:34 AM

## 2012-11-21 ENCOUNTER — Encounter: Payer: BC Managed Care – PPO | Admitting: Obstetrics and Gynecology

## 2012-11-29 ENCOUNTER — Encounter: Payer: BC Managed Care – PPO | Admitting: Obstetrics and Gynecology

## 2013-01-01 ENCOUNTER — Ambulatory Visit (INDEPENDENT_AMBULATORY_CARE_PROVIDER_SITE_OTHER): Payer: BC Managed Care – PPO | Admitting: Advanced Practice Midwife

## 2013-01-01 ENCOUNTER — Encounter: Payer: Self-pay | Admitting: Advanced Practice Midwife

## 2013-01-01 NOTE — Progress Notes (Signed)
  Julie Cummings is a 22 y.o. who presents for a postpartum visit. She is 6 weeks postpartum following a spontaneous vaginal delivery. I have fully reviewed the prenatal and intrapartum course. The delivery was at 39.4 gestational weeks.  Anesthesia: epidural. Postpartum course has been uneventful. Baby's course has been uneventful. Baby is feeding by bottle. Bleeding: first period 12/27/12. Bowel function is normal. Bladder function is normal. Patient is sexually active. Contraception method is condoms. Had intercourse on last day of period with a condom. No sex until nexplanon. Postpartum depression screening: negative.    Review of Systems   Constitutional: Negative for fever and chills Eyes: Negative for visual disturbances Respiratory: Negative for shortness of breath, dyspnea Cardiovascular: Negative for chest pain or palpitations  Gastrointestinal: Negative for vomiting, diarrhea and constipation Genitourinary: Negative for dysuria and urgency Musculoskeletal: Negative for back pain, joint pain, myalgias  Neurological: Negative for dizziness and headaches   Objective:     Filed Vitals:   01/01/13 0922  BP: 110/60   General:  alert, cooperative and no distress   Breasts:  negative  Lungs: clear to auscultation bilaterally  Heart:  regular rate and rhythm  Abdomen: Soft, nontender   Vulva:  normal  Vagina: normal vagina  Cervix:  closed  Corpus: Well involuted     Rectal Exam: no hemorrhoids        Assessment:    normal postpartum exam.  Plan:    1. Contraception: Nexplanon 2. Follow up in: asap for nexplanon or as needed.

## 2013-01-06 ENCOUNTER — Encounter: Payer: Self-pay | Admitting: Women's Health

## 2013-01-06 ENCOUNTER — Ambulatory Visit: Payer: BC Managed Care – PPO | Admitting: Women's Health

## 2013-01-06 VITALS — BP 120/70 | Ht 64.0 in | Wt 210.0 lb

## 2013-01-06 LAB — POCT URINE PREGNANCY: Preg Test, Ur: NEGATIVE

## 2013-01-16 ENCOUNTER — Ambulatory Visit (INDEPENDENT_AMBULATORY_CARE_PROVIDER_SITE_OTHER): Payer: BC Managed Care – PPO | Admitting: Advanced Practice Midwife

## 2013-01-16 ENCOUNTER — Encounter: Payer: Self-pay | Admitting: Advanced Practice Midwife

## 2013-01-16 ENCOUNTER — Other Ambulatory Visit: Payer: BC Managed Care – PPO

## 2013-01-16 VITALS — BP 130/64 | Ht 64.0 in | Wt 212.0 lb

## 2013-01-16 DIAGNOSIS — Z3046 Encounter for surveillance of implantable subdermal contraceptive: Secondary | ICD-10-CM

## 2013-01-16 DIAGNOSIS — Z3200 Encounter for pregnancy test, result unknown: Secondary | ICD-10-CM

## 2013-01-16 DIAGNOSIS — Z30017 Encounter for initial prescription of implantable subdermal contraceptive: Secondary | ICD-10-CM

## 2013-01-16 LAB — HCG, QUANTITATIVE, PREGNANCY: hCG, Beta Chain, Quant, S: <1 m[IU]/mL

## 2013-01-16 NOTE — Progress Notes (Signed)
Julie Cummings is a 22 y.o. year old Caucasian female here for Nexplanon insertion.  Her LMP was postpartum , and her pregnancy test today was negative.  Risks/benefits/side effects of Nexplanon have been discussed and her questions have been answered.  Specifically, a failure rate of 01/998 has been reported, with an increased failure rate if pt takes St. John's Wort and/or antiseizure medicaitons.  Julie Cummings is aware of the common side effect of irregular bleeding, which the incidence of decreases over time.  Her left arm, approximatly 4 inches proximal from the elbow, was cleansed with alcohol and anesthetized with 2cc of 2% Lidocaine.  The area was cleansed again and the Nexplanon was inserted without difficulty.  A pressure bandage was applied.  Pt was instructed to remove pressure bandage in a few hours, and keep insertion site covered with a bandaid for 3 days.  Back up contraception was recommended for 2 weeks.  Follow-up scheduled PRN problems  CRESENZO-DISHMAN,Annalisa Colonna 01/16/2013 3:01 PM

## 2013-03-06 ENCOUNTER — Telehealth: Payer: Self-pay | Admitting: *Deleted

## 2013-03-06 NOTE — Telephone Encounter (Signed)
Pt states that she is on Doxycycline and wants to know if it will effect the nexplanon. I spoke with JAG about this and she advised the pt to use back up. Pt verbalized understanding.

## 2013-04-15 ENCOUNTER — Ambulatory Visit (INDEPENDENT_AMBULATORY_CARE_PROVIDER_SITE_OTHER): Payer: BC Managed Care – PPO | Admitting: Obstetrics & Gynecology

## 2013-04-15 ENCOUNTER — Encounter: Payer: Self-pay | Admitting: Obstetrics & Gynecology

## 2013-04-15 VITALS — BP 100/60 | Wt 211.0 lb

## 2013-04-15 DIAGNOSIS — Z3046 Encounter for surveillance of implantable subdermal contraceptive: Secondary | ICD-10-CM

## 2013-04-15 MED ORDER — DESOGESTREL-ETHINYL ESTRADIOL 0.15-0.02/0.01 MG (21/5) PO TABS: 1.0000 | ORAL_TABLET | Freq: Every day | ORAL | Status: DC

## 2013-04-15 NOTE — Progress Notes (Signed)
Patient ID: Julie Cummings, female   DOB: 1990-07-31, 23 y.o.   MRN: [id number] Norplant in since 12/2012 Pt with a multitude of symptoms which may or may not be related ot the use of the Nexplanon  After talking decided the best course of action is removsl  Removed without difficulty after lidocaine injected and incision made  Past Medical History  Diagnosis Date  . No pertinent past medical history   . Gall stones   . History of UTI   . GERD (gastroesophageal reflux disease)     Past Surgical History  Procedure Laterality Date  . Tonsillectomy    . Cholecystectomy  12/2010  . Ercp      OB History   Grav Para Term Preterm Abortions TAB SAB Ect Mult Living   3 2 2  0 1 0 1 0 0 2      Allergies  Allergen Reactions  . Morphine And Related Other (See Comments)    Racing heart; when patient had gallbladder taken out in December 2012, MD told patient that her heart was racing and not to take Morphine again.    History   Social History  . Marital Status: Married    Spouse Name: N/A    Number of Children: N/A  . Years of Education: N/A   Social History Main Topics  . Smoking status: Former Smoker -- .5 years    Types: Cigarettes  . Smokeless tobacco: Never Used     Comment: quit in 2011  . Alcohol Use: No  . Drug Use: No  . Sexual Activity: Not Currently    Birth Control/ Protection: Implant   Other Topics Concern  . None   Social History Narrative  . None    Family History  Problem Relation Age of Onset  . Hypertension Sister   . CAD Maternal Grandfather   . Diabetes Maternal Grandfather   . Stroke Maternal Grandfather   . Heart attack Maternal Grandfather   . Heart disease Maternal Grandfather   . Diabetes Paternal Grandfather   . Heart disease Paternal Grandfather   . Stroke Paternal Grandfather   . Heart attack Paternal Grandfather   . Hypertension Other     grandparents

## 2013-12-01 ENCOUNTER — Encounter: Payer: Self-pay | Admitting: Obstetrics & Gynecology

## 2014-09-15 ENCOUNTER — Ambulatory Visit: Payer: Medicaid Other | Admitting: Adult Health

## 2014-09-25 ENCOUNTER — Other Ambulatory Visit: Payer: Self-pay | Admitting: Obstetrics and Gynecology

## 2014-09-25 DIAGNOSIS — O3680X0 Pregnancy with inconclusive fetal viability, not applicable or unspecified: Secondary | ICD-10-CM

## 2014-09-28 ENCOUNTER — Ambulatory Visit (INDEPENDENT_AMBULATORY_CARE_PROVIDER_SITE_OTHER): Payer: BLUE CROSS/BLUE SHIELD

## 2014-09-28 DIAGNOSIS — O3680X0 Pregnancy with inconclusive fetal viability, not applicable or unspecified: Secondary | ICD-10-CM | POA: Diagnosis not present

## 2014-09-28 DIAGNOSIS — Z3A01 Less than 8 weeks gestation of pregnancy: Secondary | ICD-10-CM

## 2014-09-28 NOTE — Progress Notes (Signed)
[name] 6+5 wks single IUP w/ys,pos fht 138bpm,normal ov's bilat,crl 10.36mm

## 2014-10-07 ENCOUNTER — Ambulatory Visit (INDEPENDENT_AMBULATORY_CARE_PROVIDER_SITE_OTHER): Payer: BLUE CROSS/BLUE SHIELD | Admitting: Women's Health

## 2014-10-07 ENCOUNTER — Encounter: Payer: Self-pay | Admitting: Women's Health

## 2014-10-07 VITALS — BP 118/66 | HR 84 | Wt 220.0 lb

## 2014-10-07 DIAGNOSIS — Z0283 Encounter for blood-alcohol and blood-drug test: Secondary | ICD-10-CM

## 2014-10-07 DIAGNOSIS — Z1389 Encounter for screening for other disorder: Secondary | ICD-10-CM

## 2014-10-07 DIAGNOSIS — Z3490 Encounter for supervision of normal pregnancy, unspecified, unspecified trimester: Secondary | ICD-10-CM | POA: Insufficient documentation

## 2014-10-07 DIAGNOSIS — Z6791 Unspecified blood type, Rh negative: Secondary | ICD-10-CM | POA: Insufficient documentation

## 2014-10-07 DIAGNOSIS — O99891 Other specified diseases and conditions complicating pregnancy: Secondary | ICD-10-CM | POA: Insufficient documentation

## 2014-10-07 DIAGNOSIS — Z3A08 8 weeks gestation of pregnancy: Secondary | ICD-10-CM | POA: Diagnosis not present

## 2014-10-07 DIAGNOSIS — F419 Anxiety disorder, unspecified: Secondary | ICD-10-CM | POA: Insufficient documentation

## 2014-10-07 DIAGNOSIS — Z331 Pregnant state, incidental: Secondary | ICD-10-CM

## 2014-10-07 DIAGNOSIS — Z3481 Encounter for supervision of other normal pregnancy, first trimester: Secondary | ICD-10-CM | POA: Diagnosis not present

## 2014-10-07 DIAGNOSIS — O26899 Other specified pregnancy related conditions, unspecified trimester: Secondary | ICD-10-CM

## 2014-10-07 DIAGNOSIS — Z3491 Encounter for supervision of normal pregnancy, unspecified, first trimester: Secondary | ICD-10-CM

## 2014-10-07 DIAGNOSIS — Z8759 Personal history of other complications of pregnancy, childbirth and the puerperium: Secondary | ICD-10-CM | POA: Insufficient documentation

## 2014-10-07 DIAGNOSIS — O360111 Maternal care for anti-D [Rh] antibodies, first trimester, fetus 1: Secondary | ICD-10-CM

## 2014-10-07 DIAGNOSIS — Z8659 Personal history of other mental and behavioral disorders: Secondary | ICD-10-CM | POA: Insufficient documentation

## 2014-10-07 DIAGNOSIS — Z369 Encounter for antenatal screening, unspecified: Secondary | ICD-10-CM

## 2014-10-07 DIAGNOSIS — O9989 Other specified diseases and conditions complicating pregnancy, childbirth and the puerperium: Secondary | ICD-10-CM

## 2014-10-07 HISTORY — DX: Other specified pregnancy related conditions, unspecified trimester: O26.899

## 2014-10-07 HISTORY — DX: Unspecified blood type, rh negative: Z67.91

## 2014-10-07 NOTE — Progress Notes (Signed)
Subjective:  Julie Cummings is a 24 y.o. G78P2012 Caucasian female at [redacted]w[redacted]d by LMP c/w 6wk u/s, being seen today for her first obstetrical visit.  Her obstetrical history is significant for term svb x 2- 2nd delivery in mau, ? pp eclampsia vs. syncopal episode after 1st delivery- bp & labs were normal, so likely syncopal episode, no bp/pre-e issues w/ 2nd pregnancy.  Pregnancy history fully reviewed.  Patient reports nausea- requests meds. Anxiety- had PPD after last baby didn't tell anyone- anxiety has continued since then, grandpa passed away in 07-Nov-2022- doesn't want to take any meds- but would like anxiety relieving measure. Denies vb, cramping, uti s/s, abnormal/malodorous vag d/c, or vulvovaginal itching/irritation.  BP 118/66 mmHg  Pulse 84  Wt 220 lb (99.791 kg)  LMP 08/12/2014 (Exact Date)  HISTORY: OB History  Gravida Para Term Preterm AB SAB TAB Ectopic Multiple Living  4 2 2  0 1 1 0 0 0 2    # Outcome Date GA Lbr Len/2nd Weight Sex Delivery Anes PTL Lv  4 Current           3 Term 11/18/12 [redacted]w[redacted]d 07:57 / 00:12 8 lb 14.7 oz (4.045 kg) M Vag-Spont EPI N Y     Comments: none  2 Term 12/01/10 [redacted]w[redacted]d 36:56 / 00:15 7 lb 7.4 oz (3.385 kg) M Vag-Spont Local N Y     Comments: tx for PP Eclampsia, but most likely had syncopal episode (RN witnessed "seizure activity" but nothing in Hx, labs, etc indicate actual seizure)  1 SAB 05/2008 [redacted]w[redacted]d    SAB        Past Medical History  Diagnosis Date  . No pertinent past medical history   . Gall stones   . History of UTI   . GERD (gastroesophageal reflux disease)    Past Surgical History  Procedure Laterality Date  . Tonsillectomy    . Cholecystectomy  12/2010  . Ercp     Family History  Problem Relation Age of Onset  . Hypertension Sister   . CAD Maternal Grandfather   . Diabetes Maternal Grandfather   . Stroke Maternal Grandfather   . Heart attack Maternal Grandfather   . Heart disease Maternal Grandfather   . Diabetes Paternal  Grandfather   . Heart disease Paternal Grandfather   . Stroke Paternal Grandfather   . Heart attack Paternal Grandfather   . Hypertension Other     grandparents  . Diabetes Mother   . Thyroid disease Maternal Grandmother     Exam   System:     General: Well developed & nourished, no acute distress   Skin: Warm & dry, normal coloration and turgor, no rashes   Neurologic: Alert & oriented, normal mood   Cardiovascular: Regular rate & rhythm   Respiratory: Effort & rate normal, LCTAB, acyanotic   Abdomen: Soft, non tender   Extremities: normal strength, tone  Thin prep pap smear neg 03/2012   Assessment:   Pregnancy: [id number] Patient Active Problem List   Diagnosis Date Noted  . Supervision of normal pregnancy 10/07/2014    Priority: High  . Rh negative state in antepartum period 10/07/2014  . History of eclampsia 10/07/2014  . Encounter for Nexplanon removal 01/16/2013    [redacted]w[redacted]d [id number] New OB visit Nausea ?pp eclampsia vs syncopal episode 1st baby Anxiety  Plan:  Initial labs drawn Continue prenatal vitamins Problem list reviewed and updated Reviewed n/v relief measures and warning s/s to report Gave 2 diclegis samples, pt to call  when [facility] comes through Reviewed anxiety relief measures Reviewed recommended weight gain based on pre-gravid BMI Encouraged well-balanced diet Genetic Screening discussed Integrated Screen: declined Cystic fibrosis screening discussed declined Ultrasound discussed; fetal survey: requested Follow up in 4 weeks for visit [facility] completed  Marge Duncans CNM, [facility] 10/07/2014 4:56 PM

## 2014-10-07 NOTE — Patient Instructions (Signed)

## 2014-10-08 LAB — VARICELLA ZOSTER ANTIBODY, IGG: VARICELLA: 820 {index} (ref >165)

## 2014-10-08 LAB — CBC
Hematocrit: 40.4 % (ref 34.0–46.6)
Hemoglobin: 14.1 g/dL (ref 11.1–15.9)
MCH: 29.9 pg (ref 26.6–33.0)
MCHC: 34.9 g/dL (ref 31.5–35.7)
MCV: 86 fL (ref 79–97)
Platelets: 264 10*3/uL (ref 150–379)
RBC: 4.72 x10E6/uL (ref 3.77–5.28)
RDW: 13.8 % (ref 12.3–15.4)
WBC: 8.9 10*3/uL (ref 3.4–10.8)

## 2014-10-08 LAB — RUBELLA SCREEN: Rubella Antibodies, IGG: 2.15 index (ref >0.99)

## 2014-10-08 LAB — URINALYSIS, ROUTINE W REFLEX MICROSCOPIC
Bilirubin, UA: NEGATIVE
GLUCOSE, UA: NEGATIVE
LEUKOCYTES UA: NEGATIVE
Nitrite, UA: NEGATIVE
RBC, UA: NEGATIVE
Urobilinogen, Ur: 0.2 mg/dL (ref 0.2–1.0)
pH, UA: 6 (ref 5.0–7.5)

## 2014-10-08 LAB — PMP SCREEN PROFILE (10S), URINE
Amphetamine Screen, Ur: NEGATIVE ng/mL
BARBITURATE SCRN UR: NEGATIVE ng/mL
Benzodiazepine Screen, Urine: NEGATIVE ng/mL
CREATININE(CRT), U: 369.1 mg/dL — AB (ref 20.0–300.0)
Cannabinoids Ur Ql Scn: NEGATIVE ng/mL
Cocaine(Metab.)Screen, Urine: NEGATIVE ng/mL
METHADONE SCREEN, URINE: NEGATIVE ng/mL
OPIATE SCRN UR: NEGATIVE ng/mL
Oxycodone+Oxymorphone Ur Ql Scn: NEGATIVE ng/mL
PCP SCRN UR: NEGATIVE ng/mL
PROPOXYPHENE SCREEN: NEGATIVE ng/mL
Ph of Urine: 5.4 (ref 4.5–8.9)

## 2014-10-08 LAB — ABO/RH: Rh Factor: NEGATIVE

## 2014-10-08 LAB — HEPATITIS B SURFACE ANTIGEN: Hepatitis B Surface Ag: NEGATIVE

## 2014-10-08 LAB — RPR: RPR Ser Ql: NONREACTIVE

## 2014-10-08 LAB — URINE CULTURE

## 2014-10-08 LAB — HIV ANTIBODY (ROUTINE TESTING W REFLEX): HIV Screen 4th Generation wRfx: NONREACTIVE

## 2014-10-08 LAB — ANTIBODY SCREEN: Antibody Screen: NEGATIVE

## 2014-10-09 LAB — GC/CHLAMYDIA PROBE AMP
Chlamydia trachomatis, NAA: NEGATIVE
Neisseria gonorrhoeae by PCR: NEGATIVE

## 2014-10-19 ENCOUNTER — Telehealth: Payer: Self-pay | Admitting: Obstetrics & Gynecology

## 2014-10-19 MED ORDER — DOXYLAMINE-PYRIDOXINE 10-10 MG PO TBEC: 10.0000 mg | DELAYED_RELEASE_TABLET | ORAL | Status: DC

## 2014-10-19 NOTE — Telephone Encounter (Signed)
Pt requesting samples of Diclegis. Pt states still waiting on her MCD card. Diclegis 2 samples left at front desk for pt to pick up.

## 2014-10-27 ENCOUNTER — Telehealth: Payer: Self-pay | Admitting: Women's Health

## 2014-10-27 NOTE — Telephone Encounter (Signed)
Pt states since Saturday has been having N/V/D, not even keeping water down most of the time.  Pt is taking Diclegis and it helps some.  Advised pt to push fluids, try ginger ale, try eating small amounts of bald foods every couple of hours and can try ginger extract or B-natal Thera Pop.  Pt advised if not improving or getting worse call us back.  Pt verbalized understanding

## 2014-10-28 ENCOUNTER — Encounter: Payer: Self-pay | Admitting: Obstetrics & Gynecology

## 2014-10-28 ENCOUNTER — Ambulatory Visit (INDEPENDENT_AMBULATORY_CARE_PROVIDER_SITE_OTHER): Payer: BLUE CROSS/BLUE SHIELD | Admitting: Obstetrics & Gynecology

## 2014-10-28 VITALS — BP 108/70 | HR 80 | Wt 212.0 lb

## 2014-10-28 DIAGNOSIS — Z3A11 11 weeks gestation of pregnancy: Secondary | ICD-10-CM

## 2014-10-28 DIAGNOSIS — R42 Dizziness and giddiness: Secondary | ICD-10-CM

## 2014-10-28 DIAGNOSIS — Z331 Pregnant state, incidental: Secondary | ICD-10-CM

## 2014-10-28 DIAGNOSIS — O210 Mild hyperemesis gravidarum: Secondary | ICD-10-CM

## 2014-10-28 DIAGNOSIS — Z1389 Encounter for screening for other disorder: Secondary | ICD-10-CM

## 2014-10-28 LAB — POCT URINALYSIS DIPSTICK
Blood, UA: NEGATIVE
GLUCOSE UA: NEGATIVE
LEUKOCYTES UA: NEGATIVE
NITRITE UA: NEGATIVE

## 2014-10-28 MED ORDER — OMEPRAZOLE 20 MG PO CPDR: 20.0000 mg | DELAYED_RELEASE_CAPSULE | Freq: Every day | ORAL | Status: DC

## 2014-10-28 MED ORDER — METOCLOPRAMIDE HCL 10 MG PO TABS: 10.0000 mg | ORAL_TABLET | Freq: Three times a day (TID) | ORAL | Status: DC

## 2014-10-28 NOTE — Progress Notes (Signed)
Work in ob appointment  [redacted]w[redacted]d Estimated Date of Delivery: 05/19/15  As with her previous pregnancies having trouble keeping anything down As a result dizzy light headed  Blood pressure 108/70, pulse 80, weight 212 lb (96.163 kg), last menstrual period 08/12/2014, unknown if currently breastfeeding.  Abdomen soft non tender  Begin what worked previously Hotel manager  kep scheduled

## 2014-11-04 ENCOUNTER — Encounter: Payer: Self-pay | Admitting: Advanced Practice Midwife

## 2014-11-04 ENCOUNTER — Ambulatory Visit (INDEPENDENT_AMBULATORY_CARE_PROVIDER_SITE_OTHER): Payer: BLUE CROSS/BLUE SHIELD | Admitting: Advanced Practice Midwife

## 2014-11-04 VITALS — BP 90/50 | HR 80 | Wt 215.0 lb

## 2014-11-04 DIAGNOSIS — Z331 Pregnant state, incidental: Secondary | ICD-10-CM

## 2014-11-04 DIAGNOSIS — Z1389 Encounter for screening for other disorder: Secondary | ICD-10-CM

## 2014-11-04 DIAGNOSIS — Z3491 Encounter for supervision of normal pregnancy, unspecified, first trimester: Secondary | ICD-10-CM

## 2014-11-04 LAB — POCT URINALYSIS DIPSTICK
Glucose, UA: NEGATIVE
Glucose, UA: NEGATIVE
Ketones, UA: NEGATIVE
LEUKOCYTES UA: NEGATIVE
NITRITE UA: NEGATIVE
PROTEIN UA: NEGATIVE
PROTEIN UA: NEGATIVE
RBC UA: NEGATIVE

## 2014-11-04 NOTE — Progress Notes (Signed)
[id number] [redacted]w[redacted]d Estimated Date of Delivery: 05/19/15  Blood pressure 90/50, pulse 80, weight 215 lb (97.523 kg), last menstrual period 08/12/2014, unknown if currently breastfeeding.   BP weight and urine results all reviewed and noted.  Please refer to the obstetrical flow sheet for the fundal height and fetal heart rate documentation:  Patient denies any bleeding and no rupture of membranes symptoms or regular contractions. Patient is without complaints. Not taking meds, but feels much better anyway All questions were answered.  Orders Placed This Encounter  Procedures  . POCT Urinalysis Dipstick    Plan:  Continued routine obstetrical care,   Return in about 4 weeks (around 12/02/2014) for LROB.

## 2014-12-02 ENCOUNTER — Encounter: Payer: Self-pay | Admitting: Women's Health

## 2014-12-02 ENCOUNTER — Ambulatory Visit (INDEPENDENT_AMBULATORY_CARE_PROVIDER_SITE_OTHER): Payer: BLUE CROSS/BLUE SHIELD | Admitting: Women's Health

## 2014-12-02 VITALS — BP 104/70 | HR 76 | Wt 212.0 lb

## 2014-12-02 DIAGNOSIS — O2342 Unspecified infection of urinary tract in pregnancy, second trimester: Secondary | ICD-10-CM

## 2014-12-02 DIAGNOSIS — Z331 Pregnant state, incidental: Secondary | ICD-10-CM | POA: Diagnosis not present

## 2014-12-02 DIAGNOSIS — A599 Trichomoniasis, unspecified: Secondary | ICD-10-CM

## 2014-12-02 DIAGNOSIS — L292 Pruritus vulvae: Secondary | ICD-10-CM | POA: Diagnosis not present

## 2014-12-02 DIAGNOSIS — R8290 Unspecified abnormal findings in urine: Secondary | ICD-10-CM

## 2014-12-02 DIAGNOSIS — Z363 Encounter for antenatal screening for malformations: Secondary | ICD-10-CM

## 2014-12-02 DIAGNOSIS — Z3492 Encounter for supervision of normal pregnancy, unspecified, second trimester: Secondary | ICD-10-CM

## 2014-12-02 DIAGNOSIS — Z1389 Encounter for screening for other disorder: Secondary | ICD-10-CM

## 2014-12-02 LAB — POCT URINALYSIS DIPSTICK
Blood, UA: NEGATIVE
Glucose, UA: NEGATIVE
LEUKOCYTES UA: NEGATIVE
NITRITE UA: POSITIVE
Protein, UA: NEGATIVE

## 2014-12-02 LAB — POCT WET PREP (WET MOUNT)
CLUE CELLS WET PREP WHIFF POC: NEGATIVE
Trichomonas Wet Prep HPF POC: POSITIVE

## 2014-12-02 MED ORDER — NITROFURANTOIN MONOHYD MACRO 100 MG PO CAPS: 100.0000 mg | ORAL_CAPSULE | Freq: Two times a day (BID) | ORAL | Status: DC

## 2014-12-02 MED ORDER — METRONIDAZOLE 500 MG PO TABS: 2000.0000 mg | ORAL_TABLET | Freq: Once | ORAL | Status: DC

## 2014-12-02 NOTE — Patient Instructions (Signed)

## 2014-12-02 NOTE — Progress Notes (Signed)
Low-risk OB appointment [id number] 4860w0d Estimated Date of Delivery: 05/19/15 BP 104/70 mmHg  Pulse 76  Wt 212 lb (96.163 kg)  LMP 08/12/2014 (Exact Date)  BP, weight, and urine reviewed.  Refer to obstetrical flow sheet for FH & FHR.  No fm yet. Denies cramping, lof, vb, or uti s/s. Vulvar itching, husband says he is irritated day after sex, no abnormal d/c or odor. They did split up at beginning of year and she was w/ someone else.  Spec exam: cx visually closed, mod amt thin white frothy d/c, wet prep +trich although not motile at time, rx metronidazole 2gm po x 1 for pt & husband Julie Cummings (dob 04/17/85, no allergies)- no sex x 7d after both have taken, poc next visit. Pt doesn't want him to know what it is.  +nitrates urine, rx macrobid- to eat yogurt since will be on 2 antibiotics, send urine cx today Reviewed warning s/s to report. Plan:  Continue routine obstetrical care  F/U in 4wks for OB appointment, anatomy u/s, and trich poc

## 2014-12-04 LAB — URINE CULTURE: Organism ID, Bacteria: NO GROWTH

## 2014-12-28 ENCOUNTER — Telehealth: Payer: Self-pay | Admitting: Obstetrics & Gynecology

## 2014-12-28 ENCOUNTER — Telehealth: Payer: Self-pay | Admitting: *Deleted

## 2014-12-28 MED ORDER — OSELTAMIVIR PHOSPHATE 75 MG PO CAPS: 75.0000 mg | ORAL_CAPSULE | Freq: Every day | ORAL | Status: DC

## 2014-12-28 MED ORDER — ONDANSETRON 4 MG PO TBDP: 4.0000 mg | ORAL_TABLET | Freq: Three times a day (TID) | ORAL | Status: DC | PRN

## 2014-12-28 NOTE — Telephone Encounter (Signed)
Pt c/o vomiting and diarrhea, headaches, chills, slight fever, face flushed x 1 day.

## 2014-12-28 NOTE — Telephone Encounter (Signed)
Pt states that she is having vomiting and diarrhea at the same time. Pt states that she is achy and her body hurts. Pt states that she is having chills but feels warm to the touch. Pt states that the symptoms started this morning around 3 oclock.  I spoke with Dr. Emelda FearFerguson and he gave a verbal order for tamiflu 75 mg 1 daily for 5 days and for zofran ODT 4mg  1 every 8 hours as needed for nausea and vomiting. Pt advised to try to stay hydrated and drink plenty of fluids, and to start the medication today and to call us back if anything changes. Pt verblaized understanding.

## 2014-12-31 ENCOUNTER — Ambulatory Visit (INDEPENDENT_AMBULATORY_CARE_PROVIDER_SITE_OTHER): Payer: BLUE CROSS/BLUE SHIELD | Admitting: Obstetrics & Gynecology

## 2014-12-31 ENCOUNTER — Encounter: Payer: Self-pay | Admitting: Obstetrics & Gynecology

## 2014-12-31 ENCOUNTER — Ambulatory Visit (INDEPENDENT_AMBULATORY_CARE_PROVIDER_SITE_OTHER): Payer: BLUE CROSS/BLUE SHIELD

## 2014-12-31 VITALS — BP 120/80 | HR 80 | Wt 208.0 lb

## 2014-12-31 DIAGNOSIS — Z3A20 20 weeks gestation of pregnancy: Secondary | ICD-10-CM

## 2014-12-31 DIAGNOSIS — O321XX1 Maternal care for breech presentation, fetus 1: Secondary | ICD-10-CM | POA: Diagnosis not present

## 2014-12-31 DIAGNOSIS — Z363 Encounter for antenatal screening for malformations: Secondary | ICD-10-CM

## 2014-12-31 DIAGNOSIS — Z36 Encounter for antenatal screening of mother: Secondary | ICD-10-CM | POA: Diagnosis not present

## 2014-12-31 DIAGNOSIS — Z3492 Encounter for supervision of normal pregnancy, unspecified, second trimester: Secondary | ICD-10-CM

## 2014-12-31 DIAGNOSIS — Z1389 Encounter for screening for other disorder: Secondary | ICD-10-CM

## 2014-12-31 DIAGNOSIS — Z331 Pregnant state, incidental: Secondary | ICD-10-CM

## 2014-12-31 LAB — POCT URINALYSIS DIPSTICK
Blood, UA: NEGATIVE
Glucose, UA: NEGATIVE
NITRITE UA: NEGATIVE
PROTEIN UA: NEGATIVE

## 2014-12-31 NOTE — Progress Notes (Signed)
[id number] 7985w1d Estimated Date of Delivery: 05/19/15  Blood pressure 120/80, pulse 80, weight 208 lb (94.348 kg), last menstrual period 08/12/2014, unknown if currently breastfeeding.   BP weight and urine results all reviewed and noted.  Please refer to the obstetrical flow sheet for the fundal height and fetal heart rate documentation:  Patient reports good fetal movement, denies any bleeding and no rupture of membranes symptoms or regular contractions. Patient is without complaints. All questions were answered.  Orders Placed This Encounter  Procedures  . POCT urinalysis dipstick    Plan:  Continued routine obstetrical care, sonogram is normal, facial profile suboptimal, repeat at 28 weeks  Return in about 4 weeks (around 01/28/2015) for LROB.

## 2014-12-31 NOTE — Progress Notes (Signed)
US 20+1wks,measurements c/w dates,breech,cx 3.9cm,post pl gr 0,svp of fluid 5.2cm,bilat adnexa's wnl,EFW 377g,unable to see profile because of fetal pos,no obvious abn seen

## 2015-01-28 ENCOUNTER — Encounter: Payer: BLUE CROSS/BLUE SHIELD | Admitting: Advanced Practice Midwife

## 2015-01-31 NOTE — L&D Delivery Note (Signed)
Delivery Note Labor progressed normal. AROM occurred at 0000 with clear fluid. At 1:06 AM a viable female was delivered via  (Presentation: Vertex; occiput anterior ).  APGAR: 9, 9; weight pending.   Placenta status: intact, spontaneous.  Cord: 3 vessel with the following complications: .  Cord pH: n/a  Anesthesia:  Epidural Episiotomy:  None Lacerations:  Small bilateral hemostatic periurethral abrasions Suture Repair: n/a Est. Blood Loss (mL):  50ml  Mom to postpartum.  Baby to Couplet care / Skin to Skin.  Jinny BlossomKaty D Mayo 05/19/2015, 1:27 AM   OB fellow attestation: Patient is a [id number] at 4636w0d who was admitted active labor and progressed well. I was gloved and present for delivery in its entirety.  Second stage of labor progressed, baby delivered after 1 contractions.    Federico FlakeKimberly Niles Maricus Tanzi, MD 2:07 AM

## 2015-02-03 ENCOUNTER — Ambulatory Visit (INDEPENDENT_AMBULATORY_CARE_PROVIDER_SITE_OTHER): Payer: BLUE CROSS/BLUE SHIELD | Admitting: Advanced Practice Midwife

## 2015-02-03 ENCOUNTER — Encounter: Payer: Self-pay | Admitting: Advanced Practice Midwife

## 2015-02-03 ENCOUNTER — Encounter: Payer: BLUE CROSS/BLUE SHIELD | Admitting: Advanced Practice Midwife

## 2015-02-03 VITALS — BP 110/70 | HR 92 | Wt 209.0 lb

## 2015-02-03 DIAGNOSIS — A5901 Trichomonal vulvovaginitis: Secondary | ICD-10-CM

## 2015-02-03 DIAGNOSIS — Z8759 Personal history of other complications of pregnancy, childbirth and the puerperium: Secondary | ICD-10-CM

## 2015-02-03 DIAGNOSIS — Z1389 Encounter for screening for other disorder: Secondary | ICD-10-CM

## 2015-02-03 DIAGNOSIS — Z3492 Encounter for supervision of normal pregnancy, unspecified, second trimester: Secondary | ICD-10-CM

## 2015-02-03 DIAGNOSIS — Z363 Encounter for antenatal screening for malformations: Secondary | ICD-10-CM

## 2015-02-03 DIAGNOSIS — O98312 Other infections with a predominantly sexual mode of transmission complicating pregnancy, second trimester: Secondary | ICD-10-CM

## 2015-02-03 DIAGNOSIS — Z331 Pregnant state, incidental: Secondary | ICD-10-CM

## 2015-02-03 DIAGNOSIS — Z3482 Encounter for supervision of other normal pregnancy, second trimester: Secondary | ICD-10-CM

## 2015-02-03 DIAGNOSIS — Z3A25 25 weeks gestation of pregnancy: Secondary | ICD-10-CM

## 2015-02-03 LAB — POCT URINALYSIS DIPSTICK
GLUCOSE UA: NEGATIVE
Leukocytes, UA: NEGATIVE
Nitrite, UA: NEGATIVE
RBC UA: NEGATIVE

## 2015-02-03 MED ORDER — ALBUTEROL SULFATE HFA 108 (90 BASE) MCG/ACT IN AERS: 2.0000 | INHALATION_SPRAY | Freq: Four times a day (QID) | RESPIRATORY_TRACT | Status: DC | PRN

## 2015-02-03 NOTE — Progress Notes (Signed)
[id number] 4291w0d Estimated Date of Delivery: 05/19/15  Weight 209 lb (94.802 kg), last menstrual period 08/12/2014, unknown if currently breastfeeding.   BP weight and urine results all reviewed and noted.  Please refer to the obstetrical flow sheet for the fundal height and fetal heart rate documentation:  Patient reports good fetal movement, denies any bleeding and no rupture of membranes symptoms or regular contractions. Patient is without complaints. Other than normal pregnancy symptoms.  Has started walking, drinking a lot more water, eating more vegatables. Has had some URI sx (both boys have it too) for 4 days. Has some wheezing, uses inhaler rarely.  Will refill inhaler All questions were answered.  Orders Placed This Encounter  Procedures  . Trichomonas vaginalis, RNA  . US OB Follow Up    Plan:  Continued routine obstetrical care, flu shot today  Return in about 3 weeks (around 02/24/2015) for US:OB F/U:, PN2/LROB. US to recheck facial anatomy

## 2015-02-03 NOTE — Patient Instructions (Signed)

## 2015-02-05 LAB — TRICHOMONAS VAGINALIS, PROBE AMP: Trich vag by NAA: NEGATIVE

## 2015-02-25 ENCOUNTER — Other Ambulatory Visit: Payer: Self-pay | Admitting: Advanced Practice Midwife

## 2015-02-25 ENCOUNTER — Ambulatory Visit (INDEPENDENT_AMBULATORY_CARE_PROVIDER_SITE_OTHER): Payer: Medicaid Other | Admitting: Advanced Practice Midwife

## 2015-02-25 ENCOUNTER — Other Ambulatory Visit: Payer: Medicaid Other

## 2015-02-25 ENCOUNTER — Ambulatory Visit (INDEPENDENT_AMBULATORY_CARE_PROVIDER_SITE_OTHER): Payer: Medicaid Other

## 2015-02-25 VITALS — BP 116/74 | HR 98 | Wt 213.0 lb

## 2015-02-25 DIAGNOSIS — Z363 Encounter for antenatal screening for malformations: Secondary | ICD-10-CM

## 2015-02-25 DIAGNOSIS — Z3A28 28 weeks gestation of pregnancy: Secondary | ICD-10-CM

## 2015-02-25 DIAGNOSIS — Z36 Encounter for antenatal screening of mother: Secondary | ICD-10-CM

## 2015-02-25 DIAGNOSIS — Z369 Encounter for antenatal screening, unspecified: Secondary | ICD-10-CM

## 2015-02-25 DIAGNOSIS — Z131 Encounter for screening for diabetes mellitus: Secondary | ICD-10-CM

## 2015-02-25 DIAGNOSIS — Z0489 Encounter for examination and observation for other specified reasons: Secondary | ICD-10-CM

## 2015-02-25 DIAGNOSIS — Z331 Pregnant state, incidental: Secondary | ICD-10-CM

## 2015-02-25 DIAGNOSIS — Z3483 Encounter for supervision of other normal pregnancy, third trimester: Secondary | ICD-10-CM

## 2015-02-25 DIAGNOSIS — Z3492 Encounter for supervision of normal pregnancy, unspecified, second trimester: Secondary | ICD-10-CM

## 2015-02-25 DIAGNOSIS — IMO0002 Reserved for concepts with insufficient information to code with codable children: Secondary | ICD-10-CM

## 2015-02-25 DIAGNOSIS — Z3493 Encounter for supervision of normal pregnancy, unspecified, third trimester: Secondary | ICD-10-CM

## 2015-02-25 DIAGNOSIS — Z1389 Encounter for screening for other disorder: Secondary | ICD-10-CM

## 2015-02-25 DIAGNOSIS — A599 Trichomoniasis, unspecified: Secondary | ICD-10-CM

## 2015-02-25 LAB — POCT URINALYSIS DIPSTICK
Blood, UA: NEGATIVE
GLUCOSE UA: NEGATIVE
Ketones, UA: NEGATIVE
LEUKOCYTES UA: NEGATIVE
Nitrite, UA: NEGATIVE

## 2015-02-25 NOTE — Progress Notes (Signed)
[id number] [redacted]w[redacted]d Estimated Date of Delivery: 05/19/15  Last menstrual period 08/12/2014, unknown if currently breastfeeding.   BP weight and urine results all reviewed and noted.  Please refer to the obstetrical flow sheet for the fundal height and fetal heart rate documentation: [name] today (incomplete anatomy scan):  [name] 28+1wks,cephalic,cx 3.9cm,post pl, normal ov's bilat,svp of fluid 4.8cm,fhr 132 bpm,efw 1266g,60%,facial anatomy complete,no obvious abn seen  Patient reports good fetal movement, denies any bleeding and no rupture of membranes symptoms or regular contractions. Patient is without complaints. All questions were answered.  No orders of the defined types were placed in this encounter.    Plan:  Continued routine obstetrical care, PN2 today  Return in about 3 weeks (around 03/18/2015) for LROB.

## 2015-02-25 NOTE — Progress Notes (Signed)
[name] 28+1wks,cephalic,cx 3.9cm,post pl, normal ov's bilat,svp of fluid 4.8cm,fhr 132 bpm,efw 1266g,60%,facial anatomy complete,no obvious abn seen

## 2015-02-26 LAB — CBC
HEMATOCRIT: 38.9 % (ref 34.0–46.6)
HEMOGLOBIN: 13.1 g/dL (ref 11.1–15.9)
MCH: 30.7 pg (ref 26.6–33.0)
MCHC: 33.7 g/dL (ref 31.5–35.7)
MCV: 91 fL (ref 79–97)
Platelets: 245 10*3/uL (ref 150–379)
RBC: 4.27 x10E6/uL (ref 3.77–5.28)
RDW: 12.6 % (ref 12.3–15.4)
WBC: 9.7 10*3/uL (ref 3.4–10.8)

## 2015-02-26 LAB — ANTIBODY SCREEN: ANTIBODY SCREEN: NEGATIVE

## 2015-02-26 LAB — GLUCOSE TOLERANCE, 2 HOURS W/ 1HR
GLUCOSE, FASTING: 82 mg/dL (ref 65–91)
Glucose, 1 hour: 100 mg/dL (ref 65–179)
Glucose, 2 hour: 67 mg/dL (ref 65–152)

## 2015-02-26 LAB — RPR: RPR: NONREACTIVE

## 2015-02-26 LAB — HIV ANTIBODY (ROUTINE TESTING W REFLEX): HIV SCREEN 4TH GENERATION: NONREACTIVE

## 2015-03-18 ENCOUNTER — Ambulatory Visit (INDEPENDENT_AMBULATORY_CARE_PROVIDER_SITE_OTHER): Payer: Medicaid Other | Admitting: Advanced Practice Midwife

## 2015-03-18 VITALS — BP 98/60 | HR 84 | Wt 216.0 lb

## 2015-03-18 DIAGNOSIS — N898 Other specified noninflammatory disorders of vagina: Secondary | ICD-10-CM

## 2015-03-18 DIAGNOSIS — Z331 Pregnant state, incidental: Secondary | ICD-10-CM | POA: Diagnosis not present

## 2015-03-18 DIAGNOSIS — Z1389 Encounter for screening for other disorder: Secondary | ICD-10-CM

## 2015-03-18 DIAGNOSIS — Z3493 Encounter for supervision of normal pregnancy, unspecified, third trimester: Secondary | ICD-10-CM

## 2015-03-18 LAB — POCT URINALYSIS DIPSTICK
Glucose, UA: NEGATIVE
Ketones, UA: NEGATIVE
Leukocytes, UA: NEGATIVE
Nitrite, UA: NEGATIVE
RBC UA: NEGATIVE

## 2015-03-18 NOTE — Addendum Note (Signed)
Addended by: Colen Darling on: 03/18/2015 03:14 PM   Modules accepted: Orders

## 2015-03-18 NOTE — Progress Notes (Signed)
[id number] [redacted]w[redacted]d Estimated Date of Delivery: 05/19/15  Blood pressure 98/60, pulse 84, weight 216 lb (97.977 kg), last menstrual period 08/12/2014, unknown if currently breastfeeding.   BP weight and urine results all reviewed and noted.  Please refer to the obstetrical flow sheet for the fundal height and fetal heart rate documentation:  Patient reports good fetal movement, denies any bleeding and no rupture of membranes symptoms or regular contractions. Patient c/o yellow vaginal discharge after having sex.  No itch/irritation. SSE:  Scant mucusy white dc, no odor.  wet prep TNTC WBC, no trich, yeast or clue All questions were answered.  Orders Placed This Encounter  Procedures  . POCT urinalysis dipstick    Plan:  Continued routine obstetrical care, check GC/CHL  Return in about 2 weeks (around 04/01/2015) for LROB.

## 2015-04-01 ENCOUNTER — Ambulatory Visit (INDEPENDENT_AMBULATORY_CARE_PROVIDER_SITE_OTHER): Payer: Medicaid Other | Admitting: Advanced Practice Midwife

## 2015-04-01 VITALS — BP 108/68 | HR 80 | Wt 217.0 lb

## 2015-04-01 DIAGNOSIS — Z1389 Encounter for screening for other disorder: Secondary | ICD-10-CM

## 2015-04-01 DIAGNOSIS — Z3483 Encounter for supervision of other normal pregnancy, third trimester: Secondary | ICD-10-CM

## 2015-04-01 DIAGNOSIS — Z3493 Encounter for supervision of normal pregnancy, unspecified, third trimester: Secondary | ICD-10-CM

## 2015-04-01 DIAGNOSIS — Z331 Pregnant state, incidental: Secondary | ICD-10-CM

## 2015-04-01 DIAGNOSIS — Z3A33 33 weeks gestation of pregnancy: Secondary | ICD-10-CM

## 2015-04-01 LAB — POCT URINALYSIS DIPSTICK
Blood, UA: NEGATIVE
Glucose, UA: NEGATIVE
Ketones, UA: NEGATIVE
NITRITE UA: NEGATIVE
PROTEIN UA: NEGATIVE

## 2015-04-01 MED ORDER — OMEPRAZOLE 20 MG PO CPDR
20.0000 mg | DELAYED_RELEASE_CAPSULE | Freq: Every day | ORAL | Status: DC
Start: 2015-04-01 — End: 2015-05-18

## 2015-04-01 NOTE — Progress Notes (Signed)
[id number] [redacted]w[redacted]d Estimated Date of Delivery: 05/19/15  Last menstrual period 08/12/2014, unknown if currently breastfeeding.   BP weight and urine results all reviewed and noted.  Please refer to the obstetrical flow sheet for the fundal height and fetal heart rate documentation  Patient reports good fetal movement, denies any bleeding and no rupture of membranes symptoms or regular contractions. Patient is without complaints other than normal pregnancy complaints. All questions were answered.  Orders Placed This Encounter  Procedures  . POCT urinalysis dipstick    Plan:  Continued routine obstetrical care  Return in about 2 weeks (around 04/15/2015) for LROB.

## 2015-04-03 LAB — GC/CHLAMYDIA PROBE AMP
CHLAMYDIA, DNA PROBE: NEGATIVE
NEISSERIA GONORRHOEAE BY PCR: NEGATIVE

## 2015-04-15 ENCOUNTER — Encounter: Payer: Medicaid Other | Admitting: Advanced Practice Midwife

## 2015-04-20 ENCOUNTER — Encounter: Payer: Self-pay | Admitting: Advanced Practice Midwife

## 2015-04-20 ENCOUNTER — Ambulatory Visit (INDEPENDENT_AMBULATORY_CARE_PROVIDER_SITE_OTHER): Payer: Medicaid Other | Admitting: Advanced Practice Midwife

## 2015-04-20 VITALS — BP 100/60 | HR 93 | Wt 218.0 lb

## 2015-04-20 DIAGNOSIS — Z1389 Encounter for screening for other disorder: Secondary | ICD-10-CM

## 2015-04-20 DIAGNOSIS — Z3493 Encounter for supervision of normal pregnancy, unspecified, third trimester: Secondary | ICD-10-CM

## 2015-04-20 DIAGNOSIS — Z331 Pregnant state, incidental: Secondary | ICD-10-CM

## 2015-04-20 LAB — POCT URINALYSIS DIPSTICK
Blood, UA: NEGATIVE
Glucose, UA: NEGATIVE
KETONES UA: NEGATIVE
Nitrite, UA: NEGATIVE
Protein, UA: NEGATIVE

## 2015-04-20 NOTE — Progress Notes (Signed)
[id number] 5533w6d Estimated Date of Delivery: 05/19/15  Blood pressure 100/60, pulse 93, weight 218 lb (98.884 kg), last menstrual period 08/12/2014, unknown if currently breastfeeding.   BP weight and urine results all reviewed and noted.  Please refer to the obstetrical flow sheet for the fundal height and fetal heart rate documentation:  Patient reports good fetal movement, denies any bleeding and no rupture of membranes symptoms or regular contractions. Patient is without complaints. All questions were answered.  Orders Placed This Encounter  Procedures  . POCT urinalysis dipstick    Plan:  Continued routine obstetrical care,   Return in about 1 week (around 04/27/2015) for LROB.

## 2015-04-20 NOTE — Progress Notes (Signed)
Pt states that she had pretty intense contractions last night.

## 2015-04-27 ENCOUNTER — Encounter: Payer: Self-pay | Admitting: Women's Health

## 2015-04-27 ENCOUNTER — Ambulatory Visit (INDEPENDENT_AMBULATORY_CARE_PROVIDER_SITE_OTHER): Payer: Medicaid Other | Admitting: Women's Health

## 2015-04-27 VITALS — BP 122/70 | HR 84 | Wt 222.0 lb

## 2015-04-27 DIAGNOSIS — Z3A37 37 weeks gestation of pregnancy: Secondary | ICD-10-CM

## 2015-04-27 DIAGNOSIS — Z1389 Encounter for screening for other disorder: Secondary | ICD-10-CM

## 2015-04-27 DIAGNOSIS — Z331 Pregnant state, incidental: Secondary | ICD-10-CM

## 2015-04-27 DIAGNOSIS — Z3685 Encounter for antenatal screening for Streptococcus B: Secondary | ICD-10-CM

## 2015-04-27 DIAGNOSIS — Z6791 Unspecified blood type, Rh negative: Secondary | ICD-10-CM

## 2015-04-27 DIAGNOSIS — Z118 Encounter for screening for other infectious and parasitic diseases: Secondary | ICD-10-CM

## 2015-04-27 DIAGNOSIS — Z3493 Encounter for supervision of normal pregnancy, unspecified, third trimester: Secondary | ICD-10-CM

## 2015-04-27 DIAGNOSIS — O360130 Maternal care for anti-D [Rh] antibodies, third trimester, not applicable or unspecified: Secondary | ICD-10-CM | POA: Diagnosis not present

## 2015-04-27 DIAGNOSIS — Z1159 Encounter for screening for other viral diseases: Secondary | ICD-10-CM

## 2015-04-27 LAB — POCT URINALYSIS DIPSTICK
GLUCOSE UA: NEGATIVE
Ketones, UA: NEGATIVE
NITRITE UA: NEGATIVE
Protein, UA: NEGATIVE
RBC UA: NEGATIVE

## 2015-04-27 MED ORDER — RHO D IMMUNE GLOBULIN 1500 UNIT/2ML IJ SOSY
300.0000 ug | PREFILLED_SYRINGE | Freq: Once | INTRAMUSCULAR | Status: AC
  Administered 2015-04-27: 300 ug via INTRAMUSCULAR

## 2015-04-27 NOTE — Patient Instructions (Signed)
Call the office ([phone]) or go to [facility] if:  You begin to have strong, frequent contractions  Your water breaks.  Sometimes it is a big gush of fluid, sometimes it is just a trickle that keeps getting your panties wet or running down your legs  You have vaginal bleeding.  It is normal to have a small amount of spotting if your cervix was checked.   You don't feel your baby moving like normal.  If you don't, get you something to eat and drink and lay down and focus on feeling your baby move.  You should feel at least 10 movements in 2 hours.  If you don't, you should call the office or go to [facility].    Braxton Hicks Contractions Contractions of the uterus can occur throughout pregnancy. Contractions are not always a sign that you are in labor.  WHAT ARE BRAXTON HICKS CONTRACTIONS?  Contractions that occur before labor are called Braxton Hicks contractions, or false labor. Toward the end of pregnancy (32-34 weeks), these contractions can develop more often and may become more forceful. This is not true labor because these contractions do not result in opening (dilatation) and thinning of the cervix. They are sometimes difficult to tell apart from true labor because these contractions can be forceful and people have different pain tolerances. You should not feel embarrassed if you go to the hospital with false labor. Sometimes, the only way to tell if you are in true labor is for your health care provider to look for changes in the cervix. If there are no prenatal problems or other health problems associated with the pregnancy, it is completely safe to be sent home with false labor and await the onset of true labor. HOW CAN YOU TELL THE DIFFERENCE BETWEEN TRUE AND FALSE LABOR? False Labor  The contractions of false labor are usually shorter and not as hard as those of true labor.   The contractions are usually irregular.   The contractions are often felt in the front of  the lower abdomen and in the groin.   The contractions may go away when you walk around or change positions while lying down.   The contractions get weaker and are shorter lasting as time goes on.   The contractions do not usually become progressively stronger, regular, and closer together as with true labor.  True Labor  Contractions in true labor last 30-70 seconds, become very regular, usually become more intense, and increase in frequency.   The contractions do not go away with walking.   The discomfort is usually felt in the top of the uterus and spreads to the lower abdomen and low back.   True labor can be determined by your health care provider with an exam. This will show that the cervix is dilating and getting thinner.  WHAT TO REMEMBER  Keep up with your usual exercises and follow other instructions given by your health care provider.   Take medicines as directed by your health care provider.   Keep your regular prenatal appointments.   Eat and drink lightly if you think you are going into labor.   If Braxton Hicks contractions are making you uncomfortable:   Change your position from lying down or resting to walking, or from walking to resting.   Sit and rest in a tub of warm water.   Drink 2-3 glasses of water. Dehydration may cause these contractions.   Do slow and deep breathing several times an hour.    WHEN SHOULD I SEEK IMMEDIATE MEDICAL CARE? Seek immediate medical care if:  Your contractions become stronger, more regular, and closer together.   You have fluid leaking or gushing from your vagina.   You have a fever.   You pass blood-tinged mucus.   You have vaginal bleeding.   You have continuous abdominal pain.   You have low back pain that you never had before.   You feel your baby's head pushing down and causing pelvic pressure.   Your baby is not moving as much as it used to.    This information is not intended to  replace advice given to you by your health care provider. Make sure you discuss any questions you have with your health care provider.   Document Released: 01/16/2005 Document Revised: 01/21/2013 Document Reviewed: 10/28/2012 Elsevier Interactive Patient Education 2016 [employer]  

## 2015-04-27 NOTE — Progress Notes (Signed)
Low-risk OB appointment [id number] 4256w6d Estimated Date of Delivery: 05/19/15 BP 122/70 mmHg  Pulse 84  Wt 222 lb (100.699 kg)  LMP 08/12/2014 (Exact Date)  BP, weight, and urine reviewed.  Refer to obstetrical flow sheet for FH & FHR.  Reports good fm.  Denies regular uc's, lof, vb, or uti s/s. Lt hip pain- reviewed relief measures.  GBS collected SVE per request: 1/th/-2, vtx, anterior Reviewed labor s/s, fkc. Plan:  Continue routine obstetrical care  F/U in 1wk for OB appointment  Rhogam and GBS today Declines flu shot

## 2015-04-29 LAB — STREP GP B NAA: STREP GROUP B AG: NEGATIVE

## 2015-04-29 LAB — GC/CHLAMYDIA PROBE AMP
CHLAMYDIA, DNA PROBE: NEGATIVE
NEISSERIA GONORRHOEAE BY PCR: NEGATIVE

## 2015-05-04 ENCOUNTER — Ambulatory Visit (INDEPENDENT_AMBULATORY_CARE_PROVIDER_SITE_OTHER): Payer: Medicaid Other | Admitting: Advanced Practice Midwife

## 2015-05-04 ENCOUNTER — Encounter: Payer: Self-pay | Admitting: Advanced Practice Midwife

## 2015-05-04 VITALS — BP 100/60 | HR 77 | Wt 223.0 lb

## 2015-05-04 DIAGNOSIS — Z1389 Encounter for screening for other disorder: Secondary | ICD-10-CM

## 2015-05-04 DIAGNOSIS — Z3493 Encounter for supervision of normal pregnancy, unspecified, third trimester: Secondary | ICD-10-CM

## 2015-05-04 DIAGNOSIS — Z331 Pregnant state, incidental: Secondary | ICD-10-CM

## 2015-05-04 LAB — POCT URINALYSIS DIPSTICK
Blood, UA: NEGATIVE
GLUCOSE UA: NEGATIVE
KETONES UA: NEGATIVE
LEUKOCYTES UA: NEGATIVE
NITRITE UA: NEGATIVE
PROTEIN UA: NEGATIVE

## 2015-05-04 NOTE — Progress Notes (Signed)
Pt denies any problems or concerns at this time.  

## 2015-05-04 NOTE — Progress Notes (Signed)
[id number] 2569w6d Estimated Date of Delivery: 05/19/15  Blood pressure 100/60, pulse 77, weight 223 lb (101.152 kg), last menstrual period 08/12/2014, unknown if currently breastfeeding.   BP weight and urine results all reviewed and noted.  Please refer to the obstetrical flow sheet for the fundal height and fetal heart rate documentation:  Patient reports good fetal movement, denies any bleeding and no rupture of membranes symptoms or regular contractions. Patient  All questions were answered.  Orders Placed This Encounter  Procedures  . POCT urinalysis dipstick    Plan:  Continued routine obstetrical care,   Return in about 1 week (around 05/11/2015) for LROB.

## 2015-05-11 ENCOUNTER — Ambulatory Visit (INDEPENDENT_AMBULATORY_CARE_PROVIDER_SITE_OTHER): Payer: Medicaid Other | Admitting: Women's Health

## 2015-05-11 VITALS — BP 114/70 | HR 82 | Wt 224.0 lb

## 2015-05-11 DIAGNOSIS — Z331 Pregnant state, incidental: Secondary | ICD-10-CM

## 2015-05-11 DIAGNOSIS — N898 Other specified noninflammatory disorders of vagina: Secondary | ICD-10-CM | POA: Diagnosis not present

## 2015-05-11 DIAGNOSIS — B373 Candidiasis of vulva and vagina: Secondary | ICD-10-CM

## 2015-05-11 DIAGNOSIS — Z3483 Encounter for supervision of other normal pregnancy, third trimester: Secondary | ICD-10-CM

## 2015-05-11 DIAGNOSIS — O368130 Decreased fetal movements, third trimester, not applicable or unspecified: Secondary | ICD-10-CM

## 2015-05-11 DIAGNOSIS — O26893 Other specified pregnancy related conditions, third trimester: Secondary | ICD-10-CM

## 2015-05-11 DIAGNOSIS — Z3A39 39 weeks gestation of pregnancy: Secondary | ICD-10-CM

## 2015-05-11 DIAGNOSIS — Z1389 Encounter for screening for other disorder: Secondary | ICD-10-CM

## 2015-05-11 DIAGNOSIS — B3731 Acute candidiasis of vulva and vagina: Secondary | ICD-10-CM

## 2015-05-11 DIAGNOSIS — Z3493 Encounter for supervision of normal pregnancy, unspecified, third trimester: Secondary | ICD-10-CM

## 2015-05-11 LAB — POCT WET PREP (WET MOUNT): Clue Cells Wet Prep Whiff POC: NEGATIVE

## 2015-05-11 LAB — POCT URINALYSIS DIPSTICK
GLUCOSE UA: NEGATIVE
Ketones, UA: NEGATIVE
LEUKOCYTES UA: NEGATIVE
NITRITE UA: NEGATIVE
RBC UA: NEGATIVE

## 2015-05-11 NOTE — Progress Notes (Signed)
Low-risk OB appointment [id number] 9029w6d Estimated Date of Delivery: 05/19/15 BP 114/70 mmHg  Pulse 82  Wt 224 lb (101.606 kg)  LMP 08/12/2014 (Exact Date)  BP, weight, and urine reviewed.  Refer to obstetrical flow sheet for FH & FHR.  Reports decreased fm for last few days, not getting 6210mvmt/2hr when counting. Advised any time this happens- call us immediately or go straight to [facility]. Denies regular uc's, lof, vb, or uti s/s. Leaking watery fluid for few days- doesn't think it's her water that has broken, maybe urinating on self. Denies itching/irritation/odor. No big gushes or leaking down legs.  SSE: cx visually closed, no pooling, no change w/ valsalva. Thick clumpy ligh yellowish nonodorous d/c, wet prep mod yeast. Nitrazine/fern neg. Use otc monistat 7 for yeast.  SVE per request: 1.5/50/-2, vtx NST for decreased fm: Reactive/Cat I- pt states she felt a lot of fm during nst Reviewed labor s/s, fkc- importance of not waiting when fetal kick counts <10/2hr. Plan:  Continue routine obstetrical care  F/U in 1wk for OB appointment

## 2015-05-11 NOTE — Patient Instructions (Signed)
Monistat 7 for yeast  Call the office [phone]) or go to [facility] if:  You begin to have strong, frequent contractions  Your water breaks.  Sometimes it is a big gush of fluid, sometimes it is just a trickle that keeps getting your panties wet or running down your legs  You have vaginal bleeding.  It is normal to have a small amount of spotting if your cervix was checked.   You don't feel your baby moving like normal.  If you don't, get you something to eat and drink and lay down and focus on feeling your baby move.  You should feel at least 10 movements in 2 hours.  If you don't, you should call the office or go to [facility].    [facility] Contractions of the uterus can occur throughout pregnancy. Contractions are not always a sign that you are in labor.  WHAT ARE BRAXTON HICKS CONTRACTIONS?  Contractions that occur before labor are called Braxton Hicks contractions, or false labor. Toward the end of pregnancy (32-34 weeks), these contractions can develop more often and may become more forceful. This is not true labor because these contractions do not result in opening (dilatation) and thinning of the cervix. They are sometimes difficult to tell apart from true labor because these contractions can be forceful and people have different pain tolerances. You should not feel embarrassed if you go to the hospital with false labor. Sometimes, the only way to tell if you are in true labor is for your health care provider to look for changes in the cervix. If there are no prenatal problems or other health problems associated with the pregnancy, it is completely safe to be sent home with false labor and await the onset of true labor. HOW CAN YOU TELL THE DIFFERENCE BETWEEN TRUE AND FALSE LABOR? False Labor  The contractions of false labor are usually shorter and not as hard as those of true labor.   The contractions are usually irregular.   The contractions are often  felt in the front of the lower abdomen and in the groin.   The contractions may go away when you walk around or change positions while lying down.   The contractions get weaker and are shorter lasting as time goes on.   The contractions do not usually become progressively stronger, regular, and closer together as with true labor.  True Labor 1. Contractions in true labor last 30-70 seconds, become very regular, usually become more intense, and increase in frequency.  2. The contractions do not go away with walking.  3. The discomfort is usually felt in the top of the uterus and spreads to the lower abdomen and low back.  4. True labor can be determined by your health care provider with an exam. This will show that the cervix is dilating and getting thinner.  WHAT TO REMEMBER  Keep up with your usual exercises and follow other instructions given by your health care provider.   Take medicines as directed by your health care provider.   Keep your regular prenatal appointments.   Eat and drink lightly if you think you are going into labor.   If Braxton Hicks contractions are making you uncomfortable:   Change your position from lying down or resting to walking, or from walking to resting.   Sit and rest in a tub of warm water.   Drink 2-3 glasses of water. Dehydration may cause these contractions.   Do slow and deep  breathing several times an hour.  WHEN SHOULD I SEEK IMMEDIATE MEDICAL CARE? Seek immediate medical care if:  Your contractions become stronger, more regular, and closer together.   You have fluid leaking or gushing from your vagina.   You have a fever.   You pass blood-tinged mucus.   You have vaginal bleeding.   You have continuous abdominal pain.   You have low back pain that you never had before.   You feel your baby's head pushing down and causing pelvic pressure.   Your baby is not moving as much as it used to.    This  information is not intended to replace advice given to you by your health care provider. Make sure you discuss any questions you have with your health care provider.   Document Released: 01/16/2005 Document Revised: 01/21/2013 Document Reviewed: 10/28/2012 Elsevier Interactive Patient Education 2016 Else[employer] Inc[employer]  Fetal Movement Counts Patient Name: __________________________________________________ Patient Due Date: ____________________ Performing a fetal movement count is highly recommended in high-risk pregnancies, but it is good for every pregnant woman to do. Your health care provider may ask you to start counting fetal movements at 28 weeks of the pregnancy. Fetal movements often increase:  After eating a full meal.  After physical activity.  After eating or drinking something sweet or cold.  At rest. Pay attention to when you feel the baby is most active. This will help you notice a pattern of your baby's sleep and wake cycles and what factors contribute to an increase in fetal movement. It is important to perform a fetal movement count at the same time each day when your baby is normally most active.  HOW TO COUNT FETAL MOVEMENTS 5. Find a quiet and comfortable area to sit or lie down on your left side. Lying on your left side provides the best blood and oxygen circulation to your baby. 6. Write down the day and time on a sheet of paper or in a journal. 7. Start counting kicks, flutters, swishes, rolls, or jabs in a 2-hour period. You should feel at least 10 movements within 2 hours. 8. If you do not feel 10 movements in 2 hours, wait 2-3 hours and count again. Look for a change in the pattern or not enough counts in 2 hours. SEEK MEDICAL CARE IF:  You feel less than 10 counts in 2 hours, tried twice.  There is no movement in over an hour.  The pattern is changing or taking longer each day to reach 10 counts in 2 hours.  You feel the baby is not moving as he or she usually  does. Date: ____________ Movements: ____________ Start time: ____________ Julie Cummings time: ____________  Date: ____________ Movements: ____________ Start time: ____________ Julie Cummings time: ____________ Date: ____________ Movements: ____________ Start time: ____________ Julie Cummings time: ____________ Date: ____________ Movements: ____________ Start time: ____________ Julie Cummings time: ____________ Date: ____________ Movements: ____________ Start time: ____________ Julie Cummings time: ____________ Date: ____________ Movements: ____________ Start time: ____________ Julie Cummings time: ____________ Date: ____________ Movements: ____________ Start time: ____________ Julie Cummings time: ____________ Date: ____________ Movements: ____________ Start time: ____________ Julie Cummings time: ____________  Date: ____________ Movements: ____________ Start time: ____________ Julie Cummings time: ____________ Date: ____________ Movements: ____________ Start time: ____________ Julie Cummings time: ____________ Date: ____________ Movements: ____________ Start time: ____________ Julie Cummings time: ____________ Date: ____________ Movements: ____________ Start time: ____________ Julie Cummings time: ____________ Date: ____________ Movements: ____________ Start time: ____________ Julie Cummings time: ____________ Date: ____________ Movements: ____________ Start time: ____________ Julie Cummings time: ____________ Date: ____________ Movements: ____________ Start time: ____________ Julie Cummings  time: ____________  Date: ____________ Movements: ____________ Start time: ____________ Julie Cummings time: ____________ Date: ____________ Movements: ____________ Start time: ____________ Julie Cummings time: ____________ Date: ____________ Movements: ____________ Start time: ____________ Julie Cummings time: ____________ Date: ____________ Movements: ____________ Start time: ____________ Julie Cummings time: ____________ Date: ____________ Movements: ____________ Start time: ____________ Julie Cummings time: ____________ Date: ____________ Movements:  ____________ Start time: ____________ Julie Cummings time: ____________ Date: ____________ Movements: ____________ Start time: ____________ Julie Cummings time: ____________  Date: ____________ Movements: ____________ Start time: ____________ Julie Cummings time: ____________ Date: ____________ Movements: ____________ Start time: ____________ Julie Cummings time: ____________ Date: ____________ Movements: ____________ Start time: ____________ Julie Cummings time: ____________ Date: ____________ Movements: ____________ Start time: ____________ Julie Cummings time: ____________ Date: ____________ Movements: ____________ Start time: ____________ Julie Cummings time: ____________ Date: ____________ Movements: ____________ Start time: ____________ Julie Cummings time: ____________ Date: ____________ Movements: ____________ Start time: ____________ Julie Cummings time: ____________  Date: ____________ Movements: ____________ Start time: ____________ Julie Cummings time: ____________ Date: ____________ Movements: ____________ Start time: ____________ Julie Cummings time: ____________ Date: ____________ Movements: ____________ Start time: ____________ Julie Cummings time: ____________ Date: ____________ Movements: ____________ Start time: ____________ Julie Cummings time: ____________ Date: ____________ Movements: ____________ Start time: ____________ Julie Cummings time: ____________ Date: ____________ Movements: ____________ Start time: ____________ Julie Cummings time: ____________ Date: ____________ Movements: ____________ Start time: ____________ Julie Cummings time: ____________  Date: ____________ Movements: ____________ Start time: ____________ Julie Cummings time: ____________ Date: ____________ Movements: ____________ Start time: ____________ Julie Cummings time: ____________ Date: ____________ Movements: ____________ Start time: ____________ Julie Cummings time: ____________ Date: ____________ Movements: ____________ Start time: ____________ Julie Cummings time: ____________ Date: ____________ Movements: ____________ Start time: ____________ Julie Cummings  time: ____________ Date: ____________ Movements: ____________ Start time: ____________ Julie Cummings time: ____________ Date: ____________ Movements: ____________ Start time: ____________ Julie Cummings time: ____________  Date: ____________ Movements: ____________ Start time: ____________ Julie Cummings time: ____________ Date: ____________ Movements: ____________ Start time: ____________ Julie Cummings time: ____________ Date: ____________ Movements: ____________ Start time: ____________ Julie Cummings time: ____________ Date: ____________ Movements: ____________ Start time: ____________ Julie Cummings time: ____________ Date: ____________ Movements: ____________ Start time: ____________ Julie Cummings time: ____________ Date: ____________ Movements: ____________ Start time: ____________ Julie Cummings time: ____________ Date: ____________ Movements: ____________ Start time: ____________ Julie Cummings time: ____________  Date: ____________ Movements: ____________ Start time: ____________ Julie Cummings time: ____________ Date: ____________ Movements: ____________ Start time: ____________ Julie Cummings time: ____________ Date: ____________ Movements: ____________ Start time: ____________ Julie Cummings time: ____________ Date: ____________ Movements: ____________ Start time: ____________ Julie Cummings time: ____________ Date: ____________ Movements: ____________ Start time: ____________ Julie Cummings time: ____________ Date: ____________ Movements: ____________ Start time: ____________ Julie Cummings time: ____________   This information is not intended to replace advice given to you by your health care provider. Make sure you discuss any questions you have with your health care provider.   Document Released: 02/15/2006 Document Revised: 02/06/2014 Document Reviewed: 11/13/2011 Elsevier Interactive Patient Education [employer]

## 2015-05-18 ENCOUNTER — Encounter (HOSPITAL_COMMUNITY): Payer: Self-pay | Admitting: *Deleted

## 2015-05-18 ENCOUNTER — Ambulatory Visit (INDEPENDENT_AMBULATORY_CARE_PROVIDER_SITE_OTHER): Payer: Medicaid Other | Admitting: Advanced Practice Midwife

## 2015-05-18 ENCOUNTER — Encounter: Payer: Self-pay | Admitting: Advanced Practice Midwife

## 2015-05-18 ENCOUNTER — Telehealth: Payer: Self-pay | Admitting: *Deleted

## 2015-05-18 ENCOUNTER — Inpatient Hospital Stay (HOSPITAL_COMMUNITY)
Admission: AD | Admit: 2015-05-18 | Discharge: 2015-05-18 | Disposition: A | Discharge status: Home / Self Care | Payer: Medicaid Other | Source: Ambulatory Visit | Attending: Family Medicine | Admitting: Family Medicine

## 2015-05-18 ENCOUNTER — Inpatient Hospital Stay (HOSPITAL_COMMUNITY): Payer: Medicaid Other | Admitting: Anesthesiology

## 2015-05-18 ENCOUNTER — Inpatient Hospital Stay (HOSPITAL_COMMUNITY)
Admission: AD | Admit: 2015-05-18 | Discharge: 2015-05-20 | DRG: 774 | Disposition: A | Discharge status: Home / Self Care | Payer: Medicaid Other | Source: Ambulatory Visit | Attending: Family Medicine | Admitting: Family Medicine

## 2015-05-18 VITALS — BP 120/90 | HR 88 | Wt 222.4 lb

## 2015-05-18 DIAGNOSIS — Z6839 Body mass index (BMI) 39.0-39.9, adult: Secondary | ICD-10-CM

## 2015-05-18 DIAGNOSIS — O99344 Other mental disorders complicating childbirth: Secondary | ICD-10-CM | POA: Diagnosis present

## 2015-05-18 DIAGNOSIS — Z3493 Encounter for supervision of normal pregnancy, unspecified, third trimester: Secondary | ICD-10-CM

## 2015-05-18 DIAGNOSIS — A5901 Trichomonal vulvovaginitis: Secondary | ICD-10-CM | POA: Diagnosis present

## 2015-05-18 DIAGNOSIS — O99891 Other specified diseases and conditions complicating pregnancy: Secondary | ICD-10-CM

## 2015-05-18 DIAGNOSIS — K219 Gastro-esophageal reflux disease without esophagitis: Secondary | ICD-10-CM | POA: Diagnosis present

## 2015-05-18 DIAGNOSIS — Z3A40 40 weeks gestation of pregnancy: Secondary | ICD-10-CM | POA: Diagnosis not present

## 2015-05-18 DIAGNOSIS — O9989 Other specified diseases and conditions complicating pregnancy, childbirth and the puerperium: Secondary | ICD-10-CM

## 2015-05-18 DIAGNOSIS — O26893 Other specified pregnancy related conditions, third trimester: Secondary | ICD-10-CM | POA: Diagnosis present

## 2015-05-18 DIAGNOSIS — A599 Trichomoniasis, unspecified: Secondary | ICD-10-CM | POA: Diagnosis present

## 2015-05-18 DIAGNOSIS — Z8759 Personal history of other complications of pregnancy, childbirth and the puerperium: Secondary | ICD-10-CM

## 2015-05-18 DIAGNOSIS — Z87440 Personal history of urinary (tract) infections: Secondary | ICD-10-CM

## 2015-05-18 DIAGNOSIS — Z3490 Encounter for supervision of normal pregnancy, unspecified, unspecified trimester: Secondary | ICD-10-CM

## 2015-05-18 DIAGNOSIS — F419 Anxiety disorder, unspecified: Secondary | ICD-10-CM | POA: Diagnosis present

## 2015-05-18 DIAGNOSIS — O360121 Maternal care for anti-D [Rh] antibodies, second trimester, fetus 1: Secondary | ICD-10-CM

## 2015-05-18 DIAGNOSIS — Z833 Family history of diabetes mellitus: Secondary | ICD-10-CM

## 2015-05-18 DIAGNOSIS — Z6791 Unspecified blood type, Rh negative: Secondary | ICD-10-CM | POA: Diagnosis not present

## 2015-05-18 DIAGNOSIS — O99214 Obesity complicating childbirth: Secondary | ICD-10-CM | POA: Diagnosis present

## 2015-05-18 DIAGNOSIS — O26899 Other specified pregnancy related conditions, unspecified trimester: Secondary | ICD-10-CM | POA: Diagnosis present

## 2015-05-18 DIAGNOSIS — Z8249 Family history of ischemic heart disease and other diseases of the circulatory system: Secondary | ICD-10-CM | POA: Diagnosis not present

## 2015-05-18 DIAGNOSIS — Z823 Family history of stroke: Secondary | ICD-10-CM | POA: Diagnosis not present

## 2015-05-18 DIAGNOSIS — Z8659 Personal history of other mental and behavioral disorders: Secondary | ICD-10-CM

## 2015-05-18 DIAGNOSIS — Z1389 Encounter for screening for other disorder: Secondary | ICD-10-CM

## 2015-05-18 DIAGNOSIS — O9962 Diseases of the digestive system complicating childbirth: Secondary | ICD-10-CM | POA: Diagnosis present

## 2015-05-18 DIAGNOSIS — Z3483 Encounter for supervision of other normal pregnancy, third trimester: Secondary | ICD-10-CM

## 2015-05-18 DIAGNOSIS — O9832 Other infections with a predominantly sexual mode of transmission complicating childbirth: Secondary | ICD-10-CM | POA: Diagnosis present

## 2015-05-18 DIAGNOSIS — Z3A39 39 weeks gestation of pregnancy: Secondary | ICD-10-CM

## 2015-05-18 DIAGNOSIS — Z87891 Personal history of nicotine dependence: Secondary | ICD-10-CM

## 2015-05-18 DIAGNOSIS — Z331 Pregnant state, incidental: Secondary | ICD-10-CM

## 2015-05-18 LAB — CBC
HCT: 37.3 % (ref 36.0–46.0)
Hemoglobin: 12.8 g/dL (ref 12.0–15.0)
MCH: 29.8 pg (ref 26.0–34.0)
MCHC: 34.3 g/dL (ref 30.0–36.0)
MCV: 86.9 fL (ref 78.0–100.0)
Platelets: 230 10*3/uL (ref 150–400)
RBC: 4.29 MIL/uL (ref 3.87–5.11)
RDW: 13.5 % (ref 11.5–15.5)
WBC: 13.6 10*3/uL — ABNORMAL HIGH (ref 4.0–10.5)

## 2015-05-18 LAB — POCT URINALYSIS DIPSTICK
Glucose, UA: NEGATIVE
Leukocytes, UA: NEGATIVE
Nitrite, UA: NEGATIVE
RBC UA: NEGATIVE

## 2015-05-18 LAB — OB RESULTS CONSOLE GBS: GBS: NEGATIVE

## 2015-05-18 MED ORDER — ACETAMINOPHEN 325 MG PO TABS
650.0000 mg | ORAL_TABLET | ORAL | Status: DC | PRN
  Administered 2015-05-19 – 2015-05-20 (×2): 650 mg via ORAL
  Filled 2015-05-18: qty 2

## 2015-05-18 MED ORDER — PHENYLEPHRINE 40 MCG/ML (10ML) SYRINGE FOR IV PUSH (FOR BLOOD PRESSURE SUPPORT)
80.0000 ug | PREFILLED_SYRINGE | INTRAVENOUS | Status: DC | PRN
  Administered 2015-05-18: 80 ug via INTRAVENOUS
  Filled 2015-05-18: qty 2

## 2015-05-18 MED ORDER — CITRIC ACID-SODIUM CITRATE 334-500 MG/5ML PO SOLN: 30.0000 mL | ORAL | Status: DC | PRN

## 2015-05-18 MED ORDER — LIDOCAINE HCL (PF) 1 % IJ SOLN
INTRAMUSCULAR | Status: DC | PRN
  Administered 2015-05-18 (×2): 5 mL via EPIDURAL

## 2015-05-18 MED ORDER — PHENYLEPHRINE 40 MCG/ML (10ML) SYRINGE FOR IV PUSH (FOR BLOOD PRESSURE SUPPORT)
80.0000 ug | PREFILLED_SYRINGE | INTRAVENOUS | Status: DC | PRN
  Filled 2015-05-18: qty 20
  Filled 2015-05-18: qty 2

## 2015-05-18 MED ORDER — LACTATED RINGERS IV SOLN
2.5000 [IU]/h | INTRAVENOUS | Status: DC
  Administered 2015-05-19: 2.5 [IU]/h via INTRAVENOUS
  Filled 2015-05-18: qty 4

## 2015-05-18 MED ORDER — DIPHENHYDRAMINE HCL 50 MG/ML IJ SOLN: 12.5000 mg | INTRAMUSCULAR | Status: DC | PRN

## 2015-05-18 MED ORDER — FLEET ENEMA 7-19 GM/118ML RE ENEM: 1.0000 | ENEMA | RECTAL | Status: DC | PRN

## 2015-05-18 MED ORDER — HYDROXYZINE HCL 50 MG PO TABS
50.0000 mg | ORAL_TABLET | Freq: Three times a day (TID) | ORAL | Status: DC | PRN
  Filled 2015-05-18: qty 1

## 2015-05-18 MED ORDER — LIDOCAINE HCL (PF) 1 % IJ SOLN
30.0000 mL | INTRAMUSCULAR | Status: DC | PRN
  Filled 2015-05-18: qty 30

## 2015-05-18 MED ORDER — EPHEDRINE 5 MG/ML INJ
10.0000 mg | INTRAVENOUS | Status: DC | PRN
  Filled 2015-05-18: qty 2

## 2015-05-18 MED ORDER — HYDROXYZINE HCL 50 MG PO TABS
50.0000 mg | ORAL_TABLET | Freq: Once | ORAL | Status: AC | PRN
  Administered 2015-05-18: 50 mg via ORAL
  Filled 2015-05-18: qty 1

## 2015-05-18 MED ORDER — LACTATED RINGERS IV SOLN: 500.0000 mL | INTRAVENOUS | Status: DC | PRN

## 2015-05-18 MED ORDER — ONDANSETRON HCL 4 MG/2ML IJ SOLN: 4.0000 mg | Freq: Four times a day (QID) | INTRAMUSCULAR | Status: DC | PRN

## 2015-05-18 MED ORDER — ALBUTEROL SULFATE (2.5 MG/3ML) 0.083% IN NEBU: 3.0000 mL | INHALATION_SOLUTION | Freq: Four times a day (QID) | RESPIRATORY_TRACT | Status: DC | PRN

## 2015-05-18 MED ORDER — OXYTOCIN BOLUS FROM INFUSION
500.0000 mL | INTRAVENOUS | Status: DC
  Administered 2015-05-19: 500 mL via INTRAVENOUS

## 2015-05-18 MED ORDER — FENTANYL 2.5 MCG/ML BUPIVACAINE 1/10 % EPIDURAL INFUSION (WH - ANES)
14.0000 mL/h | INTRAMUSCULAR | Status: DC | PRN
  Administered 2015-05-18: 14 mL/h via EPIDURAL
  Filled 2015-05-18: qty 125

## 2015-05-18 MED ORDER — LACTATED RINGERS IV SOLN
500.0000 mL | Freq: Once | INTRAVENOUS | Status: DC
Start: 2015-05-18 — End: 2015-05-19

## 2015-05-18 MED ORDER — LACTATED RINGERS IV SOLN
INTRAVENOUS | Status: DC
  Administered 2015-05-18 (×3): via INTRAVENOUS

## 2015-05-18 MED ORDER — HYDROXYZINE HCL 50 MG PO TABS: 50.0000 mg | ORAL_TABLET | Freq: Four times a day (QID) | ORAL | Status: DC | PRN

## 2015-05-18 NOTE — Telephone Encounter (Signed)
Pt states she has been contracting since 3:30 this morning, [facility] and [facility] have [facility] home because she did not dilate past 1.5 while there.  Pt in tears on the phone saying she is extremely uncomfortable and wanted to come here and see Drenda FreezeFran to have her membranes swiped or something else to be done to help her dilate.  Spoke with Drenda FreezeFran and she states to have pt come in and she will swipe membranes but that is all we can do for her at this point.  Pt verbalized understanding.

## 2015-05-18 NOTE — H&P (Signed)
LABOR HISTORY AND PHYSICAL  Chief Complaint:    Julie Cummings is a 25 y.o.  [id number] with IUP at 1489w6d presenting for labor pain and contractions. Patient states she has been having  regular, every 5 minutes contractions that began at 3:30 this morning; complaining of "back labor". She was seen in the MAU this morning at 1cm and was sent home. Pt was directly admitted [facility] and cervical exam was 4cm. Reports no vaginal bleeding, intact membranes- denies fluid leakage or gush of fluid. Reports active fetal movement. Pt is having a girl, plans on bottle feeding and [name], but is uncertain if she wants to follow through with it.   Past Medical History  Diagnosis Date  . No pertinent past medical history   . Gall stones   . History of UTI   . GERD (gastroesophageal reflux disease)     Past Surgical History  Procedure Laterality Date  . Tonsillectomy    . Cholecystectomy  12/2010  . Ercp      Family History  Problem Relation Age of Onset  . Hypertension Sister   . CAD Maternal Grandfather   . Diabetes Maternal Grandfather   . Stroke Maternal Grandfather   . Heart attack Maternal Grandfather   . Heart disease Maternal Grandfather   . Diabetes Paternal Grandfather   . Heart disease Paternal Grandfather   . Stroke Paternal Grandfather   . Heart attack Paternal Grandfather   . Hypertension Other     grandparents  . Diabetes Mother   . Thyroid disease Maternal Grandmother     Social History  Substance Use Topics  . Smoking status: Former Smoker -- .5 years    Types: Cigarettes  . Smokeless tobacco: Never Used     Comment: quit in 2011  . Alcohol Use: No    Allergies  Allergen Reactions  . Morphine And Related Other (See Comments)    Racing heart; when patient had gallbladder taken out in December 2012, MD told patient that her heart was racing and not to take Morphine again.    Prescriptions  prior to admission  Medication Sig Dispense Refill Last Dose  . albuterol (PROVENTIL HFA;VENTOLIN HFA) 108 (90 Base) MCG/ACT inhaler Inhale 2 puffs into the lungs every 6 (six) hours as needed for wheezing or shortness of breath. (Patient not taking: Reported on 05/18/2015) 1 Inhaler 2 Not Taking  . [DISCONTINUED] acetaminophen (TYLENOL) 500 MG tablet Take 500 mg by mouth as needed for moderate pain or headache. Reported on 02/03/2015   Taking  . [DISCONTINUED] calcium carbonate (TUMS EX) 750 MG chewable tablet Chew 2 tablets by mouth 2 (two) times daily as needed.    Taking  . [DISCONTINUED] hydrOXYzine (ATARAX/VISTARIL) 50 MG tablet Take 1 tablet (50 mg total) by mouth every 6 (six) hours as needed for anxiety. 30 tablet 0 Taking  . [DISCONTINUED] omeprazole (PRILOSEC) 20 MG capsule Take 1 capsule (20 mg total) by mouth daily. (Patient not taking: Reported on 05/18/2015) 30 capsule 2 Not Taking  . [DISCONTINUED] Pediatric Multiple Vit-C-FA (FLINSTONES GUMMIES OMEGA-3 DHA) CHEW Chew 2 tablets by mouth daily.    Taking    Review of Systems - Negative except for what is mentioned in HPI.  Physical Exam  Blood pressure 121/97, pulse 101, height 5\' 3"  (1.6 m), weight 100.699 kg (222 lb), last menstrual period 08/12/2014, unknown if currently breastfeeding. GENERAL: Well-developed, well-nourished female in no acute distress-  in moderate pain with contractions LUNGS: Clear to auscultation bilaterally.  HEART: Regular rate and rhythm. ABDOMEN: Soft, nontender, nondistended EXTREMITIES: Nontender, no edema, 2+ distal pulses. Cervical Exam: 4cm at [facility] at 1530 FHT:  Baseline 135 / mod/ +accels, no decels Contractions: every 3-4 minutes   Labs: Results for orders placed or performed during the hospital encounter of 05/18/15 (from the past 24 hour(s))  OB RESULT CONSOLE Group B Strep   Collection Time: 05/18/15 12:00 AM  Result Value Ref Range   GBS Negative   CBC   Collection Time: 05/18/15   5:00 PM  Result Value Ref Range   WBC 13.6 (H) 4.0 - 10.5 K/uL   RBC 4.29 3.87 - 5.11 MIL/uL   Hemoglobin 12.8 12.0 - 15.0 g/dL   HCT 16.1 09.6 - 04.5 %   MCV 86.9 78.0 - 100.0 fL   MCH 29.8 26.0 - 34.0 pg   MCHC 34.3 30.0 - 36.0 g/dL   RDW 40.9 81.1 - 91.4 %   Platelets 230 150 - 400 K/uL  Results for orders placed or performed in visit on 05/18/15 (from the past 24 hour(s))  POCT urinalysis dipstick   Collection Time: 05/18/15  3:25 PM  Result Value Ref Range   Color, UA     Clarity, UA     Glucose, UA neg    Bilirubin, UA     Ketones, UA small    Spec Grav, UA     Blood, UA neg    pH, UA     Protein, UA trace    Urobilinogen, UA     Nitrite, UA neg    Leukocytes, UA Negative Negative    Imaging Studies:  02/25/2015 [name] 28+1wks,cephalic,cx 3.9cm,post pl, normal ov's bilat,svp of fluid 4.8cm,fhr 132 bpm,efw 1266g,60%,facial anatomy complete,no obvious abn seen   Assessment: Julie Cummings is  25 y.o. [id number] at [redacted]w[redacted]d presents with contractions in term pregnancy, directly admitted to LD  Plan: Admit to L/D and monitor labor progression.  Labor: Expectant management. Consider AROM if ctx pattern spaces Pain: Pt requested epidural  FWB: cat I GBS: neg, no need abx MOF: Bottle MOC: IUD (unsure)  Federico Flake, MD   HP performed with  Julie Inch PA-S 4/18/20175:21 PM   Attending physician: Tinnie Gens MD

## 2015-05-18 NOTE — MAU Note (Signed)
PT SAYS HURT SINCE 0340.   VE IN OFFICE  1 CM- LAST Tuesday.      DENIES HSV AND  MRSA.   GBS- NEG.          [facility]

## 2015-05-18 NOTE — Anesthesia Preprocedure Evaluation (Signed)
Anesthesia Evaluation  Patient identified by MRN, date of birth, ID band Patient awake    Reviewed: Allergy & Precautions, H&P , NPO status , Patient's Chart, lab work & pertinent test results, reviewed documented beta blocker date and time   History of Anesthesia Complications Negative for: history of anesthetic complications  Airway Mallampati: III  TM Distance: >3 FB Neck ROM: full    Dental  (+) Teeth Intact, Dental Advisory Given   Pulmonary neg pulmonary ROS, former smoker,    breath sounds clear to auscultation       Cardiovascular negative cardio ROS   Rhythm:regular Rate:Normal     Neuro/Psych negative neurological ROS  negative psych ROS   GI/Hepatic Neg liver ROS, GERD  Medicated,  Endo/Other  Morbid obesityObesity - BMI 39.1  Renal/GU negative Renal ROS     Musculoskeletal   Abdominal   Peds  Hematology negative hematology ROS (+)   Anesthesia Other Findings   Reproductive/Obstetrics (+) Pregnancy                             Anesthesia Physical  Anesthesia Plan  ASA: II  Anesthesia Plan: Epidural   Post-op Pain Management:    Induction:   Airway Management Planned:   Additional Equipment:   Intra-op Plan:   Post-operative Plan:   Informed Consent: I have reviewed the patients History and Physical, chart, labs and discussed the procedure including the risks, benefits and alternatives for the proposed anesthesia with the patient or authorized representative who has indicated his/her understanding and acceptance.   Dental advisory given  Plan Discussed with: Anesthesiologist  Anesthesia Plan Comments:         Anesthesia Quick Evaluation

## 2015-05-18 NOTE — Anesthesia Procedure Notes (Signed)
Epidural Patient location during procedure: OB Start time: 05/18/2015 5:26 PM End time: 05/18/2015 5:36 PM  Staffing Anesthesiologist: Heather RobertsSINGER, Kimani Bedoya Performed by: anesthesiologist   Preanesthetic Checklist Completed: patient identified, site marked, pre-op evaluation, timeout performed, IV checked, risks and benefits discussed and monitors and equipment checked  Epidural Patient position: sitting Prep: DuraPrep Patient monitoring: heart rate, cardiac monitor, continuous pulse ox and blood pressure Approach: midline Location: L2-L3 Injection technique: LOR saline  Needle:  Needle type: Tuohy  Needle gauge: 17 G Needle length: 9 cm Needle insertion depth: 6 cm Catheter size: 20 Guage Catheter at skin depth: 11 cm Test dose: negative and Other  Assessment Events: blood not aspirated, injection not painful, no injection resistance and negative IV test  Additional Notes Informed consent obtained prior to proceeding including risk of failure, 1% risk of PDPH, risk of minor discomfort and bruising.  Discussed rare but serious complications including epidural abscess, permanent nerve injury, epidural hematoma.  Discussed alternatives to epidural analgesia and patient desires to proceed.  Timeout performed pre-procedure verifying patient name, procedure, and platelet count.  Patient tolerated procedure well.

## 2015-05-18 NOTE — Progress Notes (Signed)
Seen in MAU all am w/o cervical change (1cm), still contracting q 5 minutes.  Now 4.  Direct admit.  [facility]/L&D (harmony) and MAU (Dee) notified. Geraldine ContrasDee will let front desk know.

## 2015-05-19 ENCOUNTER — Encounter: Payer: Medicaid Other | Admitting: Advanced Practice Midwife

## 2015-05-19 ENCOUNTER — Encounter (HOSPITAL_COMMUNITY): Payer: Self-pay | Admitting: *Deleted

## 2015-05-19 DIAGNOSIS — Z3A39 39 weeks gestation of pregnancy: Secondary | ICD-10-CM

## 2015-05-19 DIAGNOSIS — O9962 Diseases of the digestive system complicating childbirth: Secondary | ICD-10-CM

## 2015-05-19 DIAGNOSIS — O9832 Other infections with a predominantly sexual mode of transmission complicating childbirth: Secondary | ICD-10-CM

## 2015-05-19 DIAGNOSIS — O99214 Obesity complicating childbirth: Secondary | ICD-10-CM

## 2015-05-19 DIAGNOSIS — O99344 Other mental disorders complicating childbirth: Secondary | ICD-10-CM

## 2015-05-19 DIAGNOSIS — Z87891 Personal history of nicotine dependence: Secondary | ICD-10-CM

## 2015-05-19 DIAGNOSIS — A5901 Trichomonal vulvovaginitis: Secondary | ICD-10-CM

## 2015-05-19 DIAGNOSIS — Z87440 Personal history of urinary (tract) infections: Secondary | ICD-10-CM

## 2015-05-19 LAB — RPR: RPR: NONREACTIVE

## 2015-05-19 MED ORDER — ACETAMINOPHEN 325 MG PO TABS
650.0000 mg | ORAL_TABLET | ORAL | Status: DC | PRN
  Filled 2015-05-19: qty 2

## 2015-05-19 MED ORDER — SIMETHICONE 80 MG PO CHEW: 80.0000 mg | CHEWABLE_TABLET | ORAL | Status: DC | PRN

## 2015-05-19 MED ORDER — DIPHENHYDRAMINE HCL 25 MG PO CAPS: 25.0000 mg | ORAL_CAPSULE | Freq: Four times a day (QID) | ORAL | Status: DC | PRN

## 2015-05-19 MED ORDER — ONDANSETRON HCL 4 MG/2ML IJ SOLN: 4.0000 mg | INTRAMUSCULAR | Status: DC | PRN

## 2015-05-19 MED ORDER — PRENATAL MULTIVITAMIN CH
1.0000 | ORAL_TABLET | Freq: Every day | ORAL | Status: DC
Start: 2015-05-19 — End: 2015-05-20
  Administered 2015-05-19: 1 via ORAL
  Filled 2015-05-19: qty 1

## 2015-05-19 MED ORDER — RHO D IMMUNE GLOBULIN 1500 UNIT/2ML IJ SOSY
300.0000 ug | PREFILLED_SYRINGE | Freq: Once | INTRAMUSCULAR | Status: AC
  Administered 2015-05-19: 300 ug via INTRAVENOUS
  Filled 2015-05-19: qty 2

## 2015-05-19 MED ORDER — COCONUT OIL OIL: 1.0000 "application " | TOPICAL_OIL | Status: DC | PRN

## 2015-05-19 MED ORDER — TETANUS-DIPHTH-ACELL PERTUSSIS 5-2.5-18.5 LF-MCG/0.5 IM SUSP
0.5000 mL | Freq: Once | INTRAMUSCULAR | Status: AC
  Administered 2015-05-20: 0.5 mL via INTRAMUSCULAR
  Filled 2015-05-19: qty 0.5

## 2015-05-19 MED ORDER — OXYCODONE-ACETAMINOPHEN 5-325 MG PO TABS: 1.0000 | ORAL_TABLET | ORAL | Status: DC | PRN

## 2015-05-19 MED ORDER — ONDANSETRON HCL 4 MG PO TABS: 4.0000 mg | ORAL_TABLET | ORAL | Status: DC | PRN

## 2015-05-19 MED ORDER — WITCH HAZEL-GLYCERIN EX PADS: 1.0000 "application " | MEDICATED_PAD | CUTANEOUS | Status: DC | PRN

## 2015-05-19 MED ORDER — IBUPROFEN 600 MG PO TABS
600.0000 mg | ORAL_TABLET | Freq: Four times a day (QID) | ORAL | Status: DC
  Administered 2015-05-19 – 2015-05-20 (×6): 600 mg via ORAL
  Filled 2015-05-19 (×6): qty 1

## 2015-05-19 MED ORDER — OXYCODONE-ACETAMINOPHEN 5-325 MG PO TABS: 2.0000 | ORAL_TABLET | ORAL | Status: DC | PRN

## 2015-05-19 MED ORDER — BENZOCAINE-MENTHOL 20-0.5 % EX AERO: 1.0000 "application " | INHALATION_SPRAY | CUTANEOUS | Status: DC | PRN

## 2015-05-19 MED ORDER — DOCUSATE SODIUM 100 MG PO CAPS
100.0000 mg | ORAL_CAPSULE | Freq: Two times a day (BID) | ORAL | Status: DC
  Filled 2015-05-19: qty 1

## 2015-05-19 MED ORDER — DIBUCAINE 1 % RE OINT: 1.0000 "application " | TOPICAL_OINTMENT | RECTAL | Status: DC | PRN

## 2015-05-19 NOTE — Anesthesia Postprocedure Evaluation (Signed)
Anesthesia Post Note  Patient: Julie Cummings  Procedure(s) Performed: * No procedures listed *  Patient location during evaluation: Mother Baby Anesthesia Type: Epidural Level of consciousness: awake and alert Pain management: pain level controlled Vital Signs Assessment: post-procedure vital signs reviewed and stable Respiratory status: spontaneous breathing Cardiovascular status: stable Postop Assessment: no headache, no backache, epidural receding and patient able to bend at knees Anesthetic complications: no    Last Vitals:  Filed Vitals:   05/19/15 0315 05/19/15 0415  BP: 118/62 105/60  Pulse: 84 86  Temp: 36.9 C 36.7 C  Resp: 18 18    Last Pain:  Filed Vitals:   05/19/15 0745  PainSc: 0-No pain                 Edison PaceWILKERSON,Sophia Cubero

## 2015-05-20 LAB — RH IG WORKUP (INCLUDES ABO/RH)
ABO/RH(D): B NEG
FETAL SCREEN: NEGATIVE
Gestational Age(Wks): 40
UNIT DIVISION: 0

## 2015-05-20 MED ORDER — SERTRALINE HCL 25 MG PO TABS: 25.0000 mg | ORAL_TABLET | Freq: Every day | ORAL | Status: DC

## 2015-05-20 MED ORDER — IBUPROFEN 600 MG PO TABS: 600.0000 mg | ORAL_TABLET | Freq: Four times a day (QID) | ORAL | Status: DC

## 2015-05-20 NOTE — Progress Notes (Signed)
POSTPARTUM PROGRESS NOTE  Post Partum Day 1  Subjective:  Julie Cummings is a 25 y.o. [id number] 7147w0d s/p SVD.  No acute events overnight.  Pt denies problems with ambulating, voiding or po intake.  She denies nausea or vomiting.  Pain is well controlled, though she reports abdominal and back pain relieved somewhat by motrin.  She has had flatus. She has had bowel movement.  Lochia small.   Objective: Blood pressure 110/70, pulse 60, temperature 97.5 F (36.4 C), temperature source Axillary, resp. rate 20, height 5\' 3"  (1.6 m), weight 100.699 kg (222 lb), last menstrual period 08/12/2014, SpO2 99 %, unknown if currently breastfeeding.  Physical Exam:  General: alert, cooperative and no distress Lochia:normal flow Chest: CTAB Heart: RRR no m/r/g Abdomen: +BS, soft, nontender Uterine Fundus: firm, at the level of the umbilicus DVT Evaluation: No calf swelling or tenderness Extremities: no edema, intact pulses GU: no CVA tenderness   Recent Labs  05/18/15 1700  HGB 12.8  HCT 37.3    Assessment/Plan:  ASSESSMENT: Julie Cummings is a 25 y.o. [id number] 6947w0d s/p SVD and is doing quite well other than mild abdominal cramps which she says are relieved by motrin. She has received her shot of Rhogam yesterday afternoon. Could be discharged today.  Discharge home   LOS: 2 days   Malka Sohomas Daubert 05/20/2015, 7:40 AM   OB fellow attestation: I have seen and examined this patient; I agree with above documentation in the medical student's note. Please see discharge summary for official documentation.   Federico FlakeKimberly Niles Eugina Row, MD 4:13 PM

## 2015-05-20 NOTE — Clinical Social Work Maternal (Signed)
CLINICAL SOCIAL WORK MATERNAL/CHILD NOTE  Patient Details  Name: Julie Cummings MRN: [id number] Date of Birth: 09/13/1990  Date:  05/20/2015  Clinical Social Worker Initiating Note:  Majed Pellegrin MSW, LCSW Date/ Time Initiated:  05/20/15/0900     Child's Name:  Julie Cummings   Legal Guardian:  [name]   Need for Interpreter:  None   Date of Referral:  05/19/15     Reason for Referral:  History of postpartum depression  Referral Source:  [facility]   Address:  [address] [place], [place]   Phone number:  [phone]   Household Members:  Minor Children, Spouse   Natural Supports (not living in the home):  Extended Family, Immediate Family, Friends   Professional Supports: None   Financial Resources:  Medicaid   Other Resources:    None identified  Cultural/Religious Considerations Which May Impact Care:  None reported  Strengths:  Ability to meet basic needs , Pediatrician chosen , Home prepared for child    Risk Factors/Current Problems:   1. Mental Health Concerns-- History of postpartum depression    Cognitive State:  Able to Concentrate , Alert , Insightful , Goal Oriented , Linear Thinking    Mood/Affect:  Animated, Happy , Interested , Tearful    CSW Assessment:  CSW received request for consult due to MOB presenting with a history of postpartum depression.   MOB presented as easily engaged and receptive to the visit. Mood and affect were appropriate to the situation.  FOB was also in the room, but he was observed to be caring for the infant, and did not participate.  MOB confirmed history of postpartum depression after her second child was born. She shared that she experienced heightened anxiety during her second pregnancy, including heightened anxiety during labor as she ruminated over her anxieties, worries, and fears.  MOB stated that at approximately month 4-5 postpartum, she noted onset of postpartum depression. She shared that it was  difficult for her to talk to other people about her feelings since she felt embarrassed and felt that no one would understand her.  MOB reflected upon how she was able to eventually talk to her family, and found a friend that has a history of mental illness. She continued to discuss how she learned how to utilize her Blondina to assist her to cope, and various distraction techniques (such as cleaning and coloring).  MOB shared that she became focused on taking it "one day at a time", and symptoms eventually resolved in about 2-3 months.  MOB became appropriately tearful as she reflected upon her experience since she said that it is difficult to think about. She shared that she felt like a bad mother, and often had to force herself to spend time with her children. She discussed at length how she did not feel like a good partner or mother, and was not the person she wanted to be.  Per MOB, she did mention symptoms to her doctor who recommended Celexa. She stated that she took it one day, and falsely connected increase in heart palpitations to the new medication.  MOB stated that symptoms eventually resolved without medication in 2-3 months.   MOB discussed how she is concerned about re-experiencing symptoms. She stated that she does not want to experience postpartum depression, but is not yet sure how she wants to proceed as she transitions postpartum.  MOB was receptive to exploring her thoughts and feelings surrounding medication, including the potential positive and negative aspects   of starting medication and not starting medication.  After exploring her feelings, MOB stated that she wants to be as proactive as possible, and is interested in talking with her medical provider about potential medications postpartum.  MOB was also receptive to reviewing previously learned emotional regulation skills that she can incorporate into her daily routine.  As CSW reviewed education on perinatal mood disorders, CSW reviewed  commonality of symptoms in order to normalize her prior experience.  MOB was able to recognize that symptoms are treatable, and expressed awareness that even if she experiences symptoms again, that she will be okay.    MOB expressed appreciation for the visit and support, and acknowledged ongoing availability of CSW if needs arise.  CSW Plan/Description:   1. Patient/Family Education-- perinatal mood and anxiety disorders 2. CSW consulted with Dr. Newton regarding MOB's desires to be assessed for medications prior to discharge. 3. No Further Intervention Required/No Barriers to Discharge    Aracelie Addis N, LCSW 05/20/2015, 2:09 PM  

## 2015-05-20 NOTE — Discharge Summary (Signed)
OB Discharge Summary     Patient Name: Julie Cummings DOB: 07-Oct-1990 MRN: [id number]  Date of admission: 05/18/2015 Delivering MD: Lyndel SafeNEWTON, KIMBERLY NILES   Date of discharge: 05/20/2015  Admitting diagnosis: labor Intrauterine pregnancy: 837w0d     Secondary diagnosis:  Principal Problem:   NSVD (normal spontaneous vaginal delivery) Active Problems:   Supervision of normal pregnancy   Rh negative state in antepartum period   History of eclampsia   Anxiety   History of postpartum depression, currently pregnant   Trichomonas infection   Pregnancy  Additional problems: none     Discharge diagnosis: Term Pregnancy Delivered                                                                                                Post partum procedures:rhogam  Augmentation: AROM  Complications: None  Hospital course:  Onset of Labor With Vaginal Delivery     25 y.o. yo [phone] at 437w0d was admitted in Active Labor on 05/18/2015. Patient had an uncomplicated labor course as follows:  Membrane Rupture Time/Date: 12:12 AM ,05/19/2015   Intrapartum Procedures: Episiotomy: None [1]                                         Lacerations:  None [1]  Patient had a delivery of a Viable infant. 05/19/2015  Information for the patient's newborn:  Chong SicilianBarajas, Girl Julie Cummings [[id number]][[id number]]  Delivery Method: Vaginal, Spontaneous Delivery (Filed from Delivery Summary)   Pateint had an uncomplicated postpartum course. She was seen by SW given her history of postpartum depression. She expressed interest in starting a medication and decided to start Zoloft 25mg  with rapid increase to 50mg  if no side effects.   She is ambulating, tolerating a regular diet, passing flatus, and urinating well. Patient is discharged home in stable condition on 05/20/2015.    Physical exam  Filed Vitals:   05/19/15 0815 05/19/15 1650 05/19/15 1819 05/20/15 0643  BP: 110/80 108/72 120/65 110/70  Pulse: 68 70 65 60  Temp: 98.2 F (36.8  C) 98.5 F (36.9 C) 97.5 F (36.4 C) 97.5 F (36.4 C)  TempSrc: Oral Oral Oral Axillary  Resp: 18 20 18 20   Height:      Weight:      SpO2:   99%    General: alert, cooperative and no distress Lochia: appropriate Uterine Fundus: firm Incision: N/A DVT Evaluation: No evidence of DVT seen on physical exam. Negative Homan's sign. Labs: Lab Results  Component Value Date   WBC 13.6* 05/18/2015   HGB 12.8 05/18/2015   HCT 37.3 05/18/2015   MCV 86.9 05/18/2015   PLT 230 05/18/2015   CMP Latest Ref Rng 12/01/2010  Glucose 70 - 99 mg/dL 440(H103(H)  BUN 6 - 23 mg/dL 5(L)  Creatinine 4.740.50 - 1.10 mg/dL 2.590.68  Sodium 563135 - 875145 mEq/L 135  Potassium 3.5 - 5.1 mEq/L 3.2(L)  Chloride 96 - 112 mEq/L 102  CO2 19 - 32 mEq/L 23  Calcium 8.4 -  10.5 mg/dL 9.1  Total Protein 6.0 - 8.3 g/dL 5.9(L)  Total Bilirubin 0.3 - 1.2 mg/dL 1.2  Alkaline Phos 39 - 117 U/L 307(H)  AST 0 - 37 U/L 15  ALT 0 - 35 U/L 7    Discharge instruction: per After Visit Summary and "Baby and Me Booklet".  After visit meds:    Medication List    TAKE these medications        albuterol 108 (90 Base) MCG/ACT inhaler  Commonly known as:  PROVENTIL HFA;VENTOLIN HFA  Inhale 2 puffs into the lungs every 6 (six) hours as needed for wheezing or shortness of breath.     ibuprofen 600 MG tablet  Commonly known as:  ADVIL,MOTRIN  Take 1 tablet (600 mg total) by mouth every 6 (six) hours.     sertraline 25 MG tablet  Commonly known as:  ZOLOFT  Take 1 tablet (25 mg total) by mouth daily.        Diet: routine diet  Activity: Advance as tolerated. Pelvic rest for 6 weeks.   Outpatient follow up:2 weeks for [facility] check at [facility] given new start of Zoloft Follow up Appt: Future Appointments Date Time Provider Department Center  06/23/2015 1:30 PM Jacklyn Shell, CNM [facility]   Follow up Visit:No Follow-up on file.  Postpartum contraception: IUD Mirena  Newborn Data: Live born female   Birth Weight: 7 lb 2.3 oz (3240 g) APGAR: 9, 9  Baby Feeding: Bottle Disposition:home with mother  05/20/2015 Federico Flake, MD   Attending physician: Catalina Antigua MD

## 2015-05-20 NOTE — Discharge Instructions (Signed)
You can go to www.goodrx.com to look up coupons for your new medication after your medicaid is no longer active.   Postpartum Care After Vaginal Delivery After you deliver your newborn (postpartum period), the usual stay in the hospital is 24-72 hours. If there were problems with your labor or delivery, or if you have other medical problems, you might be in the hospital longer.  While you are in the hospital, you will receive help and instructions on how to care for yourself and your newborn during the postpartum period.  While you are in the hospital:  Be sure to tell your nurses if you have pain or discomfort, as well as where you feel the pain and what makes the pain worse.  If you had an incision made near your vagina (episiotomy) or if you had some tearing during delivery, the nurses may put ice packs on your episiotomy or tear. The ice packs may help to reduce the pain and swelling.  If you are breastfeeding, you may feel uncomfortable contractions of your uterus for a couple of weeks. This is normal. The contractions help your uterus get back to normal size.  It is normal to have some bleeding after delivery.  For the first 1-3 days after delivery, the flow is red and the amount may be similar to a period.  It is common for the flow to start and stop.  In the first few days, you may pass some small clots. Let your nurses know if you begin to pass large clots or your flow increases.  Do not  flush blood clots down the toilet before having the nurse look at them.  During the next 3-10 days after delivery, your flow should become more watery and pink or brown-tinged in color.  Ten to fourteen days after delivery, your flow should be a small amount of yellowish-white discharge.  The amount of your flow will decrease over the first few weeks after delivery. Your flow may stop in 6-8 weeks. Most women have had their flow stop by 12 weeks after delivery.  You should change your sanitary  pads frequently.  Wash your hands thoroughly with soap and water for at least 20 seconds after changing pads, using the toilet, or before holding or feeding your newborn.  You should feel like you need to empty your bladder within the first 6-8 hours after delivery.  In case you become weak, lightheaded, or faint, call your nurse before you get out of bed for the first time and before you take a shower for the first time.  Within the first few days after delivery, your breasts may begin to feel tender and full. This is called engorgement. Breast tenderness usually goes away within 48-72 hours after engorgement occurs. You may also notice milk leaking from your breasts. If you are not breastfeeding, do not stimulate your breasts. Breast stimulation can make your breasts produce more milk.  Spending as much time as possible with your newborn is very important. During this time, you and your newborn can feel close and get to know each other. Having your newborn stay in your room (rooming in) will help to strengthen the bond with your newborn. It will give you time to get to know your newborn and become comfortable caring for your newborn.  Your hormones change after delivery. Sometimes the hormone changes can temporarily cause you to feel sad or tearful. These feelings should not last more than a few days. If these feelings last longer  than that, you should talk to your caregiver.  If desired, talk to your caregiver about methods of family planning or contraception.  Talk to your caregiver about immunizations. Your caregiver may want you to have the following immunizations before leaving the hospital:  Tetanus, diphtheria, and pertussis (Tdap) or tetanus and diphtheria (Td) immunization. It is very important that you and your family (including grandparents) or others caring for your newborn are up-to-date with the Tdap or Td immunizations. The Tdap or Td immunization can help protect your newborn  from getting ill.  Rubella immunization.  Varicella (chickenpox) immunization.  Influenza immunization. You should receive this annual immunization if you did not receive the immunization during your pregnancy.   This information is not intended to replace advice given to you by your health care provider. Make sure you discuss any questions you have with your health care provider.   Document Released: 11/13/2006 Document Revised: 10/11/2011 Document Reviewed: 09/13/2011 Elsevier Interactive Patient Education [employer]

## 2015-05-22 LAB — TYPE AND SCREEN
ABO/RH(D): B NEG
ANTIBODY SCREEN: POSITIVE
DAT, IGG: NEGATIVE
UNIT DIVISION: 0
Unit division: 0

## 2015-05-24 ENCOUNTER — Ambulatory Visit: Payer: Medicaid Other | Admitting: Obstetrics & Gynecology

## 2015-05-25 ENCOUNTER — Encounter: Payer: Self-pay | Admitting: Women's Health

## 2015-05-25 ENCOUNTER — Ambulatory Visit (INDEPENDENT_AMBULATORY_CARE_PROVIDER_SITE_OTHER): Payer: Medicaid Other | Admitting: Women's Health

## 2015-05-25 VITALS — BP 110/76 | HR 64 | Wt 208.0 lb

## 2015-05-25 DIAGNOSIS — R350 Frequency of micturition: Secondary | ICD-10-CM

## 2015-05-25 LAB — POCT URINALYSIS DIPSTICK
GLUCOSE UA: NEGATIVE
Ketones, UA: NEGATIVE
LEUKOCYTES UA: NEGATIVE
NITRITE UA: NEGATIVE
Protein, UA: NEGATIVE

## 2015-05-25 NOTE — Patient Instructions (Signed)
NO SEX UNTIL AFTER YOU GET YOUR BIRTH CONTROL  

## 2015-05-25 NOTE — Progress Notes (Signed)
Patient ID: Julie Cummings, female   DOB: [birth year], 25 y.o.   MRN: [id number]   [facility]  Patient name: Julie Cummings MRN [id number]  Date of birth: [birth year]  CC & HPI:  Julie Cummings is a 25 y.o. [phone] Caucasian female 6d s/p SVB presenting today for report of urinary frequency on Sunday, none since. No other sx. Bottlefeeding. Lochia normal.  Patient's last menstrual period was 08/12/2014 (exact date).  Pertinent History Reviewed:  Medical & Surgical Hx:   Past medical, surgical, family, and social history reviewed in electronic medical record Medications: Reviewed & Updated - see associated section Allergies: Reviewed in electronic medical record  Objective Findings:  Vitals: BP 110/76 mmHg  Pulse 64  Wt 208 lb (94.348 kg)  LMP 08/12/2014 (Exact Date)  Breastfeeding? No Body mass index is 36.85 kg/(m^2).  Physical Examination: General appearance - alert, well appearing, and in no distress  Results for orders placed or performed in visit on 05/25/15 (from the past 24 hour(s))  POCT urinalysis dipstick   Collection Time: 05/25/15 10:28 AM  Result Value Ref Range   Color, UA     Clarity, UA     Glucose, UA neg    Bilirubin, UA     Ketones, UA neg    Spec Grav, UA     Blood, UA small    pH, UA     Protein, UA neg    Urobilinogen, UA     Nitrite, UA neg    Leukocytes, UA Negative Negative     Assessment & Plan:  A:   6d s/p SVB  Urinary frequency 2d ago, now resolved  P:  Will send urine for cx   Return for As scheduled.  Marge DuncansBooker, Christian Treadway Randall CNM, [facility] 05/25/2015 11:00 AM

## 2015-05-27 LAB — URINE CULTURE

## 2015-05-29 ENCOUNTER — Telehealth: Payer: Self-pay | Admitting: Women's Health

## 2015-05-29 DIAGNOSIS — N390 Urinary tract infection, site not specified: Secondary | ICD-10-CM

## 2015-05-29 NOTE — Telephone Encounter (Signed)
Called pt, notified of +urine cx, states sx returned 4/25 pm after seeing me that day in office. Rx macrobid bid x 7d- called in d/t being unable to escribe.  Cheral MarkerKimberly R. Gwendolyne Welford, CNM, [facility] 05/29/2015 12:23 PM

## 2015-06-23 ENCOUNTER — Ambulatory Visit: Payer: Medicaid Other | Admitting: Advanced Practice Midwife

## 2015-06-24 ENCOUNTER — Ambulatory Visit (INDEPENDENT_AMBULATORY_CARE_PROVIDER_SITE_OTHER): Payer: Medicaid Other | Admitting: Advanced Practice Midwife

## 2015-06-24 MED ORDER — NORETHIN-ETH ESTRAD-FE BIPHAS 1 MG-10 MCG / 10 MCG PO TABS: 1.0000 | ORAL_TABLET | Freq: Every day | ORAL | Status: DC

## 2015-06-24 NOTE — Progress Notes (Signed)
  Julie Cummings is a 25 y.o. who presents for a postpartum visit. She is 5 weeks postpartum following a spontaneous vaginal delivery. I have fully reviewed the prenatal and intrapartum course. The delivery was at 40 gestational weeks.  Anesthesia: epidural. Postpartum course has been uneventful She started zoloft in the hospital d/t hx PPD; Stayed on 25mg  and feels great. Baby's course has been uneventful. Baby is feeding by bottle. Bleeding: started period Tuesday. Bowel function is normal. Bladder function is normal. Patient is not sexually active. Contraception method is OCP (estrogen/progesterone). Postpartum depression screening: negative.   Current outpatient prescriptions:  .  sertraline (ZOLOFT) 25 MG tablet, Take 1 tablet (25 mg total) by mouth daily., Disp: 90 tablet, Rfl: 3 .  albuterol (PROVENTIL HFA;VENTOLIN HFA) 108 (90 Base) MCG/ACT inhaler, Inhale 2 puffs into the lungs every 6 (six) hours as needed for wheezing or shortness of breath. (Patient not taking: Reported on 05/18/2015), Disp: 1 Inhaler, Rfl: 2  Review of Systems   Constitutional: Negative for fever and chills Eyes: Negative for visual disturbances Respiratory: Negative for shortness of breath, dyspnea Cardiovascular: Negative for chest pain or palpitations  Gastrointestinal: Negative for vomiting, diarrhea and constipation Genitourinary: Negative for dysuria and urgency Musculoskeletal: Negative for back pain, joint pain, myalgias  Neurological: Negative for dizziness and headaches   Objective:     Filed Vitals:   06/24/15 1338  BP: 100/60  Pulse: 85   General:  alert, cooperative and no distress   Breasts:  negative  Lungs: clear to auscultation bilaterally  Heart:  regular rate and rhythm  Abdomen: Soft, nontender   Vulva:  normal  Vagina: normal vagina  Cervix:  closed  Corpus: Well involuted     Rectal Exam: no hemorrhoids        Assessment:    normal postpartum exam.  Plan:    1.  Contraception: OCP (estrogen/progesterone)LoLoestrin--start when baby turns 196 weeks old and use BU for 3 weeks.  2. Follow up in:   or as needed.

## 2015-08-04 ENCOUNTER — Other Ambulatory Visit: Payer: Self-pay | Admitting: Women's Health

## 2015-08-25 ENCOUNTER — Other Ambulatory Visit: Payer: Self-pay | Admitting: Advanced Practice Midwife

## 2015-09-02 ENCOUNTER — Encounter: Payer: Self-pay | Admitting: Advanced Practice Midwife

## 2015-09-02 ENCOUNTER — Other Ambulatory Visit (HOSPITAL_COMMUNITY)
Admission: RE | Admit: 2015-09-02 | Discharge: 2015-09-02 | Disposition: A | Discharge status: Home / Self Care | Payer: Medicaid Other | Source: Ambulatory Visit | Attending: Advanced Practice Midwife | Admitting: Advanced Practice Midwife

## 2015-09-02 ENCOUNTER — Ambulatory Visit (INDEPENDENT_AMBULATORY_CARE_PROVIDER_SITE_OTHER): Payer: Medicaid Other | Admitting: Advanced Practice Midwife

## 2015-09-02 VITALS — BP 120/70 | HR 78 | Ht 64.0 in | Wt 188.5 lb

## 2015-09-02 DIAGNOSIS — Z3009 Encounter for other general counseling and advice on contraception: Secondary | ICD-10-CM

## 2015-09-02 DIAGNOSIS — Z308 Encounter for other contraceptive management: Secondary | ICD-10-CM | POA: Diagnosis not present

## 2015-09-02 DIAGNOSIS — Z01419 Encounter for gynecological examination (general) (routine) without abnormal findings: Secondary | ICD-10-CM | POA: Insufficient documentation

## 2015-09-02 DIAGNOSIS — Z113 Encounter for screening for infections with a predominantly sexual mode of transmission: Secondary | ICD-10-CM | POA: Insufficient documentation

## 2015-09-02 NOTE — Progress Notes (Signed)
Julie Cummings 25 y.o.  Vitals:   09/02/15 1513  BP: 120/70  Pulse: 78     Filed Weights   09/02/15 1513  Weight: 188 lb 8 oz (85.5 kg)    Past Medical History: Past Medical History:  Diagnosis Date  . Gall stones   . GERD (gastroesophageal reflux disease)   . History of UTI   . No pertinent past medical history     Past Surgical History: Past Surgical History:  Procedure Laterality Date  . CHOLECYSTECTOMY  12/2010  . ERCP    . TONSILLECTOMY      Family History: Family History  Problem Relation Age of Onset  . Hypertension Sister   . CAD Maternal Grandfather   . Diabetes Maternal Grandfather   . Stroke Maternal Grandfather   . Heart attack Maternal Grandfather   . Heart disease Maternal Grandfather   . Diabetes Paternal Grandfather   . Heart disease Paternal Grandfather   . Stroke Paternal Grandfather   . Heart attack Paternal Grandfather   . Diabetes Mother   . Thyroid disease Maternal Grandmother   . Hypertension Other     grandparents    Social History: Social History  Substance Use Topics  . Smoking status: Former Smoker    Years: 0.50    Types: Cigarettes  . Smokeless tobacco: Never Used     Comment: quit in 2011  . Alcohol use No    Allergies:  Allergies  Allergen Reactions  . Morphine And Related Other (See Comments)    Racing heart; when patient had gallbladder taken out in December 2012, MD told patient that her heart was racing and not to take Morphine again.      Current Outpatient Prescriptions:  .  Norethindrone-Ethinyl Estradiol-Fe Biphas (LO LOESTRIN FE) 1 MG-10 MCG / 10 MCG tablet, Take 1 tablet by mouth daily., Disp: 1 Package, Rfl: 11 .  sertraline (ZOLOFT) 25 MG tablet, Take 1 tablet (25 mg total) by mouth daily., Disp: 90 tablet, Rfl: 3  History of Present Illness: here for Family Planning pap. Has been on COCs for 2 months, doing well.  Feels much better on SSRI, wants to continue.    Review of Systems   Patient  denies any headaches, blurred vision, shortness of breath, chest pain, abdominal pain, problems with bowel movements, urination, or intercourse.   Physical Exam: General:  Well developed, well nourished, no acute distress Skin:  Warm and dry Neck:  Midline trachea, normal thyroid Lungs; Clear to auscultation bilaterally Breast:  No dominant palpable mass, retraction, or nipple discharge Cardiovascular: Regular rate and rhythm Abdomen:  Soft, non tender, no hepatosplenomegaly Pelvic:  External genitalia is normal in appearance.  The vagina is normal in appearance.  The cervix is bulbous.  Uterus is felt to be normal size, shape, and contour.  No adnexal masses or tenderness noted.  Extremities:  No swelling or varicosities noted Psych:  No mood changes.     Impression: Normal GYN exam     Plan: if pap normal, repeat 3 years

## 2015-09-06 LAB — CYTOLOGY - PAP

## 2016-04-12 ENCOUNTER — Other Ambulatory Visit: Payer: Self-pay | Admitting: *Deleted

## 2016-04-12 ENCOUNTER — Telehealth: Payer: Self-pay | Admitting: *Deleted

## 2016-04-12 MED ORDER — SERTRALINE HCL 25 MG PO TABS: 25.0000 mg | ORAL_TABLET | Freq: Every day | ORAL | 3 refills | Status: DC

## 2016-04-12 NOTE — Telephone Encounter (Signed)
Requesting meds. Prescriptions filled.

## 2016-05-29 ENCOUNTER — Other Ambulatory Visit: Payer: Self-pay | Admitting: Advanced Practice Midwife

## 2017-04-30 ENCOUNTER — Other Ambulatory Visit: Payer: Self-pay | Admitting: Advanced Practice Midwife

## 2017-05-01 ENCOUNTER — Other Ambulatory Visit: Payer: Self-pay | Admitting: Advanced Practice Midwife

## 2017-06-05 ENCOUNTER — Other Ambulatory Visit: Payer: Self-pay

## 2017-06-05 ENCOUNTER — Encounter: Payer: Self-pay | Admitting: Advanced Practice Midwife

## 2017-06-05 ENCOUNTER — Ambulatory Visit (INDEPENDENT_AMBULATORY_CARE_PROVIDER_SITE_OTHER): Payer: Medicaid Other | Admitting: Advanced Practice Midwife

## 2017-06-05 VITALS — BP 124/76 | HR 73 | Ht 64.0 in | Wt 232.0 lb

## 2017-06-05 DIAGNOSIS — Z309 Encounter for contraceptive management, unspecified: Secondary | ICD-10-CM

## 2017-06-05 DIAGNOSIS — Z3009 Encounter for other general counseling and advice on contraception: Secondary | ICD-10-CM

## 2017-06-05 DIAGNOSIS — Z01419 Encounter for gynecological examination (general) (routine) without abnormal findings: Secondary | ICD-10-CM

## 2017-06-05 MED ORDER — SERTRALINE HCL 25 MG PO TABS: 25.0000 mg | ORAL_TABLET | Freq: Every day | ORAL | 3 refills | Status: DC

## 2017-06-05 NOTE — Progress Notes (Signed)
Julie Cummings 26 y.o.  Vitals:   06/05/17 1002  BP: 124/76  Pulse: 73     Filed Weights   06/05/17 1002  Weight: 232 lb (105.2 kg)    Past Medical History: Past Medical History:  Diagnosis Date  . Gall stones   . GERD (gastroesophageal reflux disease)   . History of UTI   . No pertinent past medical history     Past Surgical History: Past Surgical History:  Procedure Laterality Date  . CHOLECYSTECTOMY  12/2010  . ERCP    . TONSILLECTOMY      Family History: Family History  Problem Relation Age of Onset  . Hypertension Sister   . CAD Maternal Grandfather   . Diabetes Maternal Grandfather   . Stroke Maternal Grandfather   . Heart attack Maternal Grandfather   . Heart disease Maternal Grandfather   . Diabetes Paternal Grandfather   . Heart disease Paternal Grandfather   . Stroke Paternal Grandfather   . Heart attack Paternal Grandfather   . Diabetes Mother   . Thyroid disease Maternal Grandmother   . Hypertension Other        grandparents    Social History: Social History   Tobacco Use  . Smoking status: Former Smoker    Years: 0.50    Types: Cigarettes  . Smokeless tobacco: Never Used  . Tobacco comment: quit in 2011  Substance Use Topics  . Alcohol use: No  . Drug use: No    Allergies:  Allergies  Allergen Reactions  . Morphine And Related Other (See Comments)    Racing heart; when patient had gallbladder taken out in December 2012, MD told patient that her heart was racing and not to take Morphine again.      Current Outpatient Medications:  .  LO LOESTRIN FE 1 MG-10 MCG / 10 MCG tablet, TAKE 1 TABLET BY MOUTH DAILY., Disp: 28 tablet, Rfl: 11 .  sertraline (ZOLOFT) 25 MG tablet, TAKE 1 TABLET (25 MG TOTAL) BY MOUTH DAILY., Disp: 90 tablet, Rfl: 3  History of Present Illness: here for FP physical. Pap 2017 negative.  On Loloestrin. Thinking about BTL, but knows about regret factor. Husband doesn't want to be done.  Discussed Paragard, may  consider. .    Review of Systems   Patient denies any headaches, blurred vision, shortness of breath, chest pain, abdominal pain, problems with bowel movements, urination, or intercourse. Doing very well on zoloft  dosage.    Physical Exam: General:  Well developed, well nourished, no acute distress Skin:  Warm and dry Neck:  Midline trachea, normal thyroid Lungs; Clear to auscultation bilaterally Breast:  No dominant palpable mass, retraction, or nipple discharge Cardiovascular: Regular rate and rhythm Abdomen:  Soft, non tender, no hepatosplenomegaly Pelvic:  External genitalia is normal in appearance.  The vagina is normal in appearance.  The cervix is bulbous.  Uterus is felt to be normal size, shape, and contour.  No adnexal masses or tenderness noted.  Extremities:  No swelling or varicosities noted Psych:  No mood changes.     Impression: normal GYN exam     Plan: pap next summer if this one is normal  Refilled COCs and zoloft 

## 2017-06-05 NOTE — Patient Instructions (Signed)

## 2017-06-07 LAB — GC/CHLAMYDIA PROBE AMP
CHLAMYDIA, DNA PROBE: NEGATIVE
NEISSERIA GONORRHOEAE BY PCR: NEGATIVE

## 2018-04-08 ENCOUNTER — Telehealth: Payer: Self-pay | Admitting: Advanced Practice Midwife

## 2018-04-08 ENCOUNTER — Other Ambulatory Visit: Payer: Self-pay | Admitting: Advanced Practice Midwife

## 2018-04-08 NOTE — Telephone Encounter (Signed)
Patient called stating that she has sent her pharmacy a message so they could send her refill request. Pt states that she would need this sent in ASAP. Pt states that she has run out. Please contact pt

## 2018-04-08 NOTE — Telephone Encounter (Signed)
Pt called requesting refill on LoLoestrin. Pt states that she has used her last refill. Advised that I would send her request to a provider and she could check with her pharmacy later today. Pt verbalized understanding.

## 2018-04-12 ENCOUNTER — Other Ambulatory Visit: Payer: Self-pay | Admitting: Advanced Practice Midwife

## 2018-04-12 ENCOUNTER — Telehealth: Payer: Self-pay | Admitting: Advanced Practice Midwife

## 2018-04-12 MED ORDER — NORETHIN-ETH ESTRAD-FE BIPHAS 1 MG-10 MCG / 10 MCG PO TABS: 1.0000 | ORAL_TABLET | Freq: Every day | ORAL | 11 refills | Status: DC

## 2018-04-12 NOTE — Telephone Encounter (Signed)
Patient called stating that she has spoke with Selena Batten the nurse regarding her refill of her [facility], Pt states that her pharmacy has told her they are awaiting for [name] to fax over the refill request. Please contact pt

## 2018-06-06 ENCOUNTER — Other Ambulatory Visit: Payer: Self-pay | Admitting: Advanced Practice Midwife

## 2018-06-11 NOTE — Telephone Encounter (Signed)
Pt checking status of her medication refill.

## 2018-06-12 NOTE — Telephone Encounter (Signed)
Pt states she only has 2 pills left and is needing a refill as soon as possible. Please call pt when refilled.

## 2018-06-13 ENCOUNTER — Other Ambulatory Visit: Payer: Self-pay | Admitting: Women's Health

## 2018-06-13 ENCOUNTER — Other Ambulatory Visit: Payer: Self-pay | Admitting: *Deleted

## 2018-06-13 ENCOUNTER — Telehealth: Payer: Self-pay | Admitting: *Deleted

## 2018-06-13 MED ORDER — SERTRALINE HCL 25 MG PO TABS: 25.0000 mg | ORAL_TABLET | Freq: Every day | ORAL | 0 refills | Status: DC

## 2018-06-13 NOTE — Telephone Encounter (Signed)
Pt aware med was refilled. Advised to schedule an appt for pap and physical. Pt voiced understanding. JSY

## 2018-06-13 NOTE — Telephone Encounter (Signed)
Pt requesting a refill on Zoloft. Please advise. Thanks!! JSY

## 2018-08-14 ENCOUNTER — Other Ambulatory Visit: Payer: Self-pay

## 2018-08-14 ENCOUNTER — Other Ambulatory Visit (HOSPITAL_COMMUNITY)
Admission: RE | Admit: 2018-08-14 | Discharge: 2018-08-14 | Disposition: A | Discharge status: Home / Self Care | Payer: Medicaid Other | Source: Ambulatory Visit | Attending: Advanced Practice Midwife | Admitting: Advanced Practice Midwife

## 2018-08-14 ENCOUNTER — Encounter: Payer: Self-pay | Admitting: Advanced Practice Midwife

## 2018-08-14 ENCOUNTER — Ambulatory Visit (INDEPENDENT_AMBULATORY_CARE_PROVIDER_SITE_OTHER): Payer: Medicaid Other | Admitting: Advanced Practice Midwife

## 2018-08-14 VITALS — BP 124/76 | HR 78 | Ht 64.0 in | Wt 240.0 lb

## 2018-08-14 DIAGNOSIS — Z124 Encounter for screening for malignant neoplasm of cervix: Secondary | ICD-10-CM

## 2018-08-14 DIAGNOSIS — Z01419 Encounter for gynecological examination (general) (routine) without abnormal findings: Secondary | ICD-10-CM

## 2018-08-14 DIAGNOSIS — Z0000 Encounter for general adult medical examination without abnormal findings: Secondary | ICD-10-CM

## 2018-08-14 MED ORDER — SERTRALINE HCL 25 MG PO TABS: 25.0000 mg | ORAL_TABLET | Freq: Every day | ORAL | 4 refills | Status: DC

## 2018-08-14 NOTE — Progress Notes (Signed)
Julie Cummings 28 y.o.  Vitals:   08/14/18 0909  BP: 124/76  Pulse: 78     Filed Weights   08/14/18 0909  Weight: 240 lb (108.9 kg)    Past Medical History: Past Medical History:  Diagnosis Date  . Gall stones   . GERD (gastroesophageal reflux disease)   . History of UTI   . No pertinent past medical history     Past Surgical History: Past Surgical History:  Procedure Laterality Date  . CHOLECYSTECTOMY  12/2010  . ERCP    . TONSILLECTOMY      Family History: Family History  Problem Relation Age of Onset  . Hypertension Sister   . CAD Maternal Grandfather   . Diabetes Maternal Grandfather   . Stroke Maternal Grandfather   . Heart attack Maternal Grandfather   . Heart disease Maternal Grandfather   . Diabetes Paternal Grandfather   . Heart disease Paternal Grandfather   . Stroke Paternal Grandfather   . Heart attack Paternal Grandfather   . Diabetes Mother   . Thyroid disease Maternal Grandmother   . Hypertension Other        grandparents    Social History: Social History   Tobacco Use  . Smoking status: Former Smoker    Years: 0.50    Types: Cigarettes  . Smokeless tobacco: Never Used  . Tobacco comment: quit in 2011  Substance Use Topics  . Alcohol use: No  . Drug use: No    Allergies:  Allergies  Allergen Reactions  . Morphine And Related Other (See Comments)    Racing heart; when patient had gallbladder taken out in December 2012, MD told patient that her heart was racing and not to take Morphine again.      Current Outpatient Medications:  .  Norethindrone-Ethinyl Estradiol-Fe Biphas (LO LOESTRIN FE) 1 MG-10 MCG / 10 MCG tablet, Take 1 tablet by mouth daily., Disp: 28 tablet, Rfl: 11 .  sertraline (ZOLOFT) 25 MG tablet, Take 1 tablet (25 mg total) by mouth daily., Disp: 90 tablet, Rfl: 4  History of Present Illness: here for pap and physical.  Last pap 2017, normal.  On COCs, only spots, takes same time daily w/alarm.  Also on zoloft,  doing fine w/it. Wants to continue w/both   Review of Systems   Patient denies any headaches, blurred vision, shortness of breath, chest pain, abdominal pain, problems with bowel movements, urination, or intercourse.   Physical Exam: General:  Well developed, well nourished, no acute distress Skin:  Warm and dry Neck:  Midline trachea, normal thyroid Lungs; Clear to auscultation bilaterally Breast:  No dominant palpable mass, retraction, or nipple discharge Cardiovascular: Regular rate and rhythm Abdomen:  Soft, non tender, no hepatosplenomegaly Pelvic:  External genitalia is normal in appearance.  The vagina is normal in appearance.  The cervix is bulbous.  Uterus is felt to be normal size, shape, and contour.  No adnexal masses or tenderness noted.  Extremities:  No swelling or varicosities noted Psych:  No mood changes.     Impression: normal GYN exam     Plan: if pap nl, repeat q 3 years Continue COCs/zoloft

## 2018-08-15 LAB — CYTOLOGY - PAP
Chlamydia: NEGATIVE
Diagnosis: NEGATIVE
Neisseria Gonorrhea: NEGATIVE

## 2019-03-13 ENCOUNTER — Ambulatory Visit (INDEPENDENT_AMBULATORY_CARE_PROVIDER_SITE_OTHER): Payer: Self-pay | Admitting: Obstetrics and Gynecology

## 2019-03-13 ENCOUNTER — Encounter: Payer: Self-pay | Admitting: Obstetrics and Gynecology

## 2019-03-13 ENCOUNTER — Other Ambulatory Visit: Payer: Self-pay

## 2019-03-13 VITALS — BP 143/98 | HR 118 | Ht 64.0 in | Wt 247.6 lb

## 2019-03-13 DIAGNOSIS — N816 Rectocele: Secondary | ICD-10-CM

## 2019-03-13 NOTE — Progress Notes (Signed)
Patient ID: Julie Cummings, female   DOB: [birth year], 29 y.o.   MRN: [id number]    [facility] Clinic Visit  @DATE @            Patient name: Julie Cummings MRN Julie Cummings  Date of birth: Jul 01, 1990  CC & HPI:  Julie Cummings is a 29 y.o. female presenting today for concerns of cystocele.She denies any issues with bowel movements she denies urinary incontinence.  She is seeing visual differences at the introitus, sees more nonpigmented tissues and is concerned.  She searched [employer].  ROS:  ROS -stress incontinenece  Pertinent History Reviewed:   Reviewed: Medical         Past Medical History:  Diagnosis Date  . Gall stones   . GERD (gastroesophageal reflux disease)   . History of UTI   . No pertinent past medical history                               Surgical Hx:    Past Surgical History:  Procedure Laterality Date  . CHOLECYSTECTOMY  12/2010  . ERCP    . TONSILLECTOMY     Medications: Reviewed & Updated - see associated section                       Current Outpatient Medications:  .  Norethindrone-Ethinyl Estradiol-Fe Biphas (LO LOESTRIN FE) 1 MG-10 MCG / 10 MCG tablet, Take 1 tablet by mouth daily., Disp: 28 tablet, Rfl: 11 .  sertraline (ZOLOFT) 25 MG tablet, Take 1 tablet (25 mg total) by mouth daily., Disp: 90 tablet, Rfl: 4   Social History: Reviewed -  reports that she has quit smoking. Her smoking use included cigarettes. She quit after 0.50 years of use. She has never used smokeless tobacco.  Objective Findings:  Vitals: There were no vitals taken for this visit.  PHYSICAL EXAMINATION General appearance - alert, well appearing, and in no distress Mental status - normal mood, behavior, speech, dress, motor activity, and thought processes, affect appropriate to mood  PELVIC Vagina - multiparous introitus with elasticity, lax introitus multiparous with Valsalva there is some rotation of the rectocele forward but it does not reach the hymen ring Cervix -  normal adequate support Uterus - normal Rectal - rectocele on digital rectal exam there is elasticity, but the pouch does not quite reach 90 degrees of forward rotation of the examining finger  Assessment & Plan:   A:  1.  Mild rectocele  P:  1.  Counseling of rectocele.  Patient encouraged to do the following: Kegel exercises, weight loss of 10%, review medical explained her information brochure on rectocele , Avoid constipation, monitor symptoms for now.  If splinting becomes required for defecation or loss of sensation with intimacy due to laxity becomes a concern, may consider posterior repair, and rectocele repair   By signing my name below, I, 01/2011, attest that this documentation has been prepared under the direction and in the presence of Arnette Norris, MD. Electronically Signed: Tilda Burrow Medical Scribe. 03/13/19. 1:33 PM.  I personally performed the services described in this documentation, which was SCRIBED in my presence. The recorded information has been reviewed and considered accurate. It has been edited as necessary during review. 05/11/19, MD

## 2019-03-13 NOTE — Patient Instructions (Signed)
Pelvic relaxation    About Rectocele  Overview  A rectocele is a type of hernia which causes different degrees of bulging of the rectal tissues into the vaginal wall.  You may even notice that it presses against the vaginal wall so much that some vaginal tissues droop outside of the opening of your vagina.  Causes of Rectocele  The most common cause is childbirth.  The muscles and ligaments in the pelvis that hold up and support the female organs and vagina become stretched and weakened during labor and delivery.  The more babies you have, the more the support tissues are stretched and weakened.  Not everyone who has a baby will develop a rectocele.  Some women have stronger supporting tissue in the pelvis and may not have as much of a problem as others.  Women who have a Cesarean section usually do not get rectocele's unless they pushed a long time prior to the cesarean delivery.  Other conditions that can cause a rectocele include chronic constipation, a chronic cough, a lot of heavy lifting, and obesity.  Older women may have this problem because the loss of female hormones causes the vaginal tissue to become weaker.  Symptoms  There may not be any symptoms.  If you do have symptoms, they may include:  Pelvic pressure in the rectal area  Protrusion of the lower part of the vagina through the opening of the vagina  Constipation and trapping of the stool, making it difficult to have a bowel movement.  In severe cases, you may have to press on the lower part of your vagina to help push the stool out of you rectum.  This is called splinting to empty.  Diagnosing Rectocele  Your health care provider will ask about your symptoms and perform a pelvic exam.  S/he will ask you to bear down, pushing like you are having a bowel movement so as to see how far the lower part of the vagina protrudes into the vagina and possible outside of the vagina.  Your provider will also ask you to contract the  muscles of your pelvis (like you are stopping the stream in the middle of urinating) to determine the strength of your pelvic muscles.  Your provider may also do a rectal exam.  Treatment Options  If you do not have any symptoms, no treatment may be necessary.  Other treatment options include:  Pelvic floor exercises: Contracting the muscles in your genital area may help strengthen your muscles and support the organs.  Be sure to get proper exercise instruction from you physical therapist.  A pessary (removealbe pelvic support device) sometimes helps rectocele symptoms.  Surgery: Surgical repair may be necessary. In some cases the uterus may need to be taken out ( a hysterectomy) as well.  There are many types of surgery for pelvic support problems.  Look for physicians who specialize in repair procedures.  You can take care of yourself by:  Treating and preventing constipation  Avoiding heavy lifting, and lifting correctly (with your legs, not with you waist or back)  Treating a chronic cough or bronchitis  Not smoking  avoiding too much weight gain  Doing pelvic floor exercises   2007, [employer] Doc.33

## 2019-03-15 ENCOUNTER — Other Ambulatory Visit: Payer: Self-pay | Admitting: Advanced Practice Midwife

## 2019-03-19 ENCOUNTER — Other Ambulatory Visit: Payer: Self-pay | Admitting: *Deleted

## 2019-03-19 MED ORDER — LO LOESTRIN FE 1 MG-10 MCG / 10 MCG PO TABS: 1.0000 | ORAL_TABLET | Freq: Every day | ORAL | 6 refills | Status: DC

## 2019-09-07 ENCOUNTER — Other Ambulatory Visit: Payer: Self-pay | Admitting: Advanced Practice Midwife

## 2019-09-26 ENCOUNTER — Telehealth: Payer: Self-pay | Admitting: Advanced Practice Midwife

## 2019-09-26 NOTE — Telephone Encounter (Signed)
Patient called stating that she has spoken to her pharmacy and they stated they have sent over a refill request for her [facility]. Pt states they told her that they have not heard from [name]. Pt states that she only has a little bit left in her pack. Please contact pt

## 2019-09-26 NOTE — Telephone Encounter (Addendum)
Left message letting pt know in Feb, birth control was sent in with 11 refills. I called [facility] in [place]. They never received the prescription with 11 refills so I gave a verbal. Pt should be able to pick up later today. JSY

## 2020-02-03 ENCOUNTER — Other Ambulatory Visit: Payer: Self-pay

## 2020-02-04 ENCOUNTER — Encounter: Payer: Self-pay | Admitting: Family Medicine

## 2020-02-04 ENCOUNTER — Ambulatory Visit (INDEPENDENT_AMBULATORY_CARE_PROVIDER_SITE_OTHER): Payer: BC Managed Care – PPO | Admitting: Family Medicine

## 2020-02-04 VITALS — BP 128/77 | HR 97 | Temp 98.5°F | Ht 64.5 in | Wt 253.0 lb

## 2020-02-04 DIAGNOSIS — F419 Anxiety disorder, unspecified: Secondary | ICD-10-CM | POA: Insufficient documentation

## 2020-02-04 DIAGNOSIS — R002 Palpitations: Secondary | ICD-10-CM | POA: Insufficient documentation

## 2020-02-04 DIAGNOSIS — F329 Major depressive disorder, single episode, unspecified: Secondary | ICD-10-CM

## 2020-02-04 DIAGNOSIS — Z7689 Persons encountering health services in other specified circumstances: Secondary | ICD-10-CM

## 2020-02-04 DIAGNOSIS — K219 Gastro-esophageal reflux disease without esophagitis: Secondary | ICD-10-CM | POA: Insufficient documentation

## 2020-02-04 DIAGNOSIS — J45990 Exercise induced bronchospasm: Secondary | ICD-10-CM | POA: Insufficient documentation

## 2020-02-04 DIAGNOSIS — F32A Depression, unspecified: Secondary | ICD-10-CM | POA: Insufficient documentation

## 2020-02-04 HISTORY — DX: Palpitations: R00.2

## 2020-02-04 MED ORDER — ESCITALOPRAM OXALATE 10 MG PO TABS: 10.0000 mg | ORAL_TABLET | Freq: Every day | ORAL | 0 refills | Status: DC

## 2020-02-04 MED ORDER — ALBUTEROL SULFATE HFA 108 (90 BASE) MCG/ACT IN AERS: INHALATION_SPRAY | RESPIRATORY_TRACT | 5 refills | Status: DC

## 2020-02-04 NOTE — Patient Instructions (Addendum)
Stop zoloft and start lexapro daily Next appt physical 4-6 weeks  Please help [name] help you:  We are honored you have chosen [facility] for your Primary Care home. Below you will find basic instructions that you may need to access in the future. Please help [name] help you by reading the instructions, which cover many of the frequent questions we experience.   Prescription refills and request:  -In order to allow more efficient response time, please call your pharmacy for all refills. They will forward the request electronically to [name]. This allows for the quickest possible response. Request left on a nurse line can take longer to refill, since these are checked as time allows between office patients and other phone calls.  - refill request can take up to 3-5 working days to complete.  - If request is sent electronically and request is appropiate, it is usually completed in 1-2 business days.  - all patients will need to be seen routinely for all chronic medical conditions requiring prescription medications (see follow-up below). If you are overdue for follow up on your condition, you will be asked to make an appointment and we will call in enough medication to cover you until your appointment (up to 30 days).  - all controlled substances will require a face to face visit to request/refill.  - if you desire your prescriptions to go through a new pharmacy, and have an active script at original pharmacy, you will need to call your pharmacy and have scripts transferred to new pharmacy. This is completed between the pharmacy locations and not by your provider.    Results: Our office handles many outgoing and incoming calls daily. If we have not contacted you within 1 week about your results, please check your mychart to see if there is a message first and if not, then contact our office.  In helping with this matter, you help decrease call volume, and therefore allow [name] to be able to respond to patients  needs more efficiently.  We will always attempt to call you with results,  normal or abnormal. However, if we are unable to reach you we will send a message in your my chart with results.   Acute office visits (sick visit):  An acute visit is intended for a new problem and are scheduled in shorter time slots to allow schedule openings for patients with new problems. This is the appropriate visit to discuss a new problem. Problems will not be addressed by phone call or Echart message. Appointment is needed if requesting treatment. In order to provide you with excellent quality medical care with proper time for you to explain your problem, have an exam and receive treatment with instructions, these appointments should be limited to one new problem per visit. If you experience a new problem, in which you desire to be addressed, please make an acute office visit, we save openings on the schedule to accommodate you. Please do not save your new problem for any other type of visit, let us take care of it properly and quickly for you.   Follow up visits:  Depending on your condition(s) your provider will need to see you routinely in order to provide you with quality care and prescribe medication(s). Most chronic conditions (Example: hypertension, Diabetes, depression/anxiety... etc), require visits a couple times a year. Your provider will instruct you on proper follow up for your personal medical conditions and history. Please make certain to make follow up appointments for your condition  as instructed. Failing to do so could result in lapse in your medication treatment/refills. If you request a refill, and are overdue to be seen on a condition, we will always provide you with a 30 day script (once) to allow you time to schedule.    Medicare wellness (well visit): - we have a wonderful Nurse Selena Batten), that will meet with you and provide you will yearly medicare wellness visits. These visits should occur yearly (can  not be scheduled less than 1 calendar year apart) and cover preventive health, immunizations, advance directives and screenings you are entitled to yearly through your medicare benefits. Do not miss out on your entitled benefits, this is when medicare will pay for these benefits to be ordered for you.  These are strongly encouraged by your provider and is the appropriate type of visit to make certain you are up to date with all preventive health benefits. If you have not had your medicare wellness exam in the last 12 months, please make certain to schedule one by calling the office and schedule your medicare wellness with Selena Batten as soon as possible.   Yearly physical (well visit):  - Adults are recommended to be seen yearly for physicals. Check with your insurance and date of your last physical, most insurances require one calendar year between physicals. Physicals include all preventive health topics, screenings, medical exam and labs that are appropriate for gender/age and history. You may have fasting labs needed at this visit. This is a well visit (not a sick visit), new problems should not be covered during this visit (see acute visit).  - Pediatric patients are seen more frequently when they are younger. Your provider will advise you on well child visit timing that is appropriate for your their age. - This is not a medicare wellness visit. Medicare wellness exams do not have an exam portion to the visit. Some medicare companies allow for a physical, some do not allow a yearly physical. If your medicare allows a yearly physical you can schedule the medicare wellness with our nurse Selena Batten and have your physical with your provider after, on the same day. Please check with insurance for your full benefits.   Late Policy/No Shows:  - all new patients should arrive 15-30 minutes earlier than appointment to allow [name] time  to  obtain all personal demographics,  insurance information and for you to complete office  paperwork. - All established patients should arrive 10-15 minutes earlier than appointment time to update all information and be checked in .  - In our best efforts to run on time, if you are late for your appointment you will be asked to either reschedule or if able, we will work you back into the schedule. There will be a wait time to work you back in the schedule,  depending on availability.  - If you are unable to make it to your appointment as scheduled, please call 24 hours ahead of time to allow [name] to fill the time slot with someone else who needs to be seen. If you do not cancel your appointment ahead of time, you may be charged a no show fee.

## 2020-02-04 NOTE — Progress Notes (Signed)
Patient ID: Julie Cummings, female  DOB: Jan 30, 1991, 30 y.o.   MRN: [id number] Patient Care Team    Relationship Specialty Notifications Start End  Ma Hillock, DO PCP - General Family Medicine  02/04/20   Christin Fudge, CNM Midwife Certified Nurse Midwife  02/04/20    Comment: ob/gyn- family tree    Chief Complaint  Patient presents with  . Establish Care    [facility]; asthma     Subjective: Julie Cummings is a 30 y.o.  female present for new patient establishment. All past medical history, surgical history, allergies, family history, immunizations, medications and social history were updated in the electronic medical record today. All recent labs, ED visits and hospitalizations within the last year were reviewed.  Asthma  Wheezes when she has a cold. Exercise induced also.  Uses albuterol inhaler as needed.  anxiety/palpitations/weight gain: Patient reports she has been on very low-dose Zoloft 25 mg every other day for about 5 years.   She says she feels more anxious and has anxiety surrounding her health.  She feels she has gained weight.  There is thyroid disease in her family.  She endorses occasional palpitations.   Depression screen [facility] 2/9 02/04/2020 06/05/2017  Decreased Interest 0 0  Down, Depressed, Hopeless 0 0  PHQ - 2 Score 0 0   No flowsheet data found.     Fall Risk  03/13/2019 08/14/2018  Falls in the past year? 0 0  Number falls in past yr: - 0  Injury with Fall? - 0   Immunization History  Administered Date(s) Administered  . Influenza,inj,Quad PF,6+ Mos 11/05/2012  . MMR 12/02/2010  . PPD Test 09/14/2019  . Rho (D) Immune Globulin 12/02/2010, 09/24/2012, 11/19/2012  . Tdap 12/02/2010, 11/20/2012, 05/20/2015    No exam data present  Past Medical History:  Diagnosis Date  . Allergy   . Chronic headaches   . Depression   . Gall stones   . GERD (gastroesophageal reflux disease)   . History of UTI   . Preeclampsia   . Rh negative state  in antepartum period 10/07/2014   B-   Rhogam after 28wks: 04/27/2015     Allergies  Allergen Reactions  . Morphine And Related Other (See Comments)    Racing heart; when patient had gallbladder taken out in December 2012, MD told patient that her heart was racing and not to take Morphine again.  Marland Kitchen Morphine Palpitations   Past Surgical History:  Procedure Laterality Date  . CHOLECYSTECTOMY  12/2010  . ERCP    . TONSILLECTOMY  1977   Family History  Problem Relation Age of Onset  . Hypertension Sister   . Arthritis Sister   . Depression Sister   . Miscarriages / Stillbirths Sister   . CAD Maternal Grandfather   . Diabetes Maternal Grandfather   . Stroke Maternal Grandfather   . Heart attack Maternal Grandfather   . Heart disease Maternal Grandfather   . Hearing loss Maternal Grandfather   . Hyperlipidemia Maternal Grandfather   . Hypertension Maternal Grandfather   . Diabetes Paternal Grandfather   . Heart disease Paternal Grandfather   . Stroke Paternal Grandfather   . Heart attack Paternal Grandfather   . Diabetes Mother   . Arthritis Mother   . Hearing loss Mother   . Hypertension Mother   . Diabetes Father   . Thyroid disease Maternal Grandmother   . Heart disease Paternal Grandmother   . Miscarriages / Stillbirths Sister   .  Hypertension Other        grandparents  . Asthma Son   . Asthma Son    Social History   Social History Narrative   Marital status/children/pets: Married, 3 children   Education/employment: Secretary/administrator, Manufacturing engineer:      - wears seatbelt: Yes     - Feels safe in their relationships: Yes    Allergies as of 02/04/2020      Reactions   Morphine And Related Other (See Comments)   Racing heart; when patient had gallbladder taken out in December 2012, MD told patient that her heart was racing and not to take Morphine again.   Morphine Palpitations      Medication List       Accurate as of February 04, 2020 11:59 PM. If you  have any questions, ask your nurse or doctor.        STOP taking these medications   ALBUTEROL IN Stopped by: Howard Pouch, DO   sertraline 25 MG tablet Commonly known as: ZOLOFT Stopped by: Howard Pouch, DO     TAKE these medications   albuterol 108 (90 Base) MCG/ACT inhaler Commonly known as: VENTOLIN HFA 15-20 minutes prior to exercise. Started by: Howard Pouch, DO   escitalopram 10 MG tablet Commonly known as: Lexapro Take 1 tablet (10 mg total) by mouth daily. Started by: Howard Pouch, DO   Lo Loestrin Fe 1 MG-10 MCG / 10 MCG tablet Generic drug: Norethindrone-Ethinyl Estradiol-Fe Biphas Take 1 tablet by mouth daily. What changed: Another medication with the same name was removed. Continue taking this medication, and follow the directions you see here. Changed by: Howard Pouch, DO       All past medical history, surgical history, allergies, family history, immunizations andmedications were updated in the EMR today and reviewed under the history and medication portions of their EMR.    No results found for this or any previous visit (from the past 2160 hour(s)).  No results found.   ROS: 14 pt review of systems performed and negative (unless mentioned in an HPI)  Objective: BP 128/77   Pulse 97   Temp 98.5 F (36.9 C) (Oral)   Ht 5' 4.5" (1.638 m)   Wt 253 lb (114.8 kg)   SpO2 98%   BMI 42.76 kg/m  Gen: Afebrile. No acute distress. Nontoxic in appearance, well-developed, well-nourished,  Pleasant obese female.  HENT: AT. [place].  No cough.  No hoarseness on exam. Eyes:Pupils Equal Round Reactive to light, Extraocular movements intact,  Conjunctiva without redness, discharge or icterus. Neck/lymp/endocrine: Supple,no lymphadenopathy, no thyromegaly CV: RRR no murmur, no edema, +2/4 P posterior tibialis pulses.  Chest: CTAB, no wheeze, rhonchi or crackles. normal Respiratory effort. good Air movement. Skin: Warm and well-perfused. Skin intact. Neuro/Msk:Normal  gait. PERLA. EOMi. Alert. Oriented x3.  Cranial nerves II through XII intact. Muscle strength 5/5 upper/lower extremity. DTRs equal bilaterally. Psych: Normal affect, dress and demeanor. Normal speech. Normal thought content and judgment.   Assessment/plan: BECKHAM BUXBAUM is a 30 y.o. female present for CPE/EST Establishing care with new doctor, encounter for Exercise-induced asthma Stable continue albuterol inhaler as needed  Anxiety/additions Discussed different options with her today.  She is having increased anxiety. Discontinue Zoloft Start Lexapro 10 mg daily. - TSH - T4, free - T3, free  Return in about 4 weeks (around 03/03/2020).  For [place]  Orders Placed This Encounter  Procedures  . TSH  . T4, free  . T3, free  Meds ordered this encounter  Medications  . albuterol (VENTOLIN HFA) 108 (90 Base) MCG/ACT inhaler    Sig: 15-20 minutes prior to exercise.    Dispense:  8 g    Refill:  5  . escitalopram (LEXAPRO) 10 MG tablet    Sig: Take 1 tablet (10 mg total) by mouth daily.    Dispense:  90 tablet    Refill:  0   Referral Orders  No referral(s) requested today     Note is dictated utilizing voice recognition software. Although note has been proof read prior to signing, occasional typographical errors still can be missed. If any questions arise, please do not hesitate to call for verification.  Electronically signed by: Howard Pouch, DO [place]

## 2020-02-05 ENCOUNTER — Encounter: Payer: Self-pay | Admitting: Family Medicine

## 2020-02-05 LAB — T4, FREE: Free T4: 0.93 ng/dL (ref 0.60–1.60)

## 2020-02-05 LAB — T3, FREE: T3, Free: 3.9 pg/mL (ref 2.3–4.2)

## 2020-02-05 LAB — TSH: TSH: 0.89 u[IU]/mL (ref 0.35–4.50)

## 2020-02-29 ENCOUNTER — Ambulatory Visit
Admission: EM | Admit: 2020-02-29 | Discharge: 2020-02-29 | Disposition: A | Discharge status: Home / Self Care | Payer: BC Managed Care – PPO | Attending: Emergency Medicine | Admitting: Emergency Medicine

## 2020-02-29 ENCOUNTER — Encounter: Payer: Self-pay | Admitting: Emergency Medicine

## 2020-02-29 ENCOUNTER — Other Ambulatory Visit: Payer: Self-pay

## 2020-02-29 DIAGNOSIS — R062 Wheezing: Secondary | ICD-10-CM

## 2020-02-29 DIAGNOSIS — J9801 Acute bronchospasm: Secondary | ICD-10-CM

## 2020-02-29 DIAGNOSIS — J069 Acute upper respiratory infection, unspecified: Secondary | ICD-10-CM

## 2020-02-29 MED ORDER — ALBUTEROL SULFATE HFA 108 (90 BASE) MCG/ACT IN AERS: 1.0000 | INHALATION_SPRAY | Freq: Four times a day (QID) | RESPIRATORY_TRACT | 1 refills | Status: DC | PRN

## 2020-02-29 MED ORDER — BENZONATATE 100 MG PO CAPS: 100.0000 mg | ORAL_CAPSULE | Freq: Three times a day (TID) | ORAL | 0 refills | Status: DC

## 2020-02-29 MED ORDER — PREDNISONE 10 MG (21) PO TBPK: ORAL_TABLET | Freq: Every day | ORAL | 0 refills | Status: DC

## 2020-02-29 NOTE — ED Triage Notes (Signed)
Nasal, sinus congestion and wheezing.  Pt states last time she got like this she needs inhaler and steroids.

## 2020-02-29 NOTE — ED Provider Notes (Signed)
[facility]   [id number] 02/29/20 Arrival Time: 6063   CC: URI  SUBJECTIVE: History from: patient.  Julie Cummings is a 30 y.o. female with history of exercise-induced asthma presented to the urgent care for complaint of nasal congestion, and wheezing for the past few days.  Denies sick exposure to COVID, flu or strep.  Denies recent travel.  Has tried OTC medication without relief.  Denies alleviating or aggravating factors.  Reports similar symptoms in the past that improved with albuterol and prednisone.   Denies fever, chills, fatigue, sinus pain, rhinorrhea, sore throat, SOB, wheezing, chest pain, nausea, changes in bowel or bladder habits.     ROS: As per HPI.  All other pertinent ROS negative.      Past Medical History:  Diagnosis Date  . Allergy   . Chronic headaches   . Depression   . Gall stones   . GERD (gastroesophageal reflux disease)   . History of UTI   . Preeclampsia   . Rh negative state in antepartum period 10/07/2014   B-   Rhogam after 28wks: 04/27/2015     Past Surgical History:  Procedure Laterality Date  . CHOLECYSTECTOMY  12/2010  . ERCP    . TONSILLECTOMY  1977   Allergies  Allergen Reactions  . Morphine And Related Other (See Comments)    Racing heart; when patient had gallbladder taken out in December 2012, MD told patient that her heart was racing and not to take Morphine again.  Marland Kitchen Morphine Palpitations   No current facility-administered medications on file prior to encounter.   Current Outpatient Medications on File Prior to Encounter  Medication Sig Dispense Refill  . escitalopram (LEXAPRO) 10 MG tablet Take 1 tablet (10 mg total) by mouth daily. 90 tablet 0  . Norethindrone-Ethinyl Estradiol-Fe Biphas (LO LOESTRIN FE) 1 MG-10 MCG / 10 MCG tablet Take 1 tablet by mouth daily. 28 tablet 6   Social History   Socioeconomic History  . Marital status: Married    Spouse name: Not on file  . Number of children: Not on file  .  Years of education: Not on file  . Highest education level: Not on file  Occupational History  . Not on file  Tobacco Use  . Smoking status: Former Smoker    Years: 0.50    Types: Cigarettes  . Smokeless tobacco: Never Used  . Tobacco comment: quit in 2011  Vaping Use  . Vaping Use: Never used  Substance and Sexual Activity  . Alcohol use: No  . Drug use: No  . Sexual activity: Yes    Partners: Male    Birth control/protection: Pill  Other Topics Concern  . Not on file  Social History Narrative   Marital status/children/pets: Married, 3 children   Education/employment: Automotive engineer, TEFL teacher:      - wears seatbelt: Yes     - Feels safe in their relationships: Yes   Social Determinants of Corporate investment banker Strain: Not on file  Food Insecurity: Not on file  Transportation Needs: Not on file  Physical Activity: Not on file  Stress: Not on file  Social Connections: Not on file  Intimate Partner Violence: Not on file   Family History  Problem Relation Age of Onset  . Hypertension Sister   . Arthritis Sister   . Depression Sister   . Miscarriages / Stillbirths Sister   . CAD Maternal Grandfather   . Diabetes Maternal Grandfather   .  Stroke Maternal Grandfather   . Heart attack Maternal Grandfather   . Heart disease Maternal Grandfather   . Hearing loss Maternal Grandfather   . Hyperlipidemia Maternal Grandfather   . Hypertension Maternal Grandfather   . Diabetes Paternal Grandfather   . Heart disease Paternal Grandfather   . Stroke Paternal Grandfather   . Heart attack Paternal Grandfather   . Diabetes Mother   . Arthritis Mother   . Hearing loss Mother   . Hypertension Mother   . Diabetes Father   . Thyroid disease Maternal Grandmother   . Heart disease Paternal Grandmother   . Miscarriages / Stillbirths Sister   . Hypertension Other        grandparents  . Asthma Son   . Asthma Son     OBJECTIVE:  Vitals:   02/29/20 1038   BP: 134/88  Pulse: (!) 105  Resp: 19  Temp: 98.3 F (36.8 C)  TempSrc: Oral  SpO2: 96%     General appearance: alert; appears fatigued, but nontoxic; speaking in full sentences and tolerating own secretions HEENT: NCAT; Ears: EACs clear, TMs pearly gray; Eyes: PERRL.  EOM grossly intact. Sinuses: nontender; Nose: nares patent without rhinorrhea, Throat: oropharynx clear, tonsils non erythematous or enlarged, uvula midline  Neck: supple without LAD Lungs: unlabored respirations, wheezing present bilateral lung symmetrical air entry; cough: mild; no respiratory distress; Heart: regular rate and rhythm.  Radial pulses 2+ symmetrical bilaterally Skin: warm and dry Psychological: alert and cooperative; normal mood and affect  LABS:  No results found for this or any previous visit (from the past 24 hour(s)).   ASSESSMENT & PLAN:  1. Wheezing   2. Bronchospasm   3. Acute URI     Meds ordered this encounter  Medications  . albuterol (VENTOLIN HFA) 108 (90 Base) MCG/ACT inhaler    Sig: Inhale 1-2 puffs into the lungs every 6 (six) hours as needed.    Dispense:  18 g    Refill:  1  . predniSONE (STERAPRED UNI-PAK 21 TAB) 10 MG (21) TBPK tablet    Sig: Take by mouth daily. Take 6 tabs by mouth daily  for 1 days, then 5 tabs for 1 days, then 4 tabs for 1 days, then 3 tabs for 1 days, 2 tabs for 1 days, then 1 tab by mouth daily for 1 days    Dispense:  21 tablet    Refill:  0  . benzonatate (TESSALON) 100 MG capsule    Sig: Take 1 capsule (100 mg total) by mouth every 8 (eight) hours.    Dispense:  21 capsule    Refill:  0   Discharge Instructions  Get plenty of rest and push fluids Tessalon Perles prescribed for cough Prednisone was prescribed for wheezing  Albuterol was prescribed Use medications daily for symptom relief Use OTC medications like ibuprofen or tylenol as needed fever or pain Call or go to the ED if you have any new or worsening symptoms such as fever,  worsening cough, shortness of breath, chest tightness, chest pain, turning blue, changes in mental status, etc...   Reviewed expectations re: course of current medical issues. Questions answered. Outlined signs and symptoms indicating need for more acute intervention. Patient verbalized understanding. After Visit Summary given.         Durward Parcel, FNP 02/29/20 1109

## 2020-02-29 NOTE — Discharge Instructions (Addendum)
Get plenty of rest and push fluids Tessalon Perles prescribed for cough Prednisone was prescribed for wheezing  Albuterol was prescribed Use medications daily for symptom relief Use OTC medications like ibuprofen or tylenol as needed fever or pain Call or go to the ED if you have any new or worsening symptoms such as fever, worsening cough, shortness of breath, chest tightness, chest pain, turning blue, changes in mental status, etc..Marland Kitchen

## 2020-03-18 ENCOUNTER — Ambulatory Visit (INDEPENDENT_AMBULATORY_CARE_PROVIDER_SITE_OTHER): Payer: BC Managed Care – PPO | Admitting: Family Medicine

## 2020-03-18 ENCOUNTER — Encounter: Payer: Self-pay | Admitting: Family Medicine

## 2020-03-18 ENCOUNTER — Other Ambulatory Visit: Payer: Self-pay

## 2020-03-18 VITALS — BP 126/89 | HR 85 | Temp 98.1°F | Ht 64.75 in | Wt 255.0 lb

## 2020-03-18 DIAGNOSIS — R002 Palpitations: Secondary | ICD-10-CM | POA: Diagnosis not present

## 2020-03-18 DIAGNOSIS — Z713 Dietary counseling and surveillance: Secondary | ICD-10-CM | POA: Insufficient documentation

## 2020-03-18 DIAGNOSIS — F419 Anxiety disorder, unspecified: Secondary | ICD-10-CM | POA: Diagnosis not present

## 2020-03-18 DIAGNOSIS — Z793 Long term (current) use of hormonal contraceptives: Secondary | ICD-10-CM | POA: Diagnosis not present

## 2020-03-18 DIAGNOSIS — Z1159 Encounter for screening for other viral diseases: Secondary | ICD-10-CM

## 2020-03-18 DIAGNOSIS — Z131 Encounter for screening for diabetes mellitus: Secondary | ICD-10-CM | POA: Diagnosis not present

## 2020-03-18 DIAGNOSIS — Z0001 Encounter for general adult medical examination with abnormal findings: Secondary | ICD-10-CM

## 2020-03-18 DIAGNOSIS — Z8249 Family history of ischemic heart disease and other diseases of the circulatory system: Secondary | ICD-10-CM | POA: Insufficient documentation

## 2020-03-18 DIAGNOSIS — Z0000 Encounter for general adult medical examination without abnormal findings: Secondary | ICD-10-CM | POA: Diagnosis not present

## 2020-03-18 LAB — CBC WITH DIFFERENTIAL/PLATELET
Basophils Absolute: 0.1 10*3/uL (ref 0.0–0.1)
Basophils Relative: 0.8 % (ref 0.0–3.0)
Eosinophils Absolute: 0.4 10*3/uL (ref 0.0–0.7)
Eosinophils Relative: 4.9 % (ref 0.0–5.0)
HCT: 42.1 % (ref 36.0–46.0)
Hemoglobin: 14.2 g/dL (ref 12.0–15.0)
Lymphocytes Relative: 27.6 % (ref 12.0–46.0)
Lymphs Abs: 2.3 10*3/uL (ref 0.7–4.0)
MCHC: 33.7 g/dL (ref 30.0–36.0)
MCV: 89 fl (ref 78.0–100.0)
Monocytes Absolute: 0.4 10*3/uL (ref 0.1–1.0)
Monocytes Relative: 5 % (ref 3.0–12.0)
Neutro Abs: 5.2 10*3/uL (ref 1.4–7.7)
Neutrophils Relative %: 61.7 % (ref 43.0–77.0)
Platelets: 225 10*3/uL (ref 150.0–400.0)
RBC: 4.73 Mil/uL (ref 3.87–5.11)
RDW: 13.6 % (ref 11.5–15.5)
WBC: 8.3 10*3/uL (ref 4.0–10.5)

## 2020-03-18 LAB — COMPREHENSIVE METABOLIC PANEL
ALT: 27 U/L (ref 0–35)
AST: 17 U/L (ref 0–37)
Albumin: 3.9 g/dL (ref 3.5–5.2)
Alkaline Phosphatase: 90 U/L (ref 39–117)
BUN: 11 mg/dL (ref 6–23)
CO2: 28 mEq/L (ref 19–32)
Calcium: 8.9 mg/dL (ref 8.4–10.5)
Chloride: 104 mEq/L (ref 96–112)
Creatinine, Ser: 0.86 mg/dL (ref 0.40–1.20)
GFR: 91.25 mL/min (ref >60.00)
Glucose, Bld: 89 mg/dL (ref 70–99)
Potassium: 4 mEq/L (ref 3.5–5.1)
Sodium: 138 mEq/L (ref 135–145)
Total Bilirubin: 1 mg/dL (ref 0.2–1.2)
Total Protein: 6.3 g/dL (ref 6.0–8.3)

## 2020-03-18 LAB — LIPID PANEL
Cholesterol: 174 mg/dL (ref 0–200)
HDL: 63.1 mg/dL (ref >39.00)
LDL Cholesterol: 94 mg/dL (ref 0–99)
NonHDL: 110.54
Total CHOL/HDL Ratio: 3
Triglycerides: 84 mg/dL (ref 0.0–149.0)
VLDL: 16.8 mg/dL (ref 0.0–40.0)

## 2020-03-18 LAB — HEMOGLOBIN A1C: Hgb A1c MFr Bld: 5.1 % (ref 4.6–6.5)

## 2020-03-18 MED ORDER — ESCITALOPRAM OXALATE 10 MG PO TABS: 10.0000 mg | ORAL_TABLET | Freq: Every day | ORAL | 1 refills | Status: DC

## 2020-03-18 NOTE — Progress Notes (Signed)
 This visit occurred during the SARS-CoV-2 public health emergency.  Safety protocols were in place, including screening questions prior to the visit, additional usage of staff PPE, and extensive cleaning of exam room while observing appropriate contact time as indicated for disinfecting solutions.    Patient ID: Julie Cummings, female  DOB: 02/22/1990, 29 y.o.   MRN: [id number] Patient Care Team    Relationship Specialty Notifications Start End  Kuneff, Renee A, DO PCP - General Family Medicine  02/04/20   Cresenzo-Dishmon, Frances, CNM Midwife Certified Nurse Midwife  02/04/20    Comment: [facility]    Chief Complaint  Patient presents with  . Annual Exam    Pt is fasting;     Subjective: Julie Cummings is a 29 y.o.  Female  present for CPE/CMC. All past medical history, surgical history, allergies, family history, immunizations, medications and social history were updated in the electronic medical record today. All recent labs, ED visits and hospitalizations within the last year were reviewed.  Health maintenance:  Colonoscopy: no fhx. Screen at 45 Mammogram: no fhx. Screen at 40 Cervical cancer screening: last pap: 07/2018, completed by: [facility] Immunizations: tdap UTD 2017, Influenza declined (encouraged yearly),covid series declined Infectious disease screening: HIV completed, Hep C agreeable to testing DEXA:routine screen Assistive device: none Oxygen use:none Patient has a Dental home. Hospitalizations/ED visits: reviewed  Asthma  Uses albuterol inhaler as needed.  anxiety/palpitations/weight gain: She is feeling much better on lexapro 10 mg. Had no problem switching zoloft to lexapro. She also has had a menstrual cycle. She would like to discuss weight loss management  Prior note: Patient reports she has been on very low-dose Zoloft 25 mg every other day for about 5 years.   She says she feels more anxious and has anxiety surrounding her health.  She  feels she has gained weight.  There is thyroid disease in her family.  She endorses occasional palpitations  Depression screen PHQ 2/9 03/18/2020 02/04/2020 06/05/2017  Decreased Interest 0 0 0  Down, Depressed, Hopeless 0 0 0  PHQ - 2 Score 0 0 0  Altered sleeping 1 - -  Tired, decreased energy 1 - -  Change in appetite 0 - -  Feeling bad or failure about yourself  0 - -  Trouble concentrating 0 - -  Moving slowly or fidgety/restless 0 - -  Suicidal thoughts 0 - -  PHQ-9 Score 2 - -   GAD 7 : Generalized Anxiety Score 03/18/2020  Nervous, Anxious, on Edge 1  Control/stop worrying 0  Worry too much - different things 1  Trouble relaxing 0  Restless 0  Easily annoyed or irritable 1     Immunization History  Administered Date(s) Administered  . Influenza,inj,Quad PF,6+ Mos 11/05/2012  . MMR 12/02/2010  . PPD Test 09/14/2019  . Rho (D) Immune Globulin 12/02/2010, 09/24/2012, 11/19/2012  . Tdap 12/02/2010, 11/20/2012, 05/20/2015     Past Medical History:  Diagnosis Date  . Allergy   . Chronic headaches   . Depression   . Gall stones   . GERD (gastroesophageal reflux disease)   . History of UTI   . Preeclampsia   . Rh negative state in antepartum period 10/07/2014   B-   Rhogam after 28wks: 04/27/2015     Allergies  Allergen Reactions  . Morphine And Related Other (See Comments)    Racing heart; when patient had gallbladder taken out in December 2012, MD told patient that her heart was   racing and not to take Morphine again.  Marland Kitchen Morphine Palpitations   Past Surgical History:  Procedure Laterality Date  . CHOLECYSTECTOMY  12/2010  . ERCP    . TONSILLECTOMY  1977   Family History  Problem Relation Age of Onset  . Hypertension Sister   . Arthritis Sister   . Depression Sister   . Miscarriages / Stillbirths Sister   . CAD Maternal Grandfather   . Diabetes Maternal Grandfather   . Stroke Maternal Grandfather   . Heart attack Maternal Grandfather   . Heart disease  Maternal Grandfather   . Hearing loss Maternal Grandfather   . Hyperlipidemia Maternal Grandfather   . Hypertension Maternal Grandfather   . Diabetes Paternal Grandfather   . Heart disease Paternal Grandfather   . Stroke Paternal Grandfather   . Heart attack Paternal Grandfather   . Diabetes Mother   . Arthritis Mother   . Hearing loss Mother   . Hypertension Mother   . Diabetes Father   . Thyroid disease Maternal Grandmother   . Heart disease Paternal Grandmother   . Miscarriages / Stillbirths Sister   . Hypertension Other        grandparents  . Asthma Son   . Asthma Son    Social History   Social History Narrative   Marital status/children/pets: Married, 3 children   Education/employment: Secretary/administrator, Manufacturing engineer:      - wears seatbelt: Yes     - Feels safe in their relationships: Yes    Allergies as of 03/18/2020      Reactions   Morphine And Related Other (See Comments)   Racing heart; when patient had gallbladder taken out in December 2012, MD told patient that her heart was racing and not to take Morphine again.   Morphine Palpitations      Medication List       Accurate as of March 18, 2020  9:48 AM. If you have any questions, ask your nurse or doctor.        STOP taking these medications   benzonatate 100 MG capsule Commonly known as: TESSALON Stopped by: Howard Pouch, DO   predniSONE 10 MG (21) Tbpk tablet Commonly known as: STERAPRED UNI-PAK 21 TAB Stopped by: Howard Pouch, DO     TAKE these medications   albuterol 108 (90 Base) MCG/ACT inhaler Commonly known as: VENTOLIN HFA Inhale 1-2 puffs into the lungs every 6 (six) hours as needed.   escitalopram 10 MG tablet Commonly known as: Lexapro Take 1 tablet (10 mg total) by mouth daily.   Lo Loestrin Fe 1 MG-10 MCG / 10 MCG tablet Generic drug: Norethindrone-Ethinyl Estradiol-Fe Biphas Take 1 tablet by mouth daily.       All past medical history, surgical history,  allergies, family history, immunizations andmedications were updated in the EMR today and reviewed under the history and medication portions of their EMR.      No results found.   ROS: 14 pt review of systems performed and negative (unless mentioned in an HPI)  Objective: BP 126/89   Pulse 85   Temp 98.1 F (36.7 C) (Oral)   Ht 5' 4.75" (1.645 m)   Wt 255 lb (115.7 kg)   SpO2 98%   BMI 42.76 kg/m  Gen: Afebrile. No acute distress. Nontoxic in appearance, well-developed, well-nourished,  Pleasant female, obese.  HENT: AT. [place]. Bilateral TM visualized and normal in appearance, normal external auditory canal. MMM, no oral lesions, adequate dentition. Bilateral nares within  normal limits. Throat without erythema, ulcerations or exudates. no Cough on exam, no hoarseness on exam. Eyes:Pupils Equal Round Reactive to light, Extraocular movements intact,  Conjunctiva without redness, discharge or icterus. Neck/lymp/endocrine: Supple,no lymphadenopathy, no thyromegaly CV: RRR no murmur, no edema, +2/4 P posterior tibialis pulses.  Chest: CTAB, no wheeze, rhonchi or crackles. normal Respiratory effort. good Air movement. Abd: Soft. flat. NTND. BS presentn. no Masses palpated. No hepatosplenomegaly. No rebound tenderness or guarding. Skin: no rashes, purpura or petechiae. Warm and well-perfused. Skin intact. Neuro/Msk: Normal gait. PERLA. EOMi. Alert. Oriented x3.  Cranial nerves II through XII intact. Muscle strength 5/5 upper/lower extremity. DTRs equal bilaterally. Psych: Normal affect, dress and demeanor. Normal speech. Normal thought content and judgment.   No exam data present  Assessment/plan: Julie Cummings is a 29 y.o. female present for CPE/CMC Exercise-induced asthma Stable. continue albuterol inhaler as needed  Anxiety/additions Much improved after starting lexapro- she is more relaxed.  Tried in past: Zoloft Continue Lexapro 10 mg daily. F/u 5.5 mos on  CMC  Palpitations improved - CBC with Differential/Platelet  Long term current use of hormonal contraceptive - CBC with Differential/Platelet - Comprehensive metabolic panel - Lipid panel  Diabetes mellitus screening - Hemoglobin A1c Need for hepatitis C screening test - Hepatitis C Antibody  Weight loss counseling, encounter for/Morbid obesity (HCC)/FHX heart disease/stroke Start weight: 255 lbs.  She was encouraged to make weight loss goals in 4-6 mos increments and final weight loss goal (170).  Patient was counseled on exercise, calorie counting, weight loss -Patient was provided with online resources for: Weekly net calorie calculator.  Applications for calorie counting.  Patient was advised to ensure she is taking in adequate nutrition daily by meeting calorie goals. -Patient was educated on dietary changes to not only lose weight but to eat healthy.  Patient was educated on glycemic index. -Patient was educated on exercise goal of 150 minutes a week (plus warm up and cool down) of cardiovascular exercise.  Patient was educated on heart rate for cardiovascular and fat burning zones. -Patient was encouraged to maintain adequate water consumption of at least 120 ounces a day, more if exercising/sweating. - Lipid panel F/u 2-4 weeks if desired further counseling and weight ins.   Routine general medical examination at a health care facility Patient was encouraged to exercise greater than 150 minutes a week. Patient was encouraged to choose a diet filled with fresh fruits and vegetables, and lean meats. AVS provided to patient today for education/recommendation on gender specific health and safety maintenance. Colonoscopy: no fhx. Screen at 45 Mammogram: no fhx. Screen at 40 Cervical cancer screening: last pap: 07/2018, completed by: [facility] Immunizations: tdap UTD 2017, Influenza declined (encouraged yearly),covid series declined Infectious disease screening: HIV completed,  Hep C agreeable to testing DEXA:routine screen  Return in about 5 months (around 09/01/2020) for CMC (30 min) and 1 yr cpe.   Orders Placed This Encounter  Procedures  . CBC with Differential/Platelet  . Comprehensive metabolic panel  . Hemoglobin A1c  . Lipid panel  . Hepatitis C Antibody   Meds ordered this encounter  Medications  . escitalopram (LEXAPRO) 10 MG tablet    Sig: Take 1 tablet (10 mg total) by mouth daily.    Dispense:  90 tablet    Refill:  1   Referral Orders  No referral(s) requested today     Electronically signed by: Renee Kuneff, DO [facility]  

## 2020-03-18 NOTE — Patient Instructions (Addendum)
Health Maintenance, Female Adopting a healthy lifestyle and getting preventive care are important in promoting health and wellness. Ask your health care provider about:  The right schedule for you to have regular tests and exams.  Things you can do on your own to prevent diseases and keep yourself healthy. What should I know about diet, weight, and exercise? Eat a healthy diet  Eat a diet that includes plenty of vegetables, fruits, low-fat dairy products, and lean protein.  Do not eat a lot of foods that are high in solid fats, added sugars, or sodium.   Maintain a healthy weight Body mass index (BMI) is used to identify weight problems. It estimates body fat based on height and weight. Your health care provider can help determine your BMI and help you achieve or maintain a healthy weight. Get regular exercise Get regular exercise. This is one of the most important things you can do for your health. Most adults should:  Exercise for at least 150 minutes each week. The exercise should increase your heart rate and make you sweat (moderate-intensity exercise).  Do strengthening exercises at least twice a week. This is in addition to the moderate-intensity exercise.  Spend less time sitting. Even light physical activity can be beneficial. Watch cholesterol and blood lipids Have your blood tested for lipids and cholesterol at 30 years of age, then have this test every 5 years. Have your cholesterol levels checked more often if:  Your lipid or cholesterol levels are high.  You are older than 30 years of age.  You are at high risk for heart disease. What should I know about cancer screening? Depending on your health history and family history, you may need to have cancer screening at various ages. This may include screening for:  Breast cancer.  Cervical cancer.  Colorectal cancer.  Skin cancer.  Lung cancer. What should I know about heart disease, diabetes, and high blood  pressure? Blood pressure and heart disease  High blood pressure causes heart disease and increases the risk of stroke. This is more likely to develop in people who have high blood pressure readings, are of African descent, or are overweight.  Have your blood pressure checked: ? Every 3-5 years if you are 18-39 years of age. ? Every year if you are 40 years old or older. Diabetes Have regular diabetes screenings. This checks your fasting blood sugar level. Have the screening done:  Once every three years after age 40 if you are at a normal weight and have a low risk for diabetes.  More often and at a younger age if you are overweight or have a high risk for diabetes. What should I know about preventing infection? Hepatitis B If you have a higher risk for hepatitis B, you should be screened for this virus. Talk with your health care provider to find out if you are at risk for hepatitis B infection. Hepatitis C Testing is recommended for:  Everyone born from 1945 through 1965.  Anyone with known risk factors for hepatitis C. Sexually transmitted infections (STIs)  Get screened for STIs, including gonorrhea and chlamydia, if: ? You are sexually active and are younger than 30 years of age. ? You are older than 30 years of age and your health care provider tells you that you are at risk for this type of infection. ? Your sexual activity has changed since you were last screened, and you are at increased risk for chlamydia or gonorrhea. Ask your health care provider   if you are at risk.  Ask your health care provider about whether you are at high risk for HIV. Your health care provider may recommend a prescription medicine to help prevent HIV infection. If you choose to take medicine to prevent HIV, you should first get tested for HIV. You should then be tested every 3 months for as long as you are taking the medicine. Pregnancy  If you are about to stop having your period (premenopausal) and  you may become pregnant, seek counseling before you get pregnant.  Take 400 to 800 micrograms (mcg) of folic acid every day if you become pregnant.  Ask for birth control (contraception) if you want to prevent pregnancy. Osteoporosis and menopause Osteoporosis is a disease in which the bones lose minerals and strength with aging. This can result in bone fractures. If you are 15 years old or older, or if you are at risk for osteoporosis and fractures, ask your health care provider if you should:  Be screened for bone loss.  Take a calcium or vitamin D supplement to lower your risk of fractures.  Be given hormone replacement therapy (HRT) to treat symptoms of menopause. Follow these instructions at home: Lifestyle  Do not use any products that contain nicotine or tobacco, such as cigarettes, e-cigarettes, and chewing tobacco. If you need help quitting, ask your health care provider.  Do not use street drugs.  Do not share needles.  Ask your health care provider for help if you need support or information about quitting drugs. Alcohol use  Do not drink alcohol if: ? Your health care provider tells you not to drink. ? You are pregnant, may be pregnant, or are planning to become pregnant.  If you drink alcohol: ? Limit how much you use to 0-1 drink a day. ? Limit intake if you are breastfeeding.  Be aware of how much alcohol is in your drink. In the [place] one drink equals one 12 oz bottle of beer (355 mL), one 5 oz glass of wine (148 mL), or one 1 oz glass of hard liquor (44 mL). General instructions  Schedule regular health, dental, and eye exams.  Stay current with your vaccines.  Tell your health care provider if: ? You often feel depressed. ? You have ever been abused or do not feel safe at home. Summary  Adopting a healthy lifestyle and getting preventive care are important in promoting health and wellness.  Follow your health care provider's instructions about healthy  diet, exercising, and getting tested or screened for diseases.  Follow your health care provider's instructions on monitoring your cholesterol and blood pressure. This information is not intended to replace advice given to you by your health care provider. Make sure you discuss any questions you have with your health care provider. Document Revised: 01/09/2018 Document Reviewed: 01/09/2018 Elsevier Patient Education  2021 [employer].   If you are age 83 or older, your body mass index should be between 23-30. Your Body mass index is Body mass index is 42.76 kg/m.Marland Kitchen If this is above the aforementioned range listed, you are consider overweight and if BMI > 35 you are conisdered obese, by medical definition and standards. Routine daily exercise and dietary modifications are encouraged. If you would like additional guidance on weight loss, please make an appointment for weight loss counseling - must be an appointment dedicate to weight loss counseling alone. I would be happy to help you.   If you are age 38 or younger, your body  mass index should be between 19-25. Your Body mass index is Body mass index is 42.76 kg/m. Marland Kitchen If this is above the aformentioned range listed, you are consider overwieght and obese if BMI > 30, by medical definition and standards. Routine daily exercise and dietary modifications are encouraged. If you would like additional guidance on weight loss, please make an appointment for weight loss counseling - must be an appointment dedicate to weight loss counseling alone. I would be happy to help you.   -counseled on exercise, calorie counting, weight loss  - Weekly net calorie calculator .  Applications for calorie counting.  https://www.calculator.net/calorie-calculator.html -glycemic index> homework -exercise goal of 150 minutes a week (plus warm up and cool down) of cardiovascular exercise.   -maintain adequate water consumption of at least 120 ounces a day, more if  exercising/sweating.

## 2020-03-19 LAB — HEPATITIS C ANTIBODY
Hepatitis C Ab: NONREACTIVE
SIGNAL TO CUT-OFF: 0.01 (ref <1.00)

## 2020-05-12 ENCOUNTER — Other Ambulatory Visit: Payer: Self-pay

## 2020-05-12 ENCOUNTER — Encounter: Payer: Self-pay | Admitting: Family Medicine

## 2020-05-12 ENCOUNTER — Ambulatory Visit (INDEPENDENT_AMBULATORY_CARE_PROVIDER_SITE_OTHER): Payer: BC Managed Care – PPO | Admitting: Family Medicine

## 2020-05-12 VITALS — BP 105/72 | HR 82 | Temp 97.6°F | Wt 245.0 lb

## 2020-05-12 DIAGNOSIS — Z8249 Family history of ischemic heart disease and other diseases of the circulatory system: Secondary | ICD-10-CM | POA: Diagnosis not present

## 2020-05-12 DIAGNOSIS — Z713 Dietary counseling and surveillance: Secondary | ICD-10-CM

## 2020-05-12 MED ORDER — NALTREXONE HCL 50 MG PO TABS: 25.0000 mg | ORAL_TABLET | Freq: Two times a day (BID) | ORAL | 2 refills | Status: DC | PRN

## 2020-05-12 NOTE — Patient Instructions (Signed)
You are doing great.  Continue exercise- increase minutes in the cardiovascular zone.   Naltrexone called in for you .   4-6 weeks follow up on weight check.

## 2020-05-12 NOTE — Progress Notes (Signed)
This visit occurred during the SARS-CoV-2 public health emergency.  Safety protocols were in place, including screening questions prior to the visit, additional usage of staff PPE, and extensive cleaning of exam room while observing appropriate contact time as indicated for disinfecting solutions.    Julie Cummings , 02/13/1990, 30 y.o., female MRN: [id number] Patient Care Team    Relationship Specialty Notifications Start End  Natalia Leatherwood, DO PCP - General Family Medicine  02/04/20   Jacklyn Shell, CNM Midwife Certified Nurse Midwife  02/04/20    Comment: ob/gyn- [facility]    Chief Complaint  Patient presents with  . Weight Check     Subjective: Pt presents for an OV with weight loss counseling.  anxiety/palpitations/weight gain: Starting weight 255. Today's weight: 245 Diet: She reports she has been watching her diet closely.  She has cut back on carbohydrates.  She has admitted all soda.  She is closely monitoring foods and trying to consume lower glycemic index carbohydrates. She has started meal prepping for her breakfast and lunch and noticed this is working well.  She feels her downfall is around dinnertime, she will be fatigued and will eat what ever she makes the kids.  She has also noticed that she is desiring snacks, and will eat potato chips when feeling she needs something to snack on. Exercise: She is exercising approximately 5-6 days a week.  Her average heart rate during exercise is approximately 135. Calories: She is monitoring her weight and calculating her calories weekly for appropriateness.  She is currently taking in approximately 1610 cal a day.  Depression screen [facility] 2/9 03/18/2020 02/04/2020 06/05/2017  Decreased Interest 0 0 0  Down, Depressed, Hopeless 0 0 0  PHQ - 2 Score 0 0 0  Altered sleeping 1 - -  Tired, decreased energy 1 - -  Change in appetite 0 - -  Feeling bad or failure about yourself  0 - -  Trouble concentrating 0 - -  Moving  slowly or fidgety/restless 0 - -  Suicidal thoughts 0 - -  PHQ-9 Score 2 - -    Allergies  Allergen Reactions  . Morphine And Related Other (See Comments)    Racing heart; when patient had gallbladder taken out in December 2012, MD told patient that her heart was racing and not to take Morphine again.  Marland Kitchen Morphine Palpitations   Social History   Social History Narrative   Marital status/children/pets: Married, 3 children   Education/employment: Automotive engineer, TEFL teacher:      - wears seatbelt: Yes     - Feels safe in their relationships: Yes   Past Medical History:  Diagnosis Date  . Allergy   . Chronic headaches   . Depression   . Gall stones   . GERD (gastroesophageal reflux disease)   . History of UTI   . Preeclampsia   . Rh negative state in antepartum period 10/07/2014   B-   Rhogam after 28wks: 04/27/2015     Past Surgical History:  Procedure Laterality Date  . CHOLECYSTECTOMY  12/2010  . ERCP    . TONSILLECTOMY  1977   Family History  Problem Relation Age of Onset  . Hypertension Sister   . Arthritis Sister   . Depression Sister   . Miscarriages / Stillbirths Sister   . CAD Maternal Grandfather   . Diabetes Maternal Grandfather   . Stroke Maternal Grandfather   . Heart attack Maternal Grandfather   .  Heart disease Maternal Grandfather   . Hearing loss Maternal Grandfather   . Hyperlipidemia Maternal Grandfather   . Hypertension Maternal Grandfather   . Diabetes Paternal Grandfather   . Heart disease Paternal Grandfather   . Stroke Paternal Grandfather   . Heart attack Paternal Grandfather   . Diabetes Mother   . Arthritis Mother   . Hearing loss Mother   . Hypertension Mother   . Diabetes Father   . Thyroid disease Maternal Grandmother   . Heart disease Paternal Grandmother   . Miscarriages / Stillbirths Sister   . Hypertension Other        grandparents  . Asthma Son   . Asthma Son    Allergies as of 05/12/2020      Reactions    Morphine And Related Other (See Comments)   Racing heart; when patient had gallbladder taken out in December 2012, MD told patient that her heart was racing and not to take Morphine again.   Morphine Palpitations      Medication List       Accurate as of May 12, 2020 11:59 PM. If you have any questions, ask your nurse or doctor.        albuterol 108 (90 Base) MCG/ACT inhaler Commonly known as: VENTOLIN HFA Inhale 1-2 puffs into the lungs every 6 (six) hours as needed.   escitalopram 10 MG tablet Commonly known as: Lexapro Take 1 tablet (10 mg total) by mouth daily.   Lo Loestrin Fe 1 MG-10 MCG / 10 MCG tablet Generic drug: Norethindrone-Ethinyl Estradiol-Fe Biphas Take 1 tablet by mouth daily.   naltrexone 50 MG tablet Commonly known as: DEPADE Take 0.5 tablets (25 mg total) by mouth 2 (two) times daily as needed. Started by: Felix Pacini, DO       All past medical history, surgical history, allergies, family history, immunizations andmedications were updated in the EMR today and reviewed under the history and medication portions of their EMR.     ROS: Negative, with the exception of above mentioned in HPI   Objective:  BP 105/72   Pulse 82   Temp 97.6 F (36.4 C) (Oral)   Wt 245 lb (111.1 kg)   SpO2 97%   BMI 41.09 kg/m  Body mass index is 41.09 kg/m. Gen: Afebrile. No acute distress. Nontoxic in appearance, well developed, well nourished.  HENT: AT. [place].  Eyes:Pupils Equal Round Reactive to light, Extraocular movements intact,  Conjunctiva without redness, discharge or icterus. CV: RRR  Chest: CTAB Abd: Soft. NTND. BS present Neuro: Normal gait. PERLA. EOMi. Alert. Oriented x3  Psych: Normal affect, dress and demeanor. Normal speech. Normal thought content and judgment.  No exam data present No results found. No results found for this or any previous visit (from the past 24 hour(s)).  Assessment/Plan: Julie Cummings is a 30 y.o. female present for OV  for  Weight loss counseling, encounter for/Morbid obesity (HCC)/FHX heart disease/stroke Start weight: 255 lbs.  Today's weight: 245 pounds-she has lost 10 pounds since start She was encouraged to make weight loss goals in 4-6 mos increments and final weight loss goal (170).  Patient was counseled on exercise, calorie counting, weight loss -Patient was provided with online resources for: Weekly net calorie calculator.  Applications for calorie counting.  Patient was advised to ensure she is taking in adequate nutrition daily by meeting calorie goals. -Patient was educated on dietary changes to not only lose weight but to eat healthy.  Patient was educated on  glycemic index. -Patient was educated on exercise goal of 150 minutes a week (plus warm up and cool down) of cardiovascular exercise.  Patient was educated on heart rate for cardiovascular and fat burning zones -Patient was encouraged to maintain adequate water consumption of at least 120 ounces a day, more if exercising/sweating. -Recommended changes:    -She will work on meal prepping for her dinner.  We discussed her precooking meals and freezing for herself so that they are readily accessible and easily microwavable.    -She will increase to 150 minutes a week of her exercise in the cardiovascular/burning zone.  She currently is not quite reaching her fat burning zone the majority of the time, but she is doing great with getting in the routine of exercising.    -We discussed healthier options for snacks when craving something crunchy.    -We discussed naltrexone start to help with cravings.  And she would like to try this today.  She was provided with instructions on tapering.     -Continue adequate hydration of approximately 100-120 ounces a day.  She is meeting this goal the majority of days. Follow-up for weight check in 4-6 weeks.   Reviewed expectations re: course of current medical issues.  Discussed self-management of  symptoms.  Outlined signs and symptoms indicating need for more acute intervention.  Patient verbalized understanding and all questions were answered.  Patient received an After-Visit Summary.    No orders of the defined types were placed in this encounter.  Meds ordered this encounter  Medications  . naltrexone (DEPADE) 50 MG tablet    Sig: Take 0.5 tablets (25 mg total) by mouth 2 (two) times daily as needed.    Dispense:  60 tablet    Refill:  2   Referral Orders  No referral(s) requested today     Note is dictated utilizing voice recognition software. Although note has been proof read prior to signing, occasional typographical errors still can be missed. If any questions arise, please do not hesitate to call for verification.   electronically signed by:  Felix Pacini, DO  [facility] [place]

## 2020-06-22 ENCOUNTER — Ambulatory Visit: Payer: BC Managed Care – PPO | Admitting: Family Medicine

## 2020-07-05 ENCOUNTER — Other Ambulatory Visit: Payer: Self-pay

## 2020-07-05 ENCOUNTER — Ambulatory Visit (INDEPENDENT_AMBULATORY_CARE_PROVIDER_SITE_OTHER): Payer: BC Managed Care – PPO | Admitting: Family Medicine

## 2020-07-05 ENCOUNTER — Encounter: Payer: Self-pay | Admitting: Family Medicine

## 2020-07-05 DIAGNOSIS — Z713 Dietary counseling and surveillance: Secondary | ICD-10-CM | POA: Diagnosis not present

## 2020-07-05 MED ORDER — BUPROPION HCL ER (SR) 100 MG PO TB12: 100.0000 mg | ORAL_TABLET | Freq: Two times a day (BID) | ORAL | 5 refills | Status: DC

## 2020-07-05 NOTE — Progress Notes (Signed)
This visit occurred during the SARS-CoV-2 public health emergency.  Safety protocols were in place, including screening questions prior to the visit, additional usage of staff PPE, and extensive cleaning of exam room while observing appropriate contact time as indicated for disinfecting solutions.    Julie Cummings , 02-09-1990, 30 y.o., female MRN: [id number] Patient Care Team    Relationship Specialty Notifications Start End  Natalia Leatherwood, DO PCP - General Family Medicine  02/04/20   Jacklyn Shell, CNM Midwife Certified Nurse Midwife  02/04/20    Comment: ob/gyn- family tree    Chief Complaint  Patient presents with  . Weight Loss     Subjective: Pt presents for an OV with weight loss counseling.  anxiety/palpitations/weight gain: Starting weight 255. Last weight: 245 Today's weight: 244 Diet: She reports she unfortunately could not tolerate naltrexone, it made her nauseated and dizzy. She had a rough few weeks surrounding the health of her father, the death of her dog and she herself had the stomach flu. She stopped drinking water and started drinking sprite and she has many school prepared meals. She reports she was down to 237 before she got off track.  Exercise: She is exercising approximately 5-6 days a week- only doing cardio 40 minutes a week.  Calories: She is monitoring her weight and calculating her calories weekly for appropriateness.  She is currently taking in approximately 1610 cal a day.  Depression screen [facility] 2/9 03/18/2020 02/04/2020 06/05/2017  Decreased Interest 0 0 0  Down, Depressed, Hopeless 0 0 0  PHQ - 2 Score 0 0 0  Altered sleeping 1 - -  Tired, decreased energy 1 - -  Change in appetite 0 - -  Feeling bad or failure about yourself  0 - -  Trouble concentrating 0 - -  Moving slowly or fidgety/restless 0 - -  Suicidal thoughts 0 - -  PHQ-9 Score 2 - -    Allergies  Allergen Reactions  . Morphine And Related Other (See Comments)     Racing heart; when patient had gallbladder taken out in December 2012, MD told patient that her heart was racing and not to take Morphine again.  Marland Kitchen Morphine Palpitations   Social History   Social History Narrative   Marital status/children/pets: Married, 3 children   Education/employment: Automotive engineer, TEFL teacher:      - wears seatbelt: Yes     - Feels safe in their relationships: Yes   Past Medical History:  Diagnosis Date  . Allergy   . Chronic headaches   . Depression   . Gall stones   . GERD (gastroesophageal reflux disease)   . History of UTI   . Preeclampsia   . Rh negative state in antepartum period 10/07/2014   B-   Rhogam after 28wks: 04/27/2015     Past Surgical History:  Procedure Laterality Date  . CHOLECYSTECTOMY  12/2010  . ERCP    . TONSILLECTOMY  1977   Family History  Problem Relation Age of Onset  . Hypertension Sister   . Arthritis Sister   . Depression Sister   . Miscarriages / Stillbirths Sister   . CAD Maternal Grandfather   . Diabetes Maternal Grandfather   . Stroke Maternal Grandfather   . Heart attack Maternal Grandfather   . Heart disease Maternal Grandfather   . Hearing loss Maternal Grandfather   . Hyperlipidemia Maternal Grandfather   . Hypertension Maternal Grandfather   . Diabetes Paternal  Grandfather   . Heart disease Paternal Grandfather   . Stroke Paternal Grandfather   . Heart attack Paternal Grandfather   . Diabetes Mother   . Arthritis Mother   . Hearing loss Mother   . Hypertension Mother   . Diabetes Father   . Thyroid disease Maternal Grandmother   . Heart disease Paternal Grandmother   . Miscarriages / Stillbirths Sister   . Hypertension Other        grandparents  . Asthma Son   . Asthma Son    Allergies as of 07/05/2020      Reactions   Morphine And Related Other (See Comments)   Racing heart; when patient had gallbladder taken out in December 2012, MD told patient that her heart was racing and not to  take Morphine again.   Morphine Palpitations      Medication List       Accurate as of July 05, 2020  8:49 AM. If you have any questions, ask your nurse or doctor.        STOP taking these medications   naltrexone 50 MG tablet Commonly known as: DEPADE Stopped by: Felix Pacini, DO     TAKE these medications   albuterol 108 (90 Base) MCG/ACT inhaler Commonly known as: VENTOLIN HFA Inhale 1-2 puffs into the lungs every 6 (six) hours as needed.   buPROPion 100 MG 12 hr tablet Commonly known as: Wellbutrin SR Take 1 tablet (100 mg total) by mouth 2 (two) times daily. Started by: Felix Pacini, DO   escitalopram 10 MG tablet Commonly known as: Lexapro Take 1 tablet (10 mg total) by mouth daily.   Lo Loestrin Fe 1 MG-10 MCG / 10 MCG tablet Generic drug: Norethindrone-Ethinyl Estradiol-Fe Biphas Take 1 tablet by mouth daily.   OVER THE COUNTER MEDICATION Thin 30 probiotic       All past medical history, surgical history, allergies, family history, immunizations andmedications were updated in the EMR today and reviewed under the history and medication portions of their EMR.     ROS: Negative, with the exception of above mentioned in HPI   Objective:  BP 117/77   Pulse 88   Temp 98.5 F (36.9 C) (Oral)   Ht 5\' 5"  (1.651 m)   Wt 244 lb (110.7 kg)   SpO2 97%   BMI 40.60 kg/m  Body mass index is 40.6 kg/m. Gen: Afebrile. No acute distress.  HENT: AT. [place].  Eyes:Pupils Equal Round Reactive to light, Extraocular movements intact,  Conjunctiva without redness, discharge or icterus. CV: RRR  Chest: CTAB, no wheeze or crackles Neuro: Normal gait. PERLA. EOMi. Alert. Oriented x3  Psych: Normal affect, dress and demeanor. Normal speech. Normal thought content and judgment   No exam data present No results found. No results found for this or any previous visit (from the past 24 hour(s)).  Assessment/Plan: Julie Cummings is a 30 y.o. female present for OV for  Weight  loss counseling, encounter for/Morbid obesity (HCC)/FHX heart disease/stroke Start weight: 255 lbs.  Today's weight: 245 pounds-she has lost 10 pounds since start She was encouraged to make weight loss goals in 4-6 mos increments and final weight loss goal (170).  Patient was counseled on exercise, calorie counting, weight loss -Patient was provided with online resources for: Weekly net calorie calculator.  Applications for calorie counting.  Patient was advised to ensure she is taking in adequate nutrition daily by meeting calorie goals. -Patient was educated on dietary changes to not only  lose weight but to eat healthy.  Patient was educated on glycemic index. -Patient was educated on exercise goal of 150 minutes a week (plus warm up and cool down) of cardiovascular exercise.  Patient was educated on heart rate for cardiovascular and fat burning zones -Patient was encouraged to maintain adequate water consumption of at least 120 ounces a day. -Recommended changes:    -She did better meal prepping for her dinner- but needs to prep more options so she does not tire of the meal.     -She will increase cardio to 80-100 minutes a week> goal 150 minutes a week of her exercise in the cardiovascular/burning zone.      -We discussed healthier options for snacks when craving something crunchy.    -she did not tolerate  Naltrexone- trial of wellbutrin BID prescribed.     -Continue adequate hydration of approximately 100-120 ounces a day.  She is meeting this goal the majority of days. - drink a glass of water prior to meals.  - trial of fiber supplement BID.  Follow-up for weight check in 4-6 weeks.   Reviewed expectations re: course of current medical issues.  Discussed self-management of symptoms.  Outlined signs and symptoms indicating need for more acute intervention.  Patient verbalized understanding and all questions were answered.  Patient received an After-Visit Summary.    No orders  of the defined types were placed in this encounter.  Meds ordered this encounter  Medications  . buPROPion (WELLBUTRIN SR) 100 MG 12 hr tablet    Sig: Take 1 tablet (100 mg total) by mouth 2 (two) times daily.    Dispense:  60 tablet    Refill:  5   Referral Orders  No referral(s) requested today     Note is dictated utilizing voice recognition software. Although note has been proof read prior to signing, occasional typographical errors still can be missed. If any questions arise, please do not hesitate to call for verification.   electronically signed by:  Felix Pacini, DO  [facility] - [place]

## 2020-07-05 NOTE — Patient Instructions (Signed)
You did a great job...until you got off track.   Prepare more than one type of meal for dinner when meal prepping.  Drink a glass of water before sitting down to meal.  Try a fiber supplement twice a day to help feel more full.   Wellbutrin twice daily.

## 2020-08-17 ENCOUNTER — Ambulatory Visit: Payer: BC Managed Care – PPO | Admitting: Family Medicine

## 2020-09-01 ENCOUNTER — Ambulatory Visit: Payer: BC Managed Care – PPO | Admitting: Family Medicine

## 2020-09-11 ENCOUNTER — Other Ambulatory Visit: Payer: Self-pay | Admitting: Advanced Practice Midwife

## 2020-09-15 ENCOUNTER — Telehealth: Payer: Self-pay | Admitting: Advanced Practice Midwife

## 2020-09-15 ENCOUNTER — Other Ambulatory Visit: Payer: Self-pay

## 2020-09-15 MED ORDER — LO LOESTRIN FE 1 MG-10 MCG / 10 MCG PO TABS: 1.0000 | ORAL_TABLET | Freq: Every day | ORAL | 1 refills | Status: DC

## 2020-09-15 NOTE — Telephone Encounter (Signed)
PT calling stating that she will be out of her bc on tomorrow she has apt set for 10/14/20 pap/physical but need script to last until then to be sent to [facility]

## 2020-09-15 NOTE — Telephone Encounter (Signed)
Discussed with Myrle Sheng, who scheduled pt for physical. Instructed her to tell her that a refill request would be sent to a provider and they may be able to give her enough to last until her appt. Refill request sent to Cyril Mourning, NP.

## 2020-10-14 ENCOUNTER — Other Ambulatory Visit: Payer: BC Managed Care – PPO | Admitting: Advanced Practice Midwife

## 2020-10-17 ENCOUNTER — Other Ambulatory Visit: Payer: Self-pay | Admitting: Family Medicine

## 2020-10-22 ENCOUNTER — Ambulatory Visit (INDEPENDENT_AMBULATORY_CARE_PROVIDER_SITE_OTHER): Payer: BC Managed Care – PPO | Admitting: Family Medicine

## 2020-10-22 ENCOUNTER — Encounter: Payer: Self-pay | Admitting: Family Medicine

## 2020-10-22 ENCOUNTER — Other Ambulatory Visit: Payer: Self-pay

## 2020-10-22 VITALS — BP 113/75 | HR 82 | Temp 97.7°F | Ht 65.0 in | Wt 257.0 lb

## 2020-10-22 DIAGNOSIS — F419 Anxiety disorder, unspecified: Secondary | ICD-10-CM

## 2020-10-22 DIAGNOSIS — Z713 Dietary counseling and surveillance: Secondary | ICD-10-CM

## 2020-10-22 DIAGNOSIS — J45990 Exercise induced bronchospasm: Secondary | ICD-10-CM

## 2020-10-22 DIAGNOSIS — R002 Palpitations: Secondary | ICD-10-CM

## 2020-10-22 MED ORDER — ESCITALOPRAM OXALATE 10 MG PO TABS: 10.0000 mg | ORAL_TABLET | Freq: Every day | ORAL | 5 refills | Status: DC

## 2020-10-22 MED ORDER — BUPROPION HCL ER (SR) 100 MG PO TB12: 100.0000 mg | ORAL_TABLET | Freq: Two times a day (BID) | ORAL | 5 refills | Status: DC

## 2020-10-22 MED ORDER — ALBUTEROL SULFATE HFA 108 (90 BASE) MCG/ACT IN AERS: 1.0000 | INHALATION_SPRAY | Freq: Four times a day (QID) | RESPIRATORY_TRACT | 1 refills | Status: DC | PRN

## 2020-10-22 NOTE — Progress Notes (Signed)
This visit occurred during the SARS-CoV-2 public health emergency.  Safety protocols were in place, including screening questions prior to the visit, additional usage of staff PPE, and extensive cleaning of exam room while observing appropriate contact time as indicated for disinfecting solutions.    Patient ID: Julie Cummings, female  DOB: 08-09-1990, 30 y.o.   MRN: [id number] Patient Care Team    Relationship Specialty Notifications Start End  Ma Hillock, DO PCP - General Family Medicine  02/04/20   Christin Fudge, CNM Midwife Certified Nurse Midwife  02/04/20    Comment: ob/gyn- family tree    Chief Complaint  Patient presents with   Anxiety    CMC;     Subjective: Julie Cummings is a 30 y.o.  Female  present for [facility] Asthma  Uses albuterol inhaler as needed.  Plans to increase her exercise regimen and therefore would like refills on this today.   anxiety/palpitations: Patient feels the Lexapro has been very helpful for her anxiety.    Weight gain/weight loss counseling: She unfortunately had an ankle injury since last being seen and has not been able to exercise.  She stopped her diet over this time as well and gained the weight back that she originally lost.  She also had not started the Wellbutrin.   Depression screen [facility] 2/9 10/22/2020 03/18/2020 02/04/2020 06/05/2017  Decreased Interest 0 0 0 0  Down, Depressed, Hopeless 0 0 0 0  PHQ - 2 Score 0 0 0 0  Altered sleeping 0 1 - -  Tired, decreased energy 1 1 - -  Change in appetite 3 0 - -  Feeling bad or failure about yourself  0 0 - -  Trouble concentrating 0 0 - -  Moving slowly or fidgety/restless 0 0 - -  Suicidal thoughts 0 0 - -  PHQ-9 Score 4 2 - -   GAD 7 : Generalized Anxiety Score 10/22/2020 03/18/2020  Nervous, Anxious, on Edge 0 1  Control/stop worrying 0 0  Worry too much - different things 0 1  Trouble relaxing 1 0  Restless 0 0  Easily annoyed or irritable 2 1  Afraid - awful might happen 0  -  Total GAD 7 Score 3 -     Immunization History  Administered Date(s) Administered   Influenza,inj,Quad PF,6+ Mos 11/05/2012   MMR 12/02/2010   PPD Test 09/14/2019   Rho (D) Immune Globulin 12/02/2010, 09/24/2012, 11/19/2012   Tdap 12/02/2010, 11/20/2012, 05/20/2015     Past Medical History:  Diagnosis Date   Allergy    Chronic headaches    Depression    Gall stones    GERD (gastroesophageal reflux disease)    History of UTI    Preeclampsia    Rh negative state in antepartum period 10/07/2014   B-   Rhogam after 28wks: 04/27/2015     Allergies  Allergen Reactions   Morphine And Related Other (See Comments) and Palpitations    Racing heart; when patient had gallbladder taken out in December 2012, MD told patient that her heart was racing and not to take Morphine again. Other reaction(s): Other (See Comments), Unknown Racing heart; when patient had gallbladder taken out in December 2012, MD told patient that her heart was racing and not to take Morphine again.   Morphine Palpitations   Past Surgical History:  Procedure Laterality Date   CHOLECYSTECTOMY  12/2010   ERCP     TONSILLECTOMY  1977   Family History  Problem  Relation Age of Onset   Hypertension Sister    Arthritis Sister    Depression Sister    Miscarriages / Stillbirths Sister    CAD Maternal Grandfather    Diabetes Maternal Grandfather    Stroke Maternal Grandfather    Heart attack Maternal Grandfather    Heart disease Maternal Grandfather    Hearing loss Maternal Grandfather    Hyperlipidemia Maternal Grandfather    Hypertension Maternal Grandfather    Diabetes Paternal Grandfather    Heart disease Paternal Grandfather    Stroke Paternal Grandfather    Heart attack Paternal Grandfather    Diabetes Mother    Arthritis Mother    Hearing loss Mother    Hypertension Mother    Diabetes Father    Thyroid disease Maternal Grandmother    Heart disease Paternal 8 /  Stillbirths Sister    Hypertension Other        grandparents   Asthma Son    Asthma Son    Social History   Social History Narrative   Marital status/children/pets: Married, 3 children   Education/employment: Secretary/administrator, Manufacturing engineer:      - wears seatbelt: Yes     - Feels safe in their relationships: Yes    Allergies as of 10/22/2020       Reactions   Morphine And Related Other (See Comments), Palpitations   Racing heart; when patient had gallbladder taken out in December 2012, MD told patient that her heart was racing and not to take Morphine again. Other reaction(s): Other (See Comments), Unknown Racing heart; when patient had gallbladder taken out in December 2012, MD told patient that her heart was racing and not to take Morphine again.   Morphine Palpitations        Medication List        Accurate as of October 22, 2020  5:05 PM. If you have any questions, ask your nurse or doctor.          STOP taking these medications    HYDROcodone-acetaminophen 5-325 MG tablet Commonly known as: NORCO/VICODIN Stopped by: Howard Pouch, DO       TAKE these medications    albuterol 108 (90 Base) MCG/ACT inhaler Commonly known as: VENTOLIN HFA Inhale 1-2 puffs into the lungs every 6 (six) hours as needed.   buPROPion ER 100 MG 12 hr tablet Commonly known as: Wellbutrin SR Take 1 tablet (100 mg total) by mouth 2 (two) times daily.   escitalopram 10 MG tablet Commonly known as: LEXAPRO Take 1 tablet (10 mg total) by mouth daily.   Lo Loestrin Fe 1 MG-10 MCG / 10 MCG tablet Generic drug: Norethindrone-Ethinyl Estradiol-Fe Biphas TAKE 1 TABLET BY MOUTH DAILY What changed: Another medication with the same name was removed. Continue taking this medication, and follow the directions you see here. Changed by: Howard Pouch, DO   OVER THE COUNTER MEDICATION Thin 30 probiotic        All past medical history, surgical history, allergies, family  history, immunizations andmedications were updated in the EMR today and reviewed under the history and medication portions of their EMR.      No results found.   ROS: 14 pt review of systems performed and negative (unless mentioned in an HPI)  Objective: BP 113/75   Pulse 82   Temp 97.7 F (36.5 C) (Oral)   Ht '5\' 5"'  (1.651 m)   Wt 257 lb (116.6 kg)   SpO2 97%  BMI 42.77 kg/m  Gen: Afebrile. No acute distress.  Nontoxic, very pleasant obese female. HENT: AT. [place].  Eyes:Pupils Equal Round Reactive to light, Extraocular movements intact,  Conjunctiva without redness, discharge or icterus. CV: RRR  Chest: CTAB, no wheeze or crackles Neuro:Normal gait. PERLA. EOMi. Alert. Oriented x3  Psych: Normal affect, dress and demeanor. Normal speech. Normal thought content and judgment..   No results found.  Assessment/plan: LINDSEE LABARRE is a 30 y.o. female present for [facility] Exercise-induced asthma Stable.  Continue albuterol inhaler as needed   Anxiety/potation's Continue Lexapro 10 mg for now.  She is starting Wellbutrin 100 mg twice daily to aid in weight loss control.  After 4 weeks on Wellbutrin she will attempt to taper back on Lexapro and then off by 4 weeks. Tried in past: Zoloft   Weight loss counseling, encounter for/Morbid obesity (HCC)/FHX heart disease/stroke Start weight: 255 lbs. >257 now since injury She was encouraged to make weight loss goals in 4-6 mos increments and final weight loss goal (170).  -Briefly touched base on all weight loss counseling instructions today to refresh memory.  She is now motivated to get back on track since her injury has recovered. Patient was counseled on exercise, calorie counting, weight loss -Patient was provided with online resources for: Weekly net calorie calculator.  Applications for calorie counting.  Patient was advised to ensure she is taking in adequate nutrition daily by meeting calorie goals. -Patient was educated on dietary  changes to not only lose weight but to eat healthy.  Patient was educated on glycemic index. -Patient was educated on exercise goal of 150 minutes a week (plus warm up and cool down) of cardiovascular exercise.  Patient was educated on heart rate for cardiovascular and fat burning zones. -Patient was encouraged to maintain adequate water consumption of at least 120 ounces a day, more if exercising/sweating.l F/u 4 weeks if desired further counseling and weight ins.     Return in about 4 weeks (around 11/19/2020) for weight management .   No orders of the defined types were placed in this encounter.  Meds ordered this encounter  Medications   escitalopram (LEXAPRO) 10 MG tablet    Sig: Take 1 tablet (10 mg total) by mouth daily.    Dispense:  30 tablet    Refill:  5   albuterol (VENTOLIN HFA) 108 (90 Base) MCG/ACT inhaler    Sig: Inhale 1-2 puffs into the lungs every 6 (six) hours as needed.    Dispense:  18 g    Refill:  1   buPROPion ER (WELLBUTRIN SR) 100 MG 12 hr tablet    Sig: Take 1 tablet (100 mg total) by mouth 2 (two) times daily.    Dispense:  60 tablet    Refill:  5    Referral Orders  No referral(s) requested today     Electronically signed by: Howard Pouch, [place]

## 2020-10-22 NOTE — Patient Instructions (Signed)
  Great to see you today.  I have refilled the medication(s) we provide.   If labs were collected, we will inform you of lab results once received either by echart message or telephone call.   - echart message- for normal results that have been seen by the patient already.   - telephone call: abnormal results or if patient has not viewed results in their echart.  4 week follow up.

## 2020-11-19 ENCOUNTER — Ambulatory Visit: Payer: BC Managed Care – PPO | Admitting: Family Medicine

## 2020-11-23 ENCOUNTER — Other Ambulatory Visit: Payer: Self-pay | Admitting: Adult Health

## 2020-12-07 ENCOUNTER — Ambulatory Visit: Payer: BC Managed Care – PPO | Admitting: Family Medicine

## 2020-12-07 ENCOUNTER — Encounter: Payer: Self-pay | Admitting: Family Medicine

## 2020-12-07 ENCOUNTER — Other Ambulatory Visit: Payer: Self-pay

## 2020-12-07 DIAGNOSIS — Z713 Dietary counseling and surveillance: Secondary | ICD-10-CM | POA: Diagnosis not present

## 2020-12-07 DIAGNOSIS — Z8249 Family history of ischemic heart disease and other diseases of the circulatory system: Secondary | ICD-10-CM

## 2020-12-07 MED ORDER — BUPROPION HCL ER (XL) 150 MG PO TB24: 150.0000 mg | ORAL_TABLET | Freq: Every day | ORAL | 5 refills | Status: DC

## 2020-12-07 MED ORDER — ESCITALOPRAM OXALATE 10 MG PO TABS: 10.0000 mg | ORAL_TABLET | Freq: Every day | ORAL | 5 refills | Status: DC

## 2020-12-07 NOTE — Progress Notes (Signed)
This visit occurred during the SARS-CoV-2 public health emergency.  Safety protocols were in place, including screening questions prior to the visit, additional usage of staff PPE, and extensive cleaning of exam room while observing appropriate contact time as indicated for disinfecting solutions.    Patient ID: Julie Cummings, female  DOB: February 09, 1990, 30 y.o.   MRN: [id number] Patient Care Team    Relationship Specialty Notifications Start End  Ma Hillock, DO PCP - General Family Medicine  02/04/20   Christin Fudge, CNM Midwife Certified Nurse Midwife  02/04/20    Comment: ob/gyn- family tree    Chief Complaint  Patient presents with   Weight Check    Subjective: Julie Cummings is a 30 y.o.  Female  present for [facility] Ast[facility]  Uses albuterol inhaler as needed.  Plans to increase her exercise regimen and therefore would like refills on this today.   anxiety/palpitations: Patient feels the Lexapro has been very helpful for her anxiety.    Weight gain/weight loss counseling: He had lost weight initially down to 244 pounds.  She unfortunately then sustained an injury to her ankle and was unable to exercise any longer and got off her diet during this time.  Her last appointment 4 weeks ago we discussed starting Wellbutrin to help with snacking urges.  She reports she did start the Wellbutrin but was only taking once a day because she felt like she was tired in the afternoon and was not certain if it was coming from this medication.  Her last weight was 257, and she has gained 3 pounds today.  Now 260. She reports she has cut back on all sugary sodas.  She is now drinking more water and drinking about 4 diet sodas a day. She reports she does well throughout the beginning of the day with her diet and then at the end of the day she gives in and start snacking.  She reports she is eating potato chips and things she buys for her children during this time. She has made some positive  change since her last visit and has started going to the gym 2 times a week.  She is having difficulty with cardio exercise secondary to her ankle right now, but has been "dead lifting "weights.   Depression screen [facility] 2/9 12/07/2020 10/22/2020 03/18/2020 02/04/2020 06/05/2017  Decreased Interest 0 0 0 0 0  Down, Depressed, Hopeless 0 0 0 0 0  PHQ - 2 Score 0 0 0 0 0  Altered sleeping - 0 1 - -  Tired, decreased energy - 1 1 - -  Change in appetite - 3 0 - -  Feeling bad or failure about yourself  - 0 0 - -  Trouble concentrating - 0 0 - -  Moving slowly or fidgety/restless - 0 0 - -  Suicidal thoughts - 0 0 - -  PHQ-9 Score - 4 2 - -   GAD 7 : Generalized Anxiety Score 10/22/2020 03/18/2020  Nervous, Anxious, on Edge 0 1  Control/stop worrying 0 0  Worry too much - different things 0 1  Trouble relaxing 1 0  Restless 0 0  Easily annoyed or irritable 2 1  Afraid - awful might happen 0 -  Total GAD 7 Score 3 -     Immunization History  Administered Date(s) Administered   Influenza,inj,Quad PF,6+ Mos 11/05/2012   MMR 12/02/2010   PPD Test 09/14/2019   Rho (D) Immune Globulin 12/02/2010, 09/24/2012, 11/19/2012  Tdap 12/02/2010, 11/20/2012, 05/20/2015     Past Medical History:  Diagnosis Date   Allergy    Chronic headaches    Depression    Gall stones    GERD (gastroesophageal reflux disease)    History of UTI    Preeclampsia    Rh negative state in antepartum period 10/07/2014   B-   Rhogam after 28wks: 04/27/2015     Allergies  Allergen Reactions   Morphine And Related Other (See Comments) and Palpitations    Racing heart; when patient had gallbladder taken out in December 2012, MD told patient that her heart was racing and not to take Morphine again. Other reaction(s): Other (See Comments), Unknown Racing heart; when patient had gallbladder taken out in December 2012, MD told patient that her heart was racing and not to take Morphine again.   Morphine Palpitations    Past Surgical History:  Procedure Laterality Date   CHOLECYSTECTOMY  12/2010   ERCP     TONSILLECTOMY  1977   Family History  Problem Relation Age of Onset   Hypertension Sister    Arthritis Sister    Depression Sister    Miscarriages / Stillbirths Sister    CAD Maternal Grandfather    Diabetes Maternal Grandfather    Stroke Maternal Grandfather    Heart attack Maternal Grandfather    Heart disease Maternal Grandfather    Hearing loss Maternal Grandfather    Hyperlipidemia Maternal Grandfather    Hypertension Maternal Grandfather    Diabetes Paternal Grandfather    Heart disease Paternal Grandfather    Stroke Paternal Grandfather    Heart attack Paternal Grandfather    Diabetes Mother    Arthritis Mother    Hearing loss Mother    Hypertension Mother    Diabetes Father    Thyroid disease Maternal Grandmother    Heart disease Paternal 28 / Stillbirths Sister    Hypertension Other        grandparents   Asthma Son    Asthma Son    Social History   Social History Narrative   Marital status/children/pets: Married, 3 children   Education/employment: Secretary/administrator, Manufacturing engineer:      - wears seatbelt: Yes     - Feels safe in their relationships: Yes    Allergies as of 12/07/2020       Reactions   Morphine And Related Other (See Comments), Palpitations   Racing heart; when patient had gallbladder taken out in December 2012, MD told patient that her heart was racing and not to take Morphine again. Other reaction(s): Other (See Comments), Unknown Racing heart; when patient had gallbladder taken out in December 2012, MD told patient that her heart was racing and not to take Morphine again.   Morphine Palpitations        Medication List        Accurate as of December 07, 2020  2:45 PM. If you have any questions, ask your nurse or doctor.          STOP taking these medications    buPROPion ER 100 MG 12 hr tablet Commonly  known as: Wellbutrin SR Replaced by: buPROPion 150 MG 24 hr tablet Stopped by: Howard Pouch, DO       TAKE these medications    albuterol 108 (90 Base) MCG/ACT inhaler Commonly known as: VENTOLIN HFA Inhale 1-2 puffs into the lungs every 6 (six) hours as needed.   buPROPion 150 MG 24 hr  tablet Commonly known as: WELLBUTRIN XL Take 1 tablet (150 mg total) by mouth daily. Replaces: buPROPion ER 100 MG 12 hr tablet Started by: Howard Pouch, DO   escitalopram 10 MG tablet Commonly known as: LEXAPRO Take 1 tablet (10 mg total) by mouth daily.   Lo Loestrin Fe 1 MG-10 MCG / 10 MCG tablet Generic drug: Norethindrone-Ethinyl Estradiol-Fe Biphas TAKE 1 TABLET BY MOUTH EVERY DAY   OVER THE COUNTER MEDICATION Thin 30 probiotic        All past medical history, surgical history, allergies, family history, immunizations andmedications were updated in the EMR today and reviewed under the history and medication portions of their EMR.      No results found.   ROS: 14 pt review of systems performed and negative (unless mentioned in an HPI)  Objective: BP 111/73   Pulse 87   Temp 98.4 F (36.9 C) (Oral)   Ht _0  (1.651 m)   Wt 260 lb (117.9 kg)   SpO2 98%   BMI 43.27 kg/m  Gen: Afebrile. No acute distress.  HENT: AT. [place].  Eyes:Pupils Equal Round Reactive to light, Extraocular movements intact,  Conjunctiva without redness, discharge or icterus. Neuro: Normal gait. PERLA. EOMi. Alert. Orientedx4 Psych: Normal affect, dress and demeanor. Normal speech. Normal thought content and judgment..     No results found.  Assessment/plan: JORGE AMPARO is a 30 y.o. female present for [facility] Exercise-induced asthma Stable.   Continue albuterol inhaler as needed   Anxiety/potation's Stable.   Continue Lexapro 10 mg -she would like to stay on this for now.  We had discussed tapering off in the future. Change Wellbutrin to 24-hour/extended release Tried in past:  Zoloft Obesity/family history of heart disease: Cardio IQ insulin> consider Ozempic  Weight loss counseling, encounter for/Morbid obesity (HCC)/FHX heart disease/stroke She was encouraged to make weight loss goals in 4-6 mos increments and final weight loss goal (170).  Patient was counseled on exercise, calorie counting, weight loss -Patient was provided with online resources for: Weekly net calorie calculator.  Applications for calorie counting.  Patient was advised to ensure she is taking in adequate nutrition daily by meeting calorie goals. -Goals for Next appointment: -Strict calorie counting, weigh food/accuracy/honesty-1400-calorie  -Decrease diet sodas to once daily only, with goal to discontinue altogether by follow-up. -Increase time at the gym with more cardiovascular exercise and light weights/more reps F/u 4 weeks if desired further counseling and weight ins.     Return in about 4 weeks (around 01/04/2021) for [place] (30 min).   Orders Placed This Encounter  Procedures   Cardio IQ Insulin    Meds ordered this encounter  Medications   buPROPion (WELLBUTRIN XL) 150 MG 24 hr tablet    Sig: Take 1 tablet (150 mg total) by mouth daily.    Dispense:  30 tablet    Refill:  5   escitalopram (LEXAPRO) 10 MG tablet    Sig: Take 1 tablet (10 mg total) by mouth daily.    Dispense:  30 tablet    Refill:  5     Referral Orders  No referral(s) requested today     Electronically signed by: Howard Pouch, [place]

## 2020-12-09 ENCOUNTER — Ambulatory Visit: Payer: BC Managed Care – PPO

## 2020-12-14 ENCOUNTER — Ambulatory Visit (INDEPENDENT_AMBULATORY_CARE_PROVIDER_SITE_OTHER): Payer: BC Managed Care – PPO

## 2020-12-14 ENCOUNTER — Other Ambulatory Visit: Payer: Self-pay

## 2020-12-14 DIAGNOSIS — Z8249 Family history of ischemic heart disease and other diseases of the circulatory system: Secondary | ICD-10-CM

## 2020-12-14 DIAGNOSIS — Z713 Dietary counseling and surveillance: Secondary | ICD-10-CM

## 2020-12-20 ENCOUNTER — Other Ambulatory Visit: Payer: Self-pay | Admitting: Family Medicine

## 2020-12-21 LAB — CARDIO IQ INSULIN RESISTANCE PANEL WITH SCORE

## 2020-12-21 LAB — CARDIO IQ INSULIN: Insulin: 10 u[IU]/mL (ref <19.7)

## 2020-12-27 ENCOUNTER — Ambulatory Visit: Payer: BC Managed Care – PPO

## 2021-01-05 ENCOUNTER — Ambulatory Visit (INDEPENDENT_AMBULATORY_CARE_PROVIDER_SITE_OTHER): Payer: BC Managed Care – PPO

## 2021-01-05 ENCOUNTER — Other Ambulatory Visit: Payer: Self-pay

## 2021-01-05 DIAGNOSIS — Z8249 Family history of ischemic heart disease and other diseases of the circulatory system: Secondary | ICD-10-CM

## 2021-01-05 DIAGNOSIS — Z713 Dietary counseling and surveillance: Secondary | ICD-10-CM

## 2021-01-05 NOTE — Progress Notes (Signed)
Per the orders of Dr. Kuneff pt is here for labs pt tolerated draw well.  °

## 2021-01-11 ENCOUNTER — Telehealth: Payer: Self-pay | Admitting: Family Medicine

## 2021-01-11 LAB — CARDIO IQ INSULIN RESISTANCE PANEL WITH SCORE
C-PEPTIDE, LC/MS/MS: 1.63 ng/mL (ref 0.68–2.16)
INSULIN, INTACT, LC/MS/MS: 12 u[IU]/mL (ref <=16)
Insulin Resistance Score: 41 (ref <=66)

## 2021-01-11 NOTE — Telephone Encounter (Signed)
Please inform patient her insulin resistance panel did not result with any insulin resistance and was normal. We will discuss this in more detail at her follow-up appointment in a few days.

## 2021-01-11 NOTE — Telephone Encounter (Signed)
LM for pt to return call regarding results.  

## 2021-01-12 NOTE — Telephone Encounter (Signed)
Spoke with pt regarding labs and instructions.   

## 2021-01-13 ENCOUNTER — Ambulatory Visit (INDEPENDENT_AMBULATORY_CARE_PROVIDER_SITE_OTHER): Payer: BC Managed Care – PPO | Admitting: Family Medicine

## 2021-01-13 ENCOUNTER — Other Ambulatory Visit: Payer: Self-pay

## 2021-01-13 ENCOUNTER — Encounter: Payer: Self-pay | Admitting: Family Medicine

## 2021-01-13 VITALS — BP 105/72 | HR 82 | Temp 97.9°F | Ht 65.0 in | Wt 256.0 lb

## 2021-01-13 DIAGNOSIS — R7309 Other abnormal glucose: Secondary | ICD-10-CM | POA: Insufficient documentation

## 2021-01-13 MED ORDER — MOUNJARO 5 MG/0.5ML ~~LOC~~ SOAJ: 5.0000 mg | SUBCUTANEOUS | 2 refills | Status: DC

## 2021-01-13 NOTE — Progress Notes (Signed)
This visit occurred during the SARS-CoV-2 public health emergency.  Safety protocols were in place, including screening questions prior to the visit, additional usage of staff PPE, and extensive cleaning of exam room while observing appropriate contact time as indicated for disinfecting solutions.    Patient ID: Julie Cummings, female  DOB: 06-Jul-1990, 30 y.o.   MRN: [id number] Patient Care Team    Relationship Specialty Notifications Start End  Ma Hillock, DO PCP - General Family Medicine  02/04/20   Christin Fudge, [place] Midwife Certified Nurse Midwife  02/04/20    Comment: ob/gyn- family tree    Chief Complaint  Patient presents with   Obesity    F/u     Subjective: Julie Cummings is a 30 y.o.  Female  present for [facility] Asthma  Uses albuterol inhaler as needed.  Plans to increase her exercise regimen and therefore would like refills on this today.   anxiety/palpitations: Patient feels the Lexapro has been very helpful for her anxiety    Weight gain/weight loss counseling: 260> 256 Patient reports she has not been exercising. She has been making better food choices, but admits she has not been meeting the caloric goal 1400 cal/day.  She has been averaging around the 1600 -calorie/day.  She has been cutting back on high glycemic index foods, but admits she still not cutting them out of her diet and had pasta recently. She was able to meet her goal of only 1 diet soda a day. She has increased her water consumption. Her insulin resistant panel resulted with impaired insulin sensitivity.  Stable Prior note:  He had lost weight initially down to 244 pounds.  She unfortunately then sustained an injury to her ankle and was unable to exercise any longer and got off her diet during this time.  Her last appointment 4 weeks ago we discussed starting Wellbutrin to help with snacking urges.  She reports she did start the Wellbutrin but was only taking once a day because she felt  like she was tired in the afternoon and was not certain if it was coming from this medication.  Her last weight was 257, and she has gained 3 pounds today.  Now 260. She reports she has cut back on all sugary sodas.  She is now drinking more water and drinking about 4 diet sodas a day. She reports she does well throughout the beginning of the day with her diet and then at the end of the day she gives in and start snacking.  She reports she is eating potato chips and things she buys for her children during this time. She has made some positive change since her last visit and has started going to the gym 2 times a week.  She is having difficulty with cardio exercise secondary to her ankle right now, but has been "dead lifting "weights.   Depression screen [facility] 2/9 12/07/2020 10/22/2020 03/18/2020 02/04/2020 06/05/2017  Decreased Interest 0 0 0 0 0  Down, Depressed, Hopeless 0 0 0 0 0  PHQ - 2 Score 0 0 0 0 0  Altered sleeping - 0 1 - -  Tired, decreased energy - 1 1 - -  Change in appetite - 3 0 - -  Feeling bad or failure about yourself  - 0 0 - -  Trouble concentrating - 0 0 - -  Moving slowly or fidgety/restless - 0 0 - -  Suicidal thoughts - 0 0 - -  PHQ-9 Score - 4  2 - -   GAD 7 : Generalized Anxiety Score 10/22/2020 03/18/2020  Nervous, Anxious, on Edge 0 1  Control/stop worrying 0 0  Worry too much - different things 0 1  Trouble relaxing 1 0  Restless 0 0  Easily annoyed or irritable 2 1  Afraid - awful might happen 0 -  Total GAD 7 Score 3 -     Immunization History  Administered Date(s) Administered   Influenza,inj,Quad PF,6+ Mos 11/05/2012   MMR 12/02/2010   PPD Test 09/14/2019   Rho (D) Immune Globulin 12/02/2010, 09/24/2012, 11/19/2012   Tdap 12/02/2010, 11/20/2012, 05/20/2015     Past Medical History:  Diagnosis Date   Allergy    Chronic headaches    Depression    Gall stones    GERD (gastroesophageal reflux disease)    History of UTI    Preeclampsia    Rh negative  state in antepartum period 10/07/2014   B-   Rhogam after 28wks: 04/27/2015     Allergies  Allergen Reactions   Morphine And Related Other (See Comments) and Palpitations    Racing heart; when patient had gallbladder taken out in December 2012, MD told patient that her heart was racing and not to take Morphine again. Other reaction(s): Other (See Comments), Unknown Racing heart; when patient had gallbladder taken out in December 2012, MD told patient that her heart was racing and not to take Morphine again.   Morphine Palpitations   Past Surgical History:  Procedure Laterality Date   CHOLECYSTECTOMY  12/2010   ERCP     TONSILLECTOMY  1977   Family History  Problem Relation Age of Onset   Hypertension Sister    Arthritis Sister    Depression Sister    Miscarriages / Stillbirths Sister    CAD Maternal Grandfather    Diabetes Maternal Grandfather    Stroke Maternal Grandfather    Heart attack Maternal Grandfather    Heart disease Maternal Grandfather    Hearing loss Maternal Grandfather    Hyperlipidemia Maternal Grandfather    Hypertension Maternal Grandfather    Diabetes Paternal Grandfather    Heart disease Paternal Grandfather    Stroke Paternal Grandfather    Heart attack Paternal Grandfather    Diabetes Mother    Arthritis Mother    Hearing loss Mother    Hypertension Mother    Diabetes Father    Thyroid disease Maternal Grandmother    Heart disease Paternal 51 / Stillbirths Sister    Hypertension Other        grandparents   Asthma Son    Asthma Son    Social History   Social History Narrative   Marital status/children/pets: Married, 3 children   Education/employment: Secretary/administrator, Manufacturing engineer:      - wears seatbelt: Yes     - Feels safe in their relationships: Yes    Allergies as of 01/13/2021       Reactions   Morphine And Related Other (See Comments), Palpitations   Racing heart; when patient had gallbladder taken  out in December 2012, MD told patient that her heart was racing and not to take Morphine again. Other reaction(s): Other (See Comments), Unknown Racing heart; when patient had gallbladder taken out in December 2012, MD told patient that her heart was racing and not to take Morphine again.   Morphine Palpitations        Medication List        Accurate  as of January 13, 2021 12:26 PM. If you have any questions, ask your nurse or doctor.          albuterol 108 (90 Base) MCG/ACT inhaler Commonly known as: VENTOLIN HFA Inhale 1-2 puffs into the lungs every 6 (six) hours as needed.   buPROPion 150 MG 24 hr tablet Commonly known as: WELLBUTRIN XL Take 1 tablet (150 mg total) by mouth daily.   escitalopram 10 MG tablet Commonly known as: LEXAPRO Take 1 tablet (10 mg total) by mouth daily.   Lo Loestrin Fe 1 MG-10 MCG / 10 MCG tablet Generic drug: Norethindrone-Ethinyl Estradiol-Fe Biphas TAKE 1 TABLET BY MOUTH EVERY DAY   Mounjaro 5 MG/0.5ML Pen Generic drug: tirzepatide Inject 5 mg into the skin once a week. Started by: Howard Pouch, DO   [place] Thin 30 probiotic        All past medical history, surgical history, allergies, family history, immunizations andmedications were updated in the EMR today and reviewed under the history and medication portions of their EMR.      No results found.   ROS: 14 pt review of systems performed and negative (unless mentioned in an HPI)  Objective: BP 105/72    Pulse 82    Temp 97.9 F (36.6 C) (Oral)    Ht _0  (1.651 m)    Wt 256 lb (116.1 kg)    SpO2 97%    BMI 42.60 kg/m  Gen: Afebrile. No acute distress.  HENT: AT. [place].  Eyes:Pupils Equal Round Reactive to light, Extraocular movements intact,  Neuro: Normal gait. PERLA. EOMi. Alert. Oriented. Psych: Normal affect, dress and demeanor. Normal speech. Normal thought content and judgment..   No results found.  Assessment/plan: VIRGA HALTIWANGER is a 30  y.o. female present for [facility] Anxiety/potation's Stable Continue  Lexapro 10 mg -she would like to stay on this for now.  We had discussed tapering off in the future. Continue Wellbutrin to 24-hour/extended release Tried in past: Zoloft Obesity/family history of heart disease/impaired insulin sensitivity with features of insulin resistance: Cardio IQ insulin> impaired insulin sensitivity Start mounjaro 5 mg weekly injection.  Patient was provided with website to apply for $25 monthly card through manufacturer. She will make a nurse visit for proper injection technique tutorial. Weight loss counseling, encounter for/Morbid obesity (HCC)/FHX heart disease/stroke She was encouraged to make weight loss goals in 4-6 mos increments and final weight loss goal (170).  Patient was counseled on exercise, calorie counting, weight loss -Patient was provided with online resources for: Weekly net calorie calculator.  Applications for calorie counting.  Patient was advised to ensure she is taking in adequate nutrition daily by meeting calorie goals. -Goals for Next appointment: -Strict calorie counting, weigh food/accuracy/honesty-1400-calorie  -Decrease diet sodas to once daily only, with goal to discontinue altogether by follow-up. -Increase time at the gym with more cardiovascular exercise and light weights/more reps F/u 6 weeks     Return in about 6 weeks (around 02/24/2021) for [facility] (30 min).   No orders of the defined types were placed in this encounter.   Meds ordered this encounter  Medications   tirzepatide (MOUNJARO) 5 MG/0.5ML Pen    Sig: Inject 5 mg into the skin once a week.    Dispense:  2 mL    Refill:  2      Referral Orders  No referral(s) requested today     Electronically signed by: Howard Pouch, [place]

## 2021-01-13 NOTE — Patient Instructions (Addendum)
[email or web address]

## 2021-01-14 ENCOUNTER — Other Ambulatory Visit: Payer: Self-pay | Admitting: Family Medicine

## 2021-01-14 ENCOUNTER — Telehealth: Payer: Self-pay

## 2021-01-14 MED ORDER — OZEMPIC (0.25 OR 0.5 MG/DOSE) 2 MG/1.5ML ~~LOC~~ SOPN: 0.2500 mg | PEN_INJECTOR | SUBCUTANEOUS | 1 refills | Status: DC

## 2021-01-14 NOTE — Telephone Encounter (Signed)
Pt aware.

## 2021-01-14 NOTE — Telephone Encounter (Signed)
Pt called in regards to medication costing $500 with coupon. Spoke with pharmacist who stated that insurance does cover ozempic, trulicity and victoza.  Please advise on alt medication.

## 2021-01-14 NOTE — Telephone Encounter (Signed)
Called in ozempic 0.25 mg once weekly. We will taper up on med as tolerated each followup.

## 2021-01-14 NOTE — Addendum Note (Signed)
Addended by: Felix Pacini A on: 01/14/2021 09:36 AM   Modules accepted: Orders

## 2021-02-06 ENCOUNTER — Other Ambulatory Visit: Payer: Self-pay | Admitting: Family Medicine

## 2021-02-07 ENCOUNTER — Telehealth: Payer: Self-pay | Admitting: Family Medicine

## 2021-02-07 MED ORDER — OZEMPIC (0.25 OR 0.5 MG/DOSE) 2 MG/1.5ML ~~LOC~~ SOPN: 0.2500 mg | PEN_INJECTOR | SUBCUTANEOUS | 0 refills | Status: DC

## 2021-02-07 NOTE — Telephone Encounter (Signed)
Spoke with pt regarding medication refill pt stated that she need one pen prior to her appt.

## 2021-02-07 NOTE — Telephone Encounter (Signed)
Pt called wanting to speak to Dr. Kirtland Bouchard CMA, about her medication (ozempic).--KR Pt call back number: [phone]

## 2021-02-08 ENCOUNTER — Telehealth: Payer: Self-pay | Admitting: Family Medicine

## 2021-03-08 ENCOUNTER — Ambulatory Visit: Payer: BC Managed Care – PPO | Admitting: Family Medicine

## 2021-03-15 ENCOUNTER — Other Ambulatory Visit: Payer: Self-pay

## 2021-03-15 ENCOUNTER — Encounter: Payer: Self-pay | Admitting: Family Medicine

## 2021-03-15 ENCOUNTER — Ambulatory Visit: Payer: BC Managed Care – PPO | Admitting: Family Medicine

## 2021-03-15 VITALS — BP 125/79 | HR 77 | Temp 99.2°F | Ht 65.0 in | Wt 247.0 lb

## 2021-03-15 DIAGNOSIS — F419 Anxiety disorder, unspecified: Secondary | ICD-10-CM | POA: Diagnosis not present

## 2021-03-15 DIAGNOSIS — R7309 Other abnormal glucose: Secondary | ICD-10-CM

## 2021-03-15 DIAGNOSIS — Z6837 Body mass index (BMI) 37.0-37.9, adult: Secondary | ICD-10-CM | POA: Insufficient documentation

## 2021-03-15 DIAGNOSIS — Z713 Dietary counseling and surveillance: Secondary | ICD-10-CM

## 2021-03-15 DIAGNOSIS — J45990 Exercise induced bronchospasm: Secondary | ICD-10-CM

## 2021-03-15 DIAGNOSIS — Z6841 Body Mass Index (BMI) 40.0 and over, adult: Secondary | ICD-10-CM | POA: Insufficient documentation

## 2021-03-15 DIAGNOSIS — Z8249 Family history of ischemic heart disease and other diseases of the circulatory system: Secondary | ICD-10-CM

## 2021-03-15 MED ORDER — OZEMPIC (0.25 OR 0.5 MG/DOSE) 2 MG/1.5ML ~~LOC~~ SOPN: 0.5000 mg | PEN_INJECTOR | SUBCUTANEOUS | 0 refills | Status: DC

## 2021-03-15 MED ORDER — ALBUTEROL SULFATE HFA 108 (90 BASE) MCG/ACT IN AERS: 1.0000 | INHALATION_SPRAY | Freq: Four times a day (QID) | RESPIRATORY_TRACT | 1 refills | Status: DC | PRN

## 2021-03-15 NOTE — Progress Notes (Signed)
This visit occurred during the SARS-CoV-2 public health emergency.  Safety protocols were in place, including screening questions prior to the visit, additional usage of staff PPE, and extensive cleaning of exam room while observing appropriate contact time as indicated for disinfecting solutions.    Patient ID: Julie Cummings, female  DOB: 05/04/1990, 31 y.o.   MRN: [id number] Patient Care Team    Relationship Specialty Notifications Start End  Ma Hillock, DO PCP - General Family Medicine  02/04/20   Christin Fudge, CNM Midwife Certified Nurse Midwife  02/04/20    Comment: ob/gyn- family tree    Chief Complaint  Patient presents with   Obesity    Subjective: [place] is a 31 y.o.  Female  present for [facility]/weight loss counseling Asthma  Uses albuterol inhaler as needed.  Plans to increase her exercise regimen and therefore would like refills on this today.   anxiety/palpitations: Patient feels the Lexapro has been very helpful for her anxiety    Weight gain/weight loss counseling: 260> 256> 247! Patient reports she has been exercising- 2x week-30 min She has been making better food choices, but admits she has not been monitoring glycemic index.  She is meal prepping now.  She is STILL drinking diet soda 2x a day She stopped snacking  She has increased her water consumption. Ozempic caused mild nausea. Tolerating.   Prior note:  He had lost weight initially down to 244 pounds.  She unfortunately then sustained an injury to her ankle and was unable to exercise any longer and got off her diet during this time.  Her last appointment 4 weeks ago we discussed starting Wellbutrin to help with snacking urges.  She reports she did start the Wellbutrin but was only taking once a day because she felt like she was tired in the afternoon and was not certain if it was coming from this medication.  Her last weight was 257, and she has gained 3 pounds today.  Now 260. She  reports she has cut back on all sugary sodas.  She is now drinking more water and drinking about 4 diet sodas a day. She reports she does well throughout the beginning of the day with her diet and then at the end of the day she gives in and start snacking.  She reports she is eating potato chips and things she buys for her children during this time. She has made some positive change since her last visit and has started going to the gym 2 times a week.  She is having difficulty with cardio exercise secondary to her ankle right now, but has been "dead lifting "weights.   Depression screen [facility] 2/9 12/07/2020 10/22/2020 03/18/2020 02/04/2020 06/05/2017  Decreased Interest 0 0 0 0 0  Down, Depressed, Hopeless 0 0 0 0 0  PHQ - 2 Score 0 0 0 0 0  Altered sleeping - 0 1 - -  Tired, decreased energy - 1 1 - -  Change in appetite - 3 0 - -  Feeling bad or failure about yourself  - 0 0 - -  Trouble concentrating - 0 0 - -  Moving slowly or fidgety/restless - 0 0 - -  Suicidal thoughts - 0 0 - -  PHQ-9 Score - 4 2 - -   GAD 7 : Generalized Anxiety Score 10/22/2020 03/18/2020  Nervous, Anxious, on Edge 0 1  Control/stop worrying 0 0  Worry too much - different things 0 1  Trouble  relaxing 1 0  Restless 0 0  Easily annoyed or irritable 2 1  Afraid - awful might happen 0 -  Total GAD 7 Score 3 -     Immunization History  Administered Date(s) Administered   Influenza,inj,Quad PF,6+ Mos 11/05/2012   MMR 12/02/2010   PPD Test 09/14/2019   Rho (D) Immune Globulin 12/02/2010, 09/24/2012, 11/19/2012   Tdap 12/02/2010, 11/20/2012, 05/20/2015     Past Medical History:  Diagnosis Date   Allergy    Chronic headaches    Depression    Gall stones    GERD (gastroesophageal reflux disease)    History of UTI    Preeclampsia    Rh negative state in antepartum period 10/07/2014   B-   Rhogam after 28wks: 04/27/2015     Allergies  Allergen Reactions   Morphine And Related Other (See Comments) and  Palpitations    Racing heart; when patient had gallbladder taken out in December 2012, MD told patient that her heart was racing and not to take Morphine again. Other reaction(s): Other (See Comments), Unknown Racing heart; when patient had gallbladder taken out in December 2012, MD told patient that her heart was racing and not to take Morphine again.   Morphine Palpitations   Past Surgical History:  Procedure Laterality Date   CHOLECYSTECTOMY  12/2010   ERCP     TONSILLECTOMY  1977   Family History  Problem Relation Age of Onset   Hypertension Sister    Arthritis Sister    Depression Sister    Miscarriages / Stillbirths Sister    CAD Maternal Grandfather    Diabetes Maternal Grandfather    Stroke Maternal Grandfather    Heart attack Maternal Grandfather    Heart disease Maternal Grandfather    Hearing loss Maternal Grandfather    Hyperlipidemia Maternal Grandfather    Hypertension Maternal Grandfather    Diabetes Paternal Grandfather    Heart disease Paternal Grandfather    Stroke Paternal Grandfather    Heart attack Paternal Grandfather    Diabetes Mother    Arthritis Mother    Hearing loss Mother    Hypertension Mother    Diabetes Father    Thyroid disease Maternal Grandmother    Heart disease Paternal 89 / Stillbirths Sister    Hypertension Other        grandparents   Asthma Son    Asthma Son    Social History   Social History Narrative   Marital status/children/pets: Married, 3 children   Education/employment: Secretary/administrator, Manufacturing engineer:      - wears seatbelt: Yes     - Feels safe in their relationships: Yes    Allergies as of 03/15/2021       Reactions   Morphine And Related Other (See Comments), Palpitations   Racing heart; when patient had gallbladder taken out in December 2012, MD told patient that her heart was racing and not to take Morphine again. Other reaction(s): Other (See Comments), Unknown Racing heart;  when patient had gallbladder taken out in December 2012, MD told patient that her heart was racing and not to take Morphine again.   Morphine Palpitations        Medication List        Accurate as of March 15, 2021 10:17 AM. If you have any questions, ask your nurse or doctor.          albuterol 108 (90 Base) MCG/ACT inhaler Commonly known as: VENTOLIN  HFA Inhale 1-2 puffs into the lungs every 6 (six) hours as needed.   buPROPion 150 MG 24 hr tablet Commonly known as: WELLBUTRIN XL Take 1 tablet (150 mg total) by mouth daily.   escitalopram 10 MG tablet Commonly known as: LEXAPRO Take 1 tablet (10 mg total) by mouth daily.   Lo Loestrin Fe 1 MG-10 MCG / 10 MCG tablet Generic drug: Norethindrone-Ethinyl Estradiol-Fe Biphas TAKE 1 TABLET BY MOUTH EVERY DAY   OVER THE COUNTER MEDICATION Thin 30 probiotic   Ozempic (0.25 or 0.5 MG/DOSE) 2 MG/1.5ML Sopn Generic drug: Semaglutide(0.25 or 0.5MG/DOS) Inject 0.5 mg into the skin once a week. What changed: how much to take Changed by: Howard Pouch, DO        All past medical history, surgical history, allergies, family history, immunizations andmedications were updated in the EMR today and reviewed under the history and medication portions of their EMR.      No results found.   ROS: 14 pt review of systems performed and negative (unless mentioned in an HPI)  Objective: BP 125/79    Pulse 77    Temp 99.2 F (37.3 C) (Oral)    Ht '5\' 5"'  (1.651 m)    Wt 247 lb (112 kg)    SpO2 99%    BMI 41.10 kg/m  Physical Exam Vitals and nursing note reviewed.  Constitutional:      General: She is not in acute distress.    Appearance: Normal appearance. She is normal weight. She is not ill-appearing or toxic-appearing.  Eyes:     Extraocular Movements: Extraocular movements intact.     Conjunctiva/sclera: Conjunctivae normal.     Pupils: Pupils are equal, round, and reactive to light.  Neurological:     Mental Status: She is  alert and oriented to person, place, and time. Mental status is at baseline.  Psychiatric:        Mood and Affect: Mood normal.        Behavior: Behavior normal.        Thought Content: Thought content normal.        Judgment: Judgment normal.     No results found.  Assessment/plan: Julie Cummings is a 31 y.o. female present for [facility] Anxiety/potation's stable Continue  Lexapro 10 mg -she would like to stay on this for now.  We had discussed tapering off in the future. 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  Weight loss counseling, encounter for/Morbid obesity (HCC)/FHX heart disease/stroke She was encouraged to make weight loss goals in 4-6 mos increments and final weight loss goal (170).  Patient was counseled on exercise, calorie counting, weight loss -Patient was provided with online resources for: Weekly net calorie calculator.  Applications for calorie counting.  Patient was advised to ensure she is taking in adequate nutrition daily by meeting calorie goals. -Goals for Next appointment: -Strict calorie counting, weigh food/accuracy/honesty-1400-calorie  - GLYCEMIC INDEX -Stop all diet soft drinks.  -Increase time at the gym with more cardiovascular exercise and light weights/more reps: 45 min/3xw F/u 4 weeks    Return in about 4 weeks (around 04/12/2021) for [facility] (30 min).   No orders of the defined types were placed in this encounter.    Meds ordered this encounter  Medications   albuterol (VENTOLIN HFA) 108 (90 Base) MCG/ACT inhaler  Sig: Inhale 1-2 puffs into the lungs every 6 (six) hours as needed.    Dispense:  18 g    Refill:  1   Semaglutide,0.25 or 0.5MG/DOS, (OZEMPIC, 0.25 OR 0.5 MG/DOSE,) 2 MG/1.5ML SOPN    Sig: Inject 0.5 mg into  the skin once a week.    Dispense:  1.5 mL    Refill:  0    Last low dose script. Next script is 1 mg weekly dose.      Referral Orders  No referral(s) requested today     Electronically signed by: Howard Pouch, [place]

## 2021-03-15 NOTE — Patient Instructions (Addendum)
°  GREAT JOB!!!!! 13 lbs so far.  Goals next visit:  - 45 min work outs- 3x week.  - pay attention to glycemic index more [place]_ STOOOOPPPPP!!!  Increase Ozempic dose to 0.5 mg inject weekly.

## 2021-03-21 ENCOUNTER — Encounter: Payer: BC Managed Care – PPO | Admitting: Family Medicine

## 2021-04-15 ENCOUNTER — Ambulatory Visit: Payer: BC Managed Care – PPO | Admitting: Family Medicine

## 2021-04-22 ENCOUNTER — Other Ambulatory Visit: Payer: Self-pay

## 2021-04-22 ENCOUNTER — Encounter: Payer: Self-pay | Admitting: Family Medicine

## 2021-04-22 ENCOUNTER — Ambulatory Visit (INDEPENDENT_AMBULATORY_CARE_PROVIDER_SITE_OTHER): Payer: BC Managed Care – PPO | Admitting: Family Medicine

## 2021-04-22 VITALS — BP 113/76 | HR 92 | Temp 97.6°F | Ht 65.0 in | Wt 240.6 lb

## 2021-04-22 DIAGNOSIS — J029 Acute pharyngitis, unspecified: Secondary | ICD-10-CM

## 2021-04-22 LAB — POCT RAPID STREP A (OFFICE): Rapid Strep A Screen: NEGATIVE

## 2021-04-22 MED ORDER — AMOXICILLIN 875 MG PO TABS: 875.0000 mg | ORAL_TABLET | Freq: Two times a day (BID) | ORAL | 0 refills | Status: AC

## 2021-04-22 NOTE — Progress Notes (Signed)
OFFICE VISIT ? ?04/22/2021 ? ?CC:  ?Chief Complaint  ?Patient presents with  ? Sore Throat  ?  Hurts when swallowing, feels raw. Pt also c/o ears hurt  ? Headache  ?  Intermittent; has taken Motrin for relief ans seems to be helping so far.  ? ? ?Patient is a 31 y.o. female who presents for sore throat. ? ?HPI: ?Onset 2 days ago sore throat, headache, ears aching.  Subjective fever/chills last night, no temperature checked at that time. ?No nasal congestion, runny nose, or cough. ?No neck pain or fullness.  No nausea vomiting or diarrhea.  ?Fatigued. 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 ? ?Past Medical History:  ?Diagnosis Date  ? Allergy   ? Chronic headaches   ? Depression   ? Gall stones   ? GERD (gastroesophageal reflux disease)   ? History of UTI   ? Preeclampsia   ? Rh negative state in antepartum period 10/07/2014  ? B-   Rhogam after 28wks: 04/27/2015    ? ? ?Past Surgical History:  ?Procedure Laterality Date  ? CHOLECYSTECTOMY  12/2010  ? ERCP    ? TONSILLECTOMY  1977  ? ? ?Outpatient Medications Prior to Visit  ?Medication Sig Dispense Refill  ? albuterol (VENTOLIN HFA) 108 (90 Base) MCG/ACT inhaler Inhale 1-2 puffs into the lungs every 6 (six) hours as needed. 18 g 1  ? buPROPion (WELLBUTRIN XL) 150 MG 24 hr tablet Take 1 tablet (150 mg total) by mouth daily. 30 tablet 5  ? escitalopram (LEXAPRO) 10 MG tablet Take 1 tablet (10 mg total) by mouth daily. 30 tablet 5  ? LO LOESTRIN FE 1 MG-10 MCG / 10 MCG tablet TAKE 1 TABLET BY MOUTH EVERY DAY 28 tablet 9  ? OVER THE COUNTER MEDICATION Thin 30 probiotic    ? Semaglutide,0.25 or 0.5MG /DOS, (OZEMPIC, 0.25 OR 0.5 MG/DOSE,) 2 MG/1.5ML SOPN Inject 0.5 mg into the skin once a week. 1.5 mL 0  ? ?No facility-administered medications prior to visit.  ? ? ?Allergies  ?Allergen Reactions  ? Morphine And Related Other (See Comments) and Palpitations  ?  Racing heart; when patient had gallbladder taken  out in December 2012, MD told patient that her heart was racing and not to take Morphine again. ?Other reaction(s): Other (See Comments), Unknown ?Racing heart; when patient had gallbladder taken out in December 2012, MD told patient that her heart was racing and not to take Morphine again.  ? Morphine Palpitations  ? ? ?ROS ?As per HPI ? ?PE: ? ?  04/22/2021  ?  8:53 AM 03/15/2021  ?  9:49 AM 01/13/2021  ?  9:39 AM  ?Vitals with BMI  ?Height 5\' 5"  5\' 5"  5\' 5"   ?Weight 240 lbs 10 oz 247 lbs 256 lbs  ?BMI 40.04 41.1 42.6  ?Systolic 113 125  ?Diastolic 76 79 72  ?Pulse 92 77 82  ? ? ? ?Physical Exam ? ?VS: noted--normal. ?Gen: alert, NAD, NONTOXIC APPEARING. ?HEENT: eyes without injection, drainage, or swelling.  Ears: EACs clear, TMs with normal light reflex and landmarks.  Nose: No rhinorrhea, congestion, or injection.  No paranasal sinus TTP.  No facial swelling.  Throat and mouth without focal lesion.  Mild posterior pharyngeal and soft palate erythema.  No swelling or exudate. ?Neck: supple, no LAD anteriorly or posteriorly.   ?LUNGS: CTA bilat, nonlabored resps.   ?CV: RRR, no m/r/g. ?EXT: no c/c/e ?SKIN:  no rash ? ? ?LABS:  ?Last CBC ?Lab Results  ?Component Value Date  ? WBC 8.3 03/18/2020  ? HGB 14.2 03/18/2020  ? HCT 42.1 03/18/2020  ? MCV 89.0 03/18/2020  ? MCH 29.8 05/18/2015  ? RDW 13.6 03/18/2020  ? PLT 225.0 03/18/2020  ? ?Last metabolic panel ?Lab Results  ?Component Value Date  ? GLUCOSE 89 03/18/2020  ? NA 138 03/18/2020  ? K 4.0 03/18/2020  ? CL 104 03/18/2020  ? CO2 28 03/18/2020  ? BUN 11 03/18/2020  ? CREATININE 0.86 03/18/2020  ? CALCIUM 8.9 03/18/2020  ? PROT 6.3 03/18/2020  ? ALBUMIN 3.9 03/18/2020  ? BILITOT 1.0 03/18/2020  ? ALKPHOS 90 03/18/2020  ? AST 17 03/18/2020  ? ALT 27 03/18/2020  ? ? Rapid strep today: NEG ? ?IMPRESSION AND PLAN: ? ?Acute pharyngitis, exposure to strep throat. ?Rapid strep neg today. ?Strep clx ordered. ?Her symptoms match very well with strep throat so I will  treat based on this and her exposure. ?Amoxicillin 875 twice daily x10 days.  Continue ibuprofen and push fluids ? ?An After Visit Summary was printed and given to the patient. ? ?FOLLOW UP: No follow-ups on file. ? ?Signed:  Santiago Bumpers, MD           04/22/2021 ? ?

## 2021-04-24 LAB — CULTURE, GROUP A STREP
MICRO NUMBER:: 13179655
SPECIMEN QUALITY:: ADEQUATE

## 2021-04-26 ENCOUNTER — Encounter: Payer: BC Managed Care – PPO | Admitting: Family Medicine

## 2021-04-29 ENCOUNTER — Telehealth: Payer: Self-pay | Admitting: Family Medicine

## 2021-04-29 NOTE — Telephone Encounter (Signed)
Her strep culture was negative so no further antibiotics are indicated. ?Follow-up appointment needed. ? ?

## 2021-04-29 NOTE — Telephone Encounter (Signed)
Please advise if pt needs f/u appt

## 2021-04-29 NOTE — Telephone Encounter (Signed)
Pt will finish abx and if still no improvement, will schedule f/u ?

## 2021-04-29 NOTE — Telephone Encounter (Signed)
FYI: ?Pt called in she was seen last Friday about sore throat. ? ?Pt said her glands are still sore. ?Pt wants to know should she get a stronger dosage with the antibiotics or schedule for another appt?  ? ?Pt cell: [phone] ?

## 2021-05-02 ENCOUNTER — Other Ambulatory Visit: Payer: Self-pay | Admitting: Family Medicine

## 2021-05-09 NOTE — Telephone Encounter (Signed)
ERROR

## 2021-05-12 ENCOUNTER — Encounter: Payer: BC Managed Care – PPO | Admitting: Family Medicine

## 2021-05-12 ENCOUNTER — Encounter: Payer: Self-pay | Admitting: Family Medicine

## 2021-05-12 NOTE — Patient Instructions (Signed)
No show

## 2021-05-12 NOTE — Progress Notes (Signed)
No show annual physical/chronic condition combined appt.  ?

## 2021-05-24 ENCOUNTER — Telehealth: Payer: Self-pay | Admitting: Family Medicine

## 2021-05-24 NOTE — Telephone Encounter (Signed)
Please advise 

## 2021-05-24 NOTE — Telephone Encounter (Signed)
Pt was no show 11/19/20, 03/08/21, 05/12/21 ? ?Please advise on final warning letter or dismissal letter. ?

## 2021-05-26 NOTE — Telephone Encounter (Signed)
If she has not had a final letter, then send her a final letter of warning.   ?If she has had final warning, then initiate dismissal. ? ?Thanks ?

## 2021-06-07 ENCOUNTER — Encounter: Payer: Self-pay | Admitting: Family Medicine

## 2021-06-07 NOTE — Telephone Encounter (Signed)
Mailing final warning letter 

## 2021-06-17 ENCOUNTER — Encounter: Payer: BC Managed Care – PPO | Admitting: Family Medicine

## 2021-07-08 ENCOUNTER — Encounter: Payer: Self-pay | Admitting: Family Medicine

## 2021-07-08 ENCOUNTER — Ambulatory Visit (INDEPENDENT_AMBULATORY_CARE_PROVIDER_SITE_OTHER): Payer: BC Managed Care – PPO | Admitting: Family Medicine

## 2021-07-08 VITALS — BP 112/83 | HR 87 | Temp 97.7°F | Ht 65.0 in | Wt 228.0 lb

## 2021-07-08 DIAGNOSIS — Z8249 Family history of ischemic heart disease and other diseases of the circulatory system: Secondary | ICD-10-CM

## 2021-07-08 DIAGNOSIS — R7309 Other abnormal glucose: Secondary | ICD-10-CM | POA: Diagnosis not present

## 2021-07-08 DIAGNOSIS — Z0000 Encounter for general adult medical examination without abnormal findings: Secondary | ICD-10-CM | POA: Diagnosis not present

## 2021-07-08 DIAGNOSIS — F419 Anxiety disorder, unspecified: Secondary | ICD-10-CM | POA: Diagnosis not present

## 2021-07-08 DIAGNOSIS — Z6837 Body mass index (BMI) 37.0-37.9, adult: Secondary | ICD-10-CM | POA: Diagnosis not present

## 2021-07-08 DIAGNOSIS — J45990 Exercise induced bronchospasm: Secondary | ICD-10-CM | POA: Diagnosis not present

## 2021-07-08 DIAGNOSIS — Z793 Long term (current) use of hormonal contraceptives: Secondary | ICD-10-CM

## 2021-07-08 LAB — COMPREHENSIVE METABOLIC PANEL
ALT: 18 U/L (ref 0–35)
AST: 15 U/L (ref 0–37)
Albumin: 4 g/dL (ref 3.5–5.2)
Alkaline Phosphatase: 103 U/L (ref 39–117)
BUN: 11 mg/dL (ref 6–23)
CO2: 27 mEq/L (ref 19–32)
Calcium: 9 mg/dL (ref 8.4–10.5)
Chloride: 102 mEq/L (ref 96–112)
Creatinine, Ser: 0.87 mg/dL (ref 0.40–1.20)
GFR: 89.17 mL/min (ref >60.00)
Glucose, Bld: 63 mg/dL — ABNORMAL LOW (ref 70–99)
Potassium: 3.8 mEq/L (ref 3.5–5.1)
Sodium: 137 mEq/L (ref 135–145)
Total Bilirubin: 1.3 mg/dL — ABNORMAL HIGH (ref 0.2–1.2)
Total Protein: 6.5 g/dL (ref 6.0–8.3)

## 2021-07-08 LAB — CBC
HCT: 44.1 % (ref 36.0–46.0)
Hemoglobin: 14.9 g/dL (ref 12.0–15.0)
MCHC: 33.8 g/dL (ref 30.0–36.0)
MCV: 89.3 fl (ref 78.0–100.0)
Platelets: 239 10*3/uL (ref 150.0–400.0)
RBC: 4.94 Mil/uL (ref 3.87–5.11)
RDW: 12.5 % (ref 11.5–15.5)
WBC: 7.6 10*3/uL (ref 4.0–10.5)

## 2021-07-08 LAB — LIPID PANEL
Cholesterol: 165 mg/dL (ref 0–200)
HDL: 60.3 mg/dL (ref >39.00)
LDL Cholesterol: 86 mg/dL (ref 0–99)
NonHDL: 105.01
Total CHOL/HDL Ratio: 3
Triglycerides: 96 mg/dL (ref 0.0–149.0)
VLDL: 19.2 mg/dL (ref 0.0–40.0)

## 2021-07-08 LAB — HEMOGLOBIN A1C: Hgb A1c MFr Bld: 5 % (ref 4.6–6.5)

## 2021-07-08 LAB — TSH: TSH: 1.23 u[IU]/mL (ref 0.35–5.50)

## 2021-07-08 MED ORDER — ESCITALOPRAM OXALATE 10 MG PO TABS: 10.0000 mg | ORAL_TABLET | Freq: Every day | ORAL | 5 refills | Status: DC

## 2021-07-08 MED ORDER — ALBUTEROL SULFATE HFA 108 (90 BASE) MCG/ACT IN AERS: 1.0000 | INHALATION_SPRAY | Freq: Four times a day (QID) | RESPIRATORY_TRACT | 5 refills | Status: DC | PRN

## 2021-07-08 MED ORDER — OZEMPIC (0.25 OR 0.5 MG/DOSE) 2 MG/3ML ~~LOC~~ SOPN: 0.5000 mg | PEN_INJECTOR | SUBCUTANEOUS | 5 refills | Status: DC

## 2021-07-08 NOTE — Patient Instructions (Signed)
No follow-ups on file.        Great to see you today.  I have refilled the medication(s) we provide.   If labs were collected, we will inform you of lab results once received either by echart message or telephone call.   - echart message- for normal results that have been seen by the patient already.   - telephone call: abnormal results or if patient has not viewed results in their echart.  Health Maintenance, Female Adopting a healthy lifestyle and getting preventive care are important in promoting health and wellness. Ask your health care provider about: The right schedule for you to have regular tests and exams. Things you can do on your own to prevent diseases and keep yourself healthy. What should I know about diet, weight, and exercise? Eat a healthy diet  Eat a diet that includes plenty of vegetables, fruits, low-fat dairy products, and lean protein. Do not eat a lot of foods that are high in solid fats, added sugars, or sodium. Maintain a healthy weight Body mass index (BMI) is used to identify weight problems. It estimates body fat based on height and weight. Your health care provider can help determine your BMI and help you achieve or maintain a healthy weight. Get regular exercise Get regular exercise. This is one of the most important things you can do for your health. Most adults should: Exercise for at least 150 minutes each week. The exercise should increase your heart rate and make you sweat (moderate-intensity exercise). Do strengthening exercises at least twice a week. This is in addition to the moderate-intensity exercise. Spend less time sitting. Even light physical activity can be beneficial. Watch cholesterol and blood lipids Have your blood tested for lipids and cholesterol at 31 years of age, then have this test every 5 years. Have your cholesterol levels checked more often if: Your lipid or cholesterol levels are high. You are older than 31 years of  age. You are at high risk for heart disease. What should I know about cancer screening? Depending on your health history and family history, you may need to have cancer screening at various ages. This may include screening for: Breast cancer. Cervical cancer. Colorectal cancer. Skin cancer. Lung cancer. What should I know about heart disease, diabetes, and high blood pressure? Blood pressure and heart disease High blood pressure causes heart disease and increases the risk of stroke. This is more likely to develop in people who have high blood pressure readings or are overweight. Have your blood pressure checked: Every 3-5 years if you are 18-39 years of age. Every year if you are 40 years old or older. Diabetes Have regular diabetes screenings. This checks your fasting blood sugar level. Have the screening done: Once every three years after age 40 if you are at a normal weight and have a low risk for diabetes. More often and at a younger age if you are overweight or have a high risk for diabetes. What should I know about preventing infection? Hepatitis B If you have a higher risk for hepatitis B, you should be screened for this virus. Talk with your health care provider to find out if you are at risk for hepatitis B infection. Hepatitis C Testing is recommended for: Everyone born from 1945 through 1965. Anyone with known risk factors for hepatitis C. Sexually transmitted infections (STIs) Get screened for STIs, including gonorrhea and chlamydia, if: You are sexually active and are younger than 31 years of age. You are   older than 31 years of age and your health care provider tells you that you are at risk for this type of infection. Your sexual activity has changed since you were last screened, and you are at increased risk for chlamydia or gonorrhea. Ask your health care provider if you are at risk. Ask your health care provider about whether you are at high risk for HIV. Your health  care provider may recommend a prescription medicine to help prevent HIV infection. If you choose to take medicine to prevent HIV, you should first get tested for HIV. You should then be tested every 3 months for as long as you are taking the medicine. Pregnancy If you are about to stop having your period (premenopausal) and you may become pregnant, seek counseling before you get pregnant. Take 400 to 800 micrograms (mcg) of folic acid every day if you become pregnant. Ask for birth control (contraception) if you want to prevent pregnancy. Osteoporosis and menopause Osteoporosis is a disease in which the bones lose minerals and strength with aging. This can result in bone fractures. If you are 65 years old or older, or if you are at risk for osteoporosis and fractures, ask your health care provider if you should: Be screened for bone loss. Take a calcium or vitamin D supplement to lower your risk of fractures. Be given hormone replacement therapy (HRT) to treat symptoms of menopause. Follow these instructions at home: Alcohol use Do not drink alcohol if: Your health care provider tells you not to drink. You are pregnant, may be pregnant, or are planning to become pregnant. If you drink alcohol: Limit how much you have to: 0-1 drink a day. Know how much alcohol is in your drink. In the [place] one drink equals one 12 oz bottle of beer (355 mL), one 5 oz glass of wine (148 mL), or one 1 oz glass of hard liquor (44 mL). Lifestyle Do not use any products that contain nicotine or tobacco. These products include cigarettes, chewing tobacco, and vaping devices, such as e-cigarettes. If you need help quitting, ask your health care provider. Do not use street drugs. Do not share needles. Ask your health care provider for help if you need support or information about quitting drugs. General instructions Schedule regular health, dental, and eye exams. Stay current with your vaccines. Tell your health  care provider if: You often feel depressed. You have ever been abused or do not feel safe at home. Summary Adopting a healthy lifestyle and getting preventive care are important in promoting health and wellness. Follow your health care provider's instructions about healthy diet, exercising, and getting tested or screened for diseases. Follow your health care provider's instructions on monitoring your cholesterol and blood pressure. This information is not intended to replace advice given to you by your health care provider. Make sure you discuss any questions you have with your health care provider. Document Revised: 06/07/2020 Document Reviewed: 06/07/2020 Elsevier Patient Education  2023 [employer]  

## 2021-07-08 NOTE — Progress Notes (Signed)
Patient ID: Julie Cummings, female  DOB: [birth year], 31 y.o.   MRN: [id number] Patient Care Team    Relationship Specialty Notifications Start End  Ma Hillock, DO PCP - General Family Medicine  02/04/20   Christin Fudge, CNM Midwife Certified Nurse Midwife  02/04/20    Comment: [facility]    Chief Complaint  Patient presents with   Annual Exam    Pt is not fasting     Subjective:  Julie Cummings is a 31 y.o.  Female  present for [place] All past medical history, surgical history, allergies, family history, immunizations, medications and social history were updated in the electronic medical record today. All recent labs, ED visits and hospitalizations within the last year were reviewed.  Health maintenance:  Colonoscopy: no fhx. Screen at 45 Mammogram: no fhx. Screen at 40 Cervical cancer screening: last pap: 07/2018, completed by: [facility] Immunizations: tdap UTD 2017, Influenza (encouraged yearly),covid series declined Infectious disease screening: HIV completed, Hep C completed DEXA:routine screen Assistive device: none Oxygen OIT:GPQD Patient has a Dental home. Hospitalizations/ED visits: reviewed  anxiety/palpitations: Patient feels the Lexapro has been helpful  for her anxiety     Weight gain/weight loss counseling: 260> 256> 247!> 228 Patient reports she has been exercising- 6x a week now! She has been making better food choices She just finished a 75 challenge diet/exercise.  She is meal prepping now.  She is now only drinking 3 oz of diet coke a day.  She stopped snacking  She has increased her water consumption. Ozempic caused mild nausea. Tolerating.      07/08/2021    9:00 AM 04/22/2021    9:10 AM 12/07/2020    1:29 PM 10/22/2020    3:33 PM 03/18/2020    8:33 AM  Depression screen PHQ 2/9  Decreased Interest 0 0 0 0 0  Down, Depressed, Hopeless 0 0 0 0 0  PHQ - 2 Score 0 0 0 0 0  Altered sleeping 0   0 1  Tired, decreased  energy '1   1 1  ' Change in appetite    3 0  Feeling bad or failure about yourself  0   0 0  Trouble concentrating 0   0 0  Moving slowly or fidgety/restless 0   0 0  Suicidal thoughts 0   0 0  PHQ-9 Score '1   4 2      ' 07/08/2021    9:07 AM 10/22/2020    3:34 PM 03/18/2020    8:33 AM  GAD 7 : Generalized Anxiety Score  Nervous, Anxious, on Edge 1 0 1  Control/stop worrying 0 0 0  Worry too much - different things 1 0 1  Trouble relaxing 0 1 0  Restless 0 0 0  Easily annoyed or irritable '1 2 1  ' Afraid - awful might happen 0 0   Total GAD 7 Score 3 3    Immunization History  Administered Date(s) Administered   Influenza,inj,Quad PF,6+ Mos 11/05/2012   MMR 12/02/2010   PPD Test 09/14/2019   Rho (D) Immune Globulin 12/02/2010, 09/24/2012, 11/19/2012   Tdap 12/02/2010, 11/20/2012, 05/20/2015    Past Medical History:  Diagnosis Date   Allergy    Chronic headaches    Depression    Gall stones    GERD (gastroesophageal reflux disease)    History of UTI    Palpitations 02/04/2020   Preeclampsia    Rh negative state in antepartum period 10/07/2014  B-   Rhogam after 28wks: 04/27/2015     Allergies  Allergen Reactions   Morphine And Related Other (See Comments) and Palpitations    Racing heart; when patient had gallbladder taken out in December 2012, MD told patient that her heart was racing and not to take Morphine again. Other reaction(s): Other (See Comments), Unknown Racing heart; when patient had gallbladder taken out in December 2012, MD told patient that her heart was racing and not to take Morphine again.   Morphine Palpitations   Past Surgical History:  Procedure Laterality Date   CHOLECYSTECTOMY  12/2010   ERCP     TONSILLECTOMY  1977   Family History  Problem Relation Age of Onset   Hypertension Sister    Arthritis Sister    Depression Sister    Miscarriages / Stillbirths Sister    CAD Maternal Grandfather    Diabetes Maternal Grandfather    Stroke Maternal  Grandfather    Heart attack Maternal Grandfather    Heart disease Maternal Grandfather    Hearing loss Maternal Grandfather    Hyperlipidemia Maternal Grandfather    Hypertension Maternal Grandfather    Diabetes Paternal Grandfather    Heart disease Paternal Grandfather    Stroke Paternal Grandfather    Heart attack Paternal Grandfather    Diabetes Mother    Arthritis Mother    Hearing loss Mother    Hypertension Mother    Diabetes Father    Thyroid disease Maternal Grandmother    Heart disease Paternal 51 / Stillbirths Sister    Hypertension Other        grandparents   Asthma Son    Asthma Son    Social History   Social History Narrative   Marital status/children/pets: Married, 3 children   Education/employment: Secretary/administrator, Manufacturing engineer:      - wears seatbelt: Yes     - Feels safe in their relationships: Yes    Allergies as of 07/08/2021       Reactions   Morphine And Related Other (See Comments), Palpitations   Racing heart; when patient had gallbladder taken out in December 2012, MD told patient that her heart was racing and not to take Morphine again. Other reaction(s): Other (See Comments), Unknown Racing heart; when patient had gallbladder taken out in December 2012, MD told patient that her heart was racing and not to take Morphine again.   Morphine Palpitations        Medication List        Accurate as of July 08, 2021  9:37 AM. If you have any questions, ask your nurse or doctor.          STOP taking these medications    buPROPion 150 MG 24 hr tablet Commonly known as: WELLBUTRIN XL Stopped by: Howard Pouch, DO   OVER THE COUNTER MEDICATION Stopped by: Howard Pouch, DO   Ozempic (0.25 or 0.5 MG/DOSE) 2 MG/1.5ML Sopn Generic drug: Semaglutide(0.25 or 0.5MG/DOS) Replaced by: Ozempic (0.25 or 0.5 MG/DOSE) 2 MG/3ML Sopn Stopped by: Howard Pouch, DO       TAKE these medications    albuterol 108 (90 Base)  MCG/ACT inhaler Commonly known as: VENTOLIN HFA Inhale 1-2 puffs into the lungs every 6 (six) hours as needed.   escitalopram 10 MG tablet Commonly known as: LEXAPRO Take 1 tablet (10 mg total) by mouth daily.   Lo Loestrin Fe 1 MG-10 MCG / 10 MCG tablet Generic drug: Norethindrone-Ethinyl Estradiol-Fe  Biphas TAKE 1 TABLET BY MOUTH EVERY DAY   Ozempic (0.25 or 0.5 MG/DOSE) 2 MG/3ML Sopn Generic drug: Semaglutide(0.25 or 0.5MG/DOS) Inject 0.5 mg into the skin once a week. Replaces: Ozempic (0.25 or 0.5 MG/DOSE) 2 MG/1.5ML Sopn Started by: Howard Pouch, DO        All past medical history, surgical history, allergies, family history, immunizations andmedications were updated in the EMR today and reviewed under the history and medication portions of their EMR.      No results found.   ROS 14 pt review of systems performed and negative (unless mentioned in an HPI)  Objective: BP 112/83   Pulse 87   Temp 97.7 F (36.5 C) (Oral)   Ht '5\' 5"'  (1.651 m)   Wt 228 lb (103.4 kg)   SpO2 96%   BMI 37.94 kg/m  Physical Exam Vitals and nursing note reviewed.  Constitutional:      General: She is not in acute distress.    Appearance: Normal appearance. She is obese. She is not ill-appearing or toxic-appearing.  HENT:     Head: Normocephalic and atraumatic.     Right Ear: Tympanic membrane, ear canal and external ear normal. There is no impacted cerumen.     Left Ear: Tympanic membrane, ear canal and external ear normal. There is no impacted cerumen.     Nose: No congestion or rhinorrhea.     Mouth/Throat:     Mouth: Mucous membranes are moist.     Pharynx: Oropharynx is clear. No oropharyngeal exudate or posterior oropharyngeal erythema.  Eyes:     General: No scleral icterus.       Right eye: No discharge.        Left eye: No discharge.     Extraocular Movements: Extraocular movements intact.     Conjunctiva/sclera: Conjunctivae normal.     Pupils: Pupils are equal, round,  and reactive to light.  Cardiovascular:     Rate and Rhythm: Normal rate and regular rhythm.     Pulses: Normal pulses.     Heart sounds: Normal heart sounds. No murmur heard.    No friction rub. No gallop.  Pulmonary:     Effort: Pulmonary effort is normal. No respiratory distress.     Breath sounds: Normal breath sounds. No stridor. No wheezing, rhonchi or rales.  Chest:     Chest wall: No tenderness.  Abdominal:     General: Abdomen is flat. Bowel sounds are normal. There is no distension.     Palpations: Abdomen is soft. There is no mass.     Tenderness: There is no abdominal tenderness. There is no right CVA tenderness, left CVA tenderness, guarding or rebound.     Hernia: No hernia is present.  Musculoskeletal:        General: No swelling, tenderness or deformity. Normal range of motion.     Cervical back: Normal range of motion and neck supple. No rigidity or tenderness.     Right lower leg: No edema.     Left lower leg: No edema.  Lymphadenopathy:     Cervical: No cervical adenopathy.  Skin:    General: Skin is warm and dry.     Coloration: Skin is not jaundiced or pale.     Findings: No bruising, erythema, lesion or rash.  Neurological:     General: No focal deficit present.     Mental Status: She is alert and oriented to person, place, and time. Mental status is at baseline.  Cranial Nerves: No cranial nerve deficit.     Sensory: No sensory deficit.     Motor: No weakness.     Coordination: Coordination normal.     Gait: Gait normal.     Deep Tendon Reflexes: Reflexes normal.  Psychiatric:        Mood and Affect: Mood normal.        Behavior: Behavior normal.        Thought Content: Thought content normal.        Judgment: Judgment normal.        No results found.  Assessment/plan: LENIYAH MARTELL is a 31 y.o. 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 (has been 14d d/t nause> encouraged her 10d then slowly taper to q 7 day Weight loss counseling, encounter for/Morbid obesity (HCC)/FHX heart disease/stroke She was encouraged to make weight loss goals in 4-6 mos increments and final weight loss goal (170).  Patient was counseled on exercise, calorie counting, weight loss -Patient was provided with online resources for: Weekly net calorie calculator.  Applications for calorie counting.  Patient was advised to ensure she is taking in adequate nutrition daily by meeting calorie goals -Stop all diet soft drinks.   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 Screen at 45 Mammogram: no fhx. Screen at 40 Cervical cancer screening: last pap: 07/2018, completed by: [facility] Immunizations: tdap UTD 2017, Influenza (encouraged yearly),covid series declined Infectious disease screening: HIV completed, Hep C completed DEXA:routine screen Patient was encouraged to exercise greater than 150 minutes a week. Patient was encouraged to choose a diet filled with fresh fruits and vegetables, and lean meats. AVS provided to patient today for education/recommendation on gender specific health and safety maintenance.   Orders Placed This Encounter  Procedures   CBC   Comp Met (CMET)   TSH   Lipid panel   Hemoglobin A1c    Orders Placed This Encounter  Procedures   CBC   Comp Met (CMET)   TSH   Lipid panel   Hemoglobin A1c   Meds ordered this encounter  Medications   Semaglutide,0.25 or 0.5MG/DOS, (OZEMPIC, 0.25 OR 0.5 MG/DOSE,) 2  MG/3ML SOPN    Sig: Inject 0.5 mg into the skin once a week.    Dispense:  3 mL    Refill:  5   escitalopram (LEXAPRO) 10 MG tablet    Sig: Take 1 tablet (10 mg total) by mouth daily.    Dispense:  30 tablet    Refill:  5   albuterol (VENTOLIN HFA) 108 (90 Base) MCG/ACT inhaler    Sig: Inhale 1-2 puffs into the lungs every 6 (six) hours as needed.    Dispense:  18 g    Refill:  5   Referral Orders  No referral(s) requested today     Electronically signed by: Howard Pouch, [place]

## 2021-07-19 ENCOUNTER — Telehealth (INDEPENDENT_AMBULATORY_CARE_PROVIDER_SITE_OTHER): Payer: BC Managed Care – PPO | Admitting: Family Medicine

## 2021-07-19 ENCOUNTER — Encounter: Payer: Self-pay | Admitting: Family Medicine

## 2021-07-19 VITALS — Wt 223.0 lb

## 2021-07-19 DIAGNOSIS — H1033 Unspecified acute conjunctivitis, bilateral: Secondary | ICD-10-CM | POA: Diagnosis not present

## 2021-07-19 MED ORDER — POLYMYXIN B-TRIMETHOPRIM 10000-0.1 UNIT/ML-% OP SOLN: 1.0000 [drp] | OPHTHALMIC | 0 refills | Status: AC

## 2021-07-19 NOTE — Progress Notes (Signed)
VIRTUAL VISIT VIA VIDEO  I connected with Julie Cummings on 07/19/21 at 11:00 AM EDT by a video enabled telemedicine application and verified that I am speaking with the correct person using two identifiers. Location patient: Home Location provider: [facility], Office Persons participating in the virtual visit: Patient, Dr. Claiborne Billings and Les Pou, CMA  I discussed the limitations of evaluation and management by telemedicine and the availability of in person appointments. The patient expressed understanding and agreed to proceed.     Julie Cummings , [birth year], 31 y.o., female MRN: [id number] Patient Care Team    Relationship Specialty Notifications Start End  Natalia Leatherwood, DO PCP - General Family Medicine  02/04/20   Jacklyn Shell, CNM Midwife Certified Nurse Midwife  02/04/20    Comment: [facility]    Chief Complaint  Patient presents with   Eye Drainage    Exposed to pink eye; pt c/o eye redness, drainage x 4 days; taking doxy for strep      Subjective: Pt presents for an OV with complaints of bilateral eye irritation with redness for approximately 5-6 days.  She has had mild drainage, not much.  She is taking doxycycline for strep infection.  She exposed to a family member who had pinkeye just prior to onset of symptoms.  She is taking Claritin for allergies.  She is not a contact wearer.  She started her daughters pink eyedrops on Friday, but does not feel the redness is improving.     07/08/2021    9:00 AM 04/22/2021    9:10 AM 12/07/2020    1:29 PM 10/22/2020    3:33 PM 03/18/2020    8:33 AM  Depression screen PHQ 2/9  Decreased Interest 0 0 0 0 0  Down, Depressed, Hopeless 0 0 0 0 0  PHQ - 2 Score 0 0 0 0 0  Altered sleeping 0   0 1  Tired, decreased energy 1   1 1   Change in appetite    3 0  Feeling bad or failure about yourself  0   0 0  Trouble concentrating 0   0 0  Moving slowly or fidgety/restless 0   0 0  Suicidal thoughts 0    0 0  PHQ-9 Score 1   4 2     Allergies  Allergen Reactions   Morphine And Related Other (See Comments) and Palpitations    Racing heart; when patient had gallbladder taken out in December 2012, MD told patient that her heart was racing and not to take Morphine again. Other reaction(s): Other (See Comments), Unknown Racing heart; when patient had gallbladder taken out in December 2012, MD told patient that her heart was racing and not to take Morphine again.   Morphine Palpitations   Social History   Social History Narrative   Marital status/children/pets: Married, 3 children   Education/employment: 06-18-1987, 06-18-1987:      - wears seatbelt: Yes     - Feels safe in their relationships: Yes   Past Medical History:  Diagnosis Date   Allergy    Chronic headaches    Depression    Gall stones    GERD (gastroesophageal reflux disease)    History of UTI    Palpitations 02/04/2020   Preeclampsia    Rh negative state in antepartum period 10/07/2014   B-   Rhogam after 28wks: 04/27/2015     Past Surgical History:  Procedure Laterality Date   CHOLECYSTECTOMY  12/2010   ERCP     TONSILLECTOMY  1977   Family History  Problem Relation Age of Onset   Hypertension Sister    Arthritis Sister    Depression Sister    Miscarriages / Stillbirths Sister    CAD Maternal Grandfather    Diabetes Maternal Grandfather    Stroke Maternal Grandfather    Heart attack Maternal Grandfather    Heart disease Maternal Grandfather    Hearing loss Maternal Grandfather    Hyperlipidemia Maternal Grandfather    Hypertension Maternal Grandfather    Diabetes Paternal Grandfather    Heart disease Paternal Grandfather    Stroke Paternal Grandfather    Heart attack Paternal Grandfather    Diabetes Mother    Arthritis Mother    Hearing loss Mother    Hypertension Mother    Diabetes Father    Thyroid disease Maternal Grandmother    Heart disease Paternal Grandmother    Miscarriages /  Stillbirths Sister    Hypertension Other        grandparents   Asthma Son    Asthma Son    Allergies as of 07/19/2021       Reactions   Morphine And Related Other (See Comments), Palpitations   Racing heart; when patient had gallbladder taken out in December 2012, MD told patient that her heart was racing and not to take Morphine again. Other reaction(s): Other (See Comments), Unknown Racing heart; when patient had gallbladder taken out in December 2012, MD told patient that her heart was racing and not to take Morphine again.   Morphine Palpitations        Medication List        Accurate as of July 19, 2021 11:27 AM. If you have any questions, ask your nurse or doctor.          albuterol 108 (90 Base) MCG/ACT inhaler Commonly known as: VENTOLIN HFA Inhale 1-2 puffs into the lungs every 6 (six) hours as needed.   escitalopram 10 MG tablet Commonly known as: LEXAPRO Take 1 tablet (10 mg total) by mouth daily.   Lo Loestrin Fe 1 MG-10 MCG / 10 MCG tablet Generic drug: Norethindrone-Ethinyl Estradiol-Fe Biphas TAKE 1 TABLET BY MOUTH EVERY DAY   Ozempic (0.25 or 0.5 MG/DOSE) 2 MG/3ML Sopn Generic drug: Semaglutide(0.25 or 0.5MG /DOS) Inject 0.5 mg into the skin once a week.   trimethoprim-polymyxin b ophthalmic solution Commonly known as: POLYTRIM Place 1 drop into both eyes every 4 (four) hours for 7 days. Started by: Felix Pacini, DO        All past medical history, surgical history, allergies, family history, immunizations andmedications were updated in the EMR today and reviewed under the history and medication portions of their EMR.     ROS Negative, with the exception of above mentioned in HPI   Objective:  Wt 223 lb (101.2 kg)   BMI 37.11 kg/m  Body mass index is 37.11 kg/m. Physical Exam Gen: Afebrile. No acute distress.  HENT: AT. [place].  Eyes:Pupils Equal Round Reactive to light, Extraocular movements intact,  Conjunctiva with very mild bilateral  redness, no discharge or icterus.  No light sensitivity reported. Neuro: PERLA. EOMi. Alert.  Ranted x3 Psych: Normal affect, dress and demeanor. Normal speech. Normal thought content and judgment.   No results found. No results found. No results found for this or any previous visit (from the past 24 hour(s)).  Assessment/Plan: Julie Cummings is a 30  y.o. female present for OV for  Acute conjunctivitis of both eyes, unspecified acute conjunctivitis type Discussed different causes of conjunctivitis today which included bacterial, viral and allergic conjunctivitis.  She does have bilateral conjunctivitis making viral or allergic most likely cause of her symptoms, however she does report the right eye is worse than the left and she also has already started prescription eyedrops for bacterial conjunctivitis therefore making it more difficult to settle on correct diagnosis. Encouraged her to continue Claritin, and start Flonase nasal spray OTC daily. We will treat with Polytrim ophthalmic eyedrops x7 days Follow-up as needed  Reviewed expectations re: course of current medical issues. Discussed self-management of symptoms. Outlined signs and symptoms indicating need for more acute intervention. Patient verbalized understanding and all questions were answered. Patient received an After-Visit Summary.    No orders of the defined types were placed in this encounter.  Meds ordered this encounter  Medications   trimethoprim-polymyxin b (POLYTRIM) ophthalmic solution    Sig: Place 1 drop into both eyes every 4 (four) hours for 7 days.    Dispense:  10 mL    Refill:  0   Referral Orders  No referral(s) requested today     Note is dictated utilizing voice recognition software. Although note has been proof read prior to signing, occasional typographical errors still can be missed. If any questions arise, please do not hesitate to call for verification.   electronically signed by:  Felix Pacini, DO  [facility] - [place]

## 2021-10-01 ENCOUNTER — Other Ambulatory Visit: Payer: Self-pay | Admitting: Adult Health

## 2021-10-07 ENCOUNTER — Encounter: Payer: Self-pay | Admitting: Family Medicine

## 2021-11-25 ENCOUNTER — Other Ambulatory Visit: Payer: Self-pay | Admitting: Adult Health

## 2021-11-28 ENCOUNTER — Encounter: Payer: Self-pay | Admitting: Family Medicine

## 2021-11-28 ENCOUNTER — Ambulatory Visit (INDEPENDENT_AMBULATORY_CARE_PROVIDER_SITE_OTHER): Payer: BC Managed Care – PPO | Admitting: Family Medicine

## 2021-11-28 VITALS — BP 119/78 | HR 75 | Temp 98.0°F | Wt 221.6 lb

## 2021-11-28 DIAGNOSIS — F419 Anxiety disorder, unspecified: Secondary | ICD-10-CM

## 2021-11-28 MED ORDER — ESCITALOPRAM OXALATE 20 MG PO TABS
20.0000 mg | ORAL_TABLET | Freq: Every day | ORAL | 1 refills | Status: DC
Start: 2021-11-28 — End: 2022-05-25

## 2021-11-28 NOTE — Patient Instructions (Signed)
Return for has physical in June.        Great to see you today.  I have refilled the medication(s) we provide.   If labs were collected, we will inform you of lab results once received either by echart message or telephone call.   - echart message- for normal results that have been seen by the patient already.   - telephone call: abnormal results or if patient has not viewed results in their echart.

## 2021-11-28 NOTE — Progress Notes (Signed)
Patient ID: Julie Cummings, female  DOB: 11-05-1990, 31 y.o.   MRN: [id number] Patient Care Team    Relationship Specialty Notifications Start End  Ma Hillock, DO PCP - General Family Medicine  02/04/20   Christin Fudge, CNM Midwife Certified Nurse Midwife  02/04/20    Comment: ob/gyn- family tree    Chief Complaint  Patient presents with   Anxiety    Pt is not fasting    Subjective:  Julie Cummings is a 31 y.o.  Female  present for [facility] All past medical history, surgical history, allergies, family history, immunizations, medications and social history were updated in the electronic medical record today. All recent labs, ED visits and hospitalizations within the last year were reviewed.  anxiety/palpitations: Patient feels the Lexapro has been helpful  for her anxiety.  Today she states she feels she may need an increase on the dose from Lexapro 10 mg daily.   Weight gain/weight loss counseling: 260> 256> 247!> 228> 221 Patient reports she has been exercising- 6x a week now She has been making better food choices      11/28/2021    8:53 AM 07/08/2021    9:00 AM 04/22/2021    9:10 AM 12/07/2020    1:29 PM 10/22/2020    3:33 PM  Depression screen PHQ 2/9  Decreased Interest 1 0 0 0 0  Down, Depressed, Hopeless 0 0 0 0 0  PHQ - 2 Score 1 0 0 0 0  Altered sleeping 1 0   0  Tired, decreased energy _0 Change in appetite 0    3  Feeling bad or failure about yourself  0 0   0  Trouble concentrating 0 0   0  Moving slowly or fidgety/restless 0 0   0  Suicidal thoughts 0 0   0  PHQ-9 Score _1 11/28/2021    8:54 AM 07/08/2021    9:07 AM 10/22/2020    3:34 PM 03/18/2020    8:33 AM  GAD 7 : Generalized Anxiety Score  Nervous, Anxious, on Edge 2 1 0 1  Control/stop worrying 1 0 0 0  Worry too much - different things 1 1 0 1  Trouble relaxing 1 0 1 0  Restless 0 0 0 0  Easily annoyed or irritable _2 Afraid - awful might happen 0 0 0    Total GAD 7 Score _3 Immunization History  Administered Date(s) Administered   Influenza,inj,Quad PF,6+ Mos 11/05/2012   MMR 12/02/2010   PPD Test 09/14/2019   Rho (D) Immune Globulin 12/02/2010, 09/24/2012, 11/19/2012   Tdap 12/02/2010, 11/20/2012, 05/20/2015    Past Medical History:  Diagnosis Date   Allergy    Chronic headaches    Depression    Gall stones    GERD (gastroesophageal reflux disease)    History of UTI    Palpitations 02/04/2020   Preeclampsia    Rh negative state in antepartum period 10/07/2014   B-   Rhogam after 28wks: 04/27/2015     Allergies  Allergen Reactions   Morphine And Related Other (See Comments) and Palpitations    Racing heart; when patient had gallbladder taken out in December 2012, MD told patient that her heart was racing and not to take Morphine again. Other reaction(s): Other (See Comments), Unknown Racing heart; when patient had gallbladder taken out in  December 2012, MD told patient that her heart was racing and not to take Morphine again.   Morphine Palpitations   Past Surgical History:  Procedure Laterality Date   CHOLECYSTECTOMY  12/2010   ERCP     TONSILLECTOMY  1977   Family History  Problem Relation Age of Onset   Hypertension Sister    Arthritis Sister    Depression Sister    Miscarriages / Stillbirths Sister    CAD Maternal Grandfather    Diabetes Maternal Grandfather    Stroke Maternal Grandfather    Heart attack Maternal Grandfather    Heart disease Maternal Grandfather    Hearing loss Maternal Grandfather    Hyperlipidemia Maternal Grandfather    Hypertension Maternal Grandfather    Diabetes Paternal Grandfather    Heart disease Paternal Grandfather    Stroke Paternal Grandfather    Heart attack Paternal Grandfather    Diabetes Mother    Arthritis Mother    Hearing loss Mother    Hypertension Mother    Diabetes Father    Thyroid disease Maternal Grandmother    Heart disease Paternal 63 / Stillbirths Sister    Hypertension Other        grandparents   Asthma Son    Asthma Son    Social History   Social History Narrative   Marital status/children/pets: Married, 3 children   Education/employment: Secretary/administrator, Manufacturing engineer:      - wears seatbelt: Yes     - Feels safe in their relationships: Yes    Allergies as of 11/28/2021       Reactions   Morphine And Related Other (See Comments), Palpitations   Racing heart; when patient had gallbladder taken out in December 2012, MD told patient that her heart was racing and not to take Morphine again. Other reaction(s): Other (See Comments), Unknown Racing heart; when patient had gallbladder taken out in December 2012, MD told patient that her heart was racing and not to take Morphine again.   Morphine Palpitations        Medication List        Accurate as of November 28, 2021  8:57 AM. If you have any questions, ask your nurse or doctor.          STOP taking these medications    Ozempic (0.25 or 0.5 MG/DOSE) 2 MG/3ML Sopn Generic drug: Semaglutide(0.25 or 0.5MG/DOS) Stopped by: Howard Pouch, DO       TAKE these medications    albuterol 108 (90 Base) MCG/ACT inhaler Commonly known as: VENTOLIN HFA Inhale 1-2 puffs into the lungs every 6 (six) hours as needed.   escitalopram 20 MG tablet Commonly known as: LEXAPRO Take 1 tablet (20 mg total) by mouth daily. What changed:  medication strength how much to take Changed by: Howard Pouch, DO   Lo Loestrin Fe 1 MG-10 MCG / 10 MCG tablet Generic drug: Norethindrone-Ethinyl Estradiol-Fe Biphas TAKE 1 TABLET BY MOUTH EVERY DAY        All past medical history, surgical history, allergies, family history, immunizations andmedications were updated in the EMR today and reviewed under the history and medication portions of their EMR.      No results found.   ROS 14 pt review of systems performed and negative (unless mentioned in  an HPI)  Objective: BP 119/78   Pulse 75   Temp 98 F (36.7 C)   Wt 221 lb 9.6 oz (100.5 kg)  SpO2 99%   BMI 36.88 kg/m  Physical Exam Vitals and nursing note reviewed.  Constitutional:      General: She is not in acute distress.    Appearance: Normal appearance. She is not ill-appearing, toxic-appearing or diaphoretic.  HENT:     Head: Normocephalic and atraumatic.  Eyes:     General: No scleral icterus.       Right eye: No discharge.        Left eye: No discharge.     Extraocular Movements: Extraocular movements intact.     Conjunctiva/sclera: Conjunctivae normal.     Pupils: Pupils are equal, round, and reactive to light.  Cardiovascular:     Rate and Rhythm: Normal rate and regular rhythm.  Pulmonary:     Effort: Pulmonary effort is normal. No respiratory distress.     Breath sounds: Normal breath sounds. No wheezing, rhonchi or rales.  Musculoskeletal:     Cervical back: No tenderness.     Right lower leg: No edema.     Left lower leg: No edema.  Skin:    General: Skin is warm and dry.     Coloration: Skin is not jaundiced or pale.     Findings: No erythema or rash.  Neurological:     Mental Status: She is alert and oriented to person, place, and time. Mental status is at baseline.     Motor: No weakness.     Gait: Gait normal.  Psychiatric:        Mood and Affect: Mood normal.        Behavior: Behavior normal.        Thought Content: Thought content normal.        Judgment: Judgment normal.       No results found.  Assessment/plan: Julie Cummings is a 31 y.o. female present for CPE/CMC Anxiety Could use more coverage.  Not as well-controlled. Increase Lexapro 10 mg to 20 mg. 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 Will discontinue today.  She will continue exercising and following diet established over the last year.   She has done great    No orders of the defined types were placed in this encounter.   No orders of the defined types were placed in this encounter.  Meds ordered this encounter  Medications   escitalopram (LEXAPRO) 20 MG tablet    Sig: Take 1 tablet (20 mg total) by mouth daily.    Dispense:  90 tablet    Refill:  1   Referral Orders  No referral(s) requested today     Electronically signed by: Howard Pouch, [place]

## 2021-12-26 ENCOUNTER — Other Ambulatory Visit: Payer: Self-pay | Admitting: Adult Health

## 2022-01-21 ENCOUNTER — Other Ambulatory Visit: Payer: Self-pay | Admitting: Family Medicine

## 2022-01-31 ENCOUNTER — Other Ambulatory Visit: Payer: Self-pay | Admitting: Adult Health

## 2022-02-25 ENCOUNTER — Other Ambulatory Visit: Payer: Self-pay | Admitting: Adult Health

## 2022-03-20 ENCOUNTER — Telehealth: Payer: BC Managed Care – PPO | Admitting: Physician Assistant

## 2022-03-20 DIAGNOSIS — B9789 Other viral agents as the cause of diseases classified elsewhere: Secondary | ICD-10-CM | POA: Diagnosis not present

## 2022-03-20 DIAGNOSIS — J019 Acute sinusitis, unspecified: Secondary | ICD-10-CM

## 2022-03-20 MED ORDER — IPRATROPIUM BROMIDE 0.03 % NA SOLN: 2.0000 | Freq: Two times a day (BID) | NASAL | 0 refills | Status: DC

## 2022-03-20 NOTE — Progress Notes (Signed)
E-Visit for Sinus Problems  We are sorry that you are not feeling well.  Here is how we plan to help!  Based on what you have shared with me it looks like you have sinusitis.  Sinusitis is inflammation and infection in the sinus cavities of the head.  Based on your presentation I believe you most likely have Acute Viral Sinusitis.This is an infection most likely caused by a virus. There is not specific treatment for viral sinusitis other than to help you with the symptoms until the infection runs its course.  You may use an oral decongestant such as Mucinex D or if you have glaucoma or high blood pressure use plain Mucinex. Saline nasal spray help and can safely be used as often as needed for congestion, I have prescribed: Ipratropium Bromide nasal spray 0.03% 2 sprays in eah nostril 2-3 times a day  Some authorities believe that zinc sprays or the use of Echinacea may shorten the course of your symptoms.  Sinus infections are not as easily transmitted as other respiratory infection, however we still recommend that you avoid close contact with loved ones, especially the very young and elderly.  Remember to wash your hands thoroughly throughout the day as this is the number one way to prevent the spread of infection!  Home Care: Only take medications as instructed by your medical team. Do not take these medications with alcohol. A steam or ultrasonic humidifier can help congestion.  You can place a towel over your head and breathe in the steam from hot water coming from a faucet. Avoid close contacts especially the very young and the elderly. Cover your mouth when you cough or sneeze. Always remember to wash your hands.  Get Help Right Away If: You develop worsening fever or sinus pain. You develop a severe head ache or visual changes. Your symptoms persist after you have completed your treatment plan.  Make sure you Understand these instructions. Will watch your condition. Will get help  right away if you are not doing well or get worse.   Thank you for choosing an e-visit.  Your e-visit answers were reviewed by a board certified advanced clinical practitioner to complete your personal care plan. Depending upon the condition, your plan could have included both over the counter or prescription medications.  Please review your pharmacy choice. Make sure the pharmacy is open so you can pick up prescription now. If there is a problem, you may contact your provider through [employer] and have the prescription routed to another pharmacy.  Your safety is important to [name]. If you have drug allergies check your prescription carefully.   For the next 24 hours you can use MyChart to ask questions about today's visit, request a non-urgent call back, or ask for a work or school excuse. You will get an email in the next two days asking about your experience. I hope that your e-visit has been valuable and will speed your recovery.

## 2022-03-20 NOTE — Progress Notes (Signed)
I have spent 5 minutes in review of e-visit questionnaire, review and updating patient chart, medical decision making and response to patient.   Coni Homesley Cody Jeffie Spivack, PA-C    

## 2022-03-23 ENCOUNTER — Other Ambulatory Visit: Payer: Self-pay | Admitting: Adult Health

## 2022-03-26 ENCOUNTER — Telehealth: Payer: BC Managed Care – PPO | Admitting: Physician Assistant

## 2022-03-26 DIAGNOSIS — J069 Acute upper respiratory infection, unspecified: Secondary | ICD-10-CM

## 2022-03-26 MED ORDER — BENZONATATE 100 MG PO CAPS: 100.0000 mg | ORAL_CAPSULE | Freq: Three times a day (TID) | ORAL | 0 refills | Status: DC | PRN

## 2022-03-26 MED ORDER — FLUTICASONE PROPIONATE 50 MCG/ACT NA SUSP: 2.0000 | Freq: Every day | NASAL | 6 refills | Status: DC

## 2022-03-26 NOTE — Progress Notes (Signed)
E-Visit for Upper Respiratory Infection   We are sorry you are not feeling well.  Here is how we plan to help!  Based on what you have shared with me, it looks like you may have a viral upper respiratory infection.  Upper respiratory infections are caused by a large number of viruses; however, rhinovirus is the most common cause.   Symptoms vary from person to person, with common symptoms including sore throat, cough, fatigue or lack of energy and feeling of general discomfort.  A low-grade fever of up to 100.4 may present, but is often uncommon.  Symptoms vary however, and are closely related to a person's age or underlying illnesses.  The most common symptoms associated with an upper respiratory infection are nasal discharge or congestion, cough, sneezing, headache and pressure in the ears and face.  These symptoms usually persist for about 3 to 10 days, but can last up to 2 weeks.  It is important to know that upper respiratory infections do not cause serious illness or complications in most cases.    Upper respiratory infections can be transmitted from person to person, with the most common method of transmission being a person's hands.  The virus is able to live on the skin and can infect other persons for up to 2 hours after direct contact.  Also, these can be transmitted when someone coughs or sneezes; thus, it is important to cover the mouth to reduce this risk.  To keep the spread of the illness at bay, good hand hygiene is very important.  This is an infection that is most likely caused by a virus. There are no specific treatments other than to help you with the symptoms until the infection runs its course.  We are sorry you are not feeling well.  Here is how we plan to help!   For nasal congestion, you may use an oral decongestants such as Mucinex D or if you have glaucoma or high blood pressure use plain Mucinex.  Saline nasal spray or nasal drops can help and can safely be used as often as  needed for congestion.  For your congestion, I have prescribed Fluticasone nasal spray one spray in each nostril twice a day  If you do not have a history of heart disease, hypertension, diabetes or thyroid disease, prostate/bladder issues or glaucoma, you may also use Sudafed to treat nasal congestion.  It is highly recommended that you consult with a pharmacist or your primary care physician to ensure this medication is safe for you to take.     If you have a cough, you may use cough suppressants such as Delsym and Robitussin.  If you have glaucoma or high blood pressure, you can also use Coricidin HBP.   For cough I have prescribed for you A prescription cough medication called Tessalon Perles 100 mg. You may take 1-2 capsules every 8 hours as needed for cough  If you have a sore or scratchy throat, use a saltwater gargle-  to  teaspoon of salt dissolved in a 4-ounce to 8-ounce glass of warm water.  Gargle the solution for approximately 15-30 seconds and then spit.  It is important not to swallow the solution.  You can also use throat lozenges/cough drops and Chloraseptic spray to help with throat pain or discomfort.  Warm or cold liquids can also be helpful in relieving throat pain.  For headache, pain or general discomfort, you can use Ibuprofen or Tylenol as directed.   Some authorities believe   that zinc sprays or the use of Echinacea may shorten the course of your symptoms.   HOME CARE Only take medications as instructed by your medical team. Be sure to drink plenty of fluids. Water is fine as well as fruit juices, sodas and electrolyte beverages. You may want to stay away from caffeine or alcohol. If you are nauseated, try taking small sips of liquids. How do you know if you are getting enough fluid? Your urine should be a pale yellow or almost colorless. Get rest. Taking a steamy shower or using a humidifier may help nasal congestion and ease sore throat pain. You can place a towel over  your head and breathe in the steam from hot water coming from a faucet. Using a saline nasal spray works much the same way. Cough drops, hard candies and sore throat lozenges may ease your cough. Avoid close contacts especially the very young and the elderly Cover your mouth if you cough or sneeze Always remember to wash your hands.   GET HELP RIGHT AWAY IF: You develop worsening fever. If your symptoms do not improve within 10 days You develop yellow or green discharge from your nose over 3 days. You have coughing fits You develop a severe head ache or visual changes. You develop shortness of breath, difficulty breathing or start having chest pain Your symptoms persist after you have completed your treatment plan  MAKE SURE YOU  Understand these instructions. Will watch your condition. Will get help right away if you are not doing well or get worse.  Thank you for choosing an e-visit.  Your e-visit answers were reviewed by a board certified advanced clinical practitioner to complete your personal care plan. Depending upon the condition, your plan could have included both over the counter or prescription medications.  Please review your pharmacy choice. Make sure the pharmacy is open so you can pick up prescription now. If there is a problem, you may contact your provider through MyChart messaging and have the prescription routed to another pharmacy.  Your safety is important to us. If you have drug allergies check your prescription carefully.   For the next 24 hours you can use MyChart to ask questions about today's visit, request a non-urgent call back, or ask for a work or school excuse. You will get an email in the next two days asking about your experience. I hope that your e-visit has been valuable and will speed your recovery.    I have spent 5 minutes in review of e-visit questionnaire, review and updating patient chart, medical decision making and response to patient.    Asmar Brozek Z Ward, PA-C    

## 2022-05-21 ENCOUNTER — Other Ambulatory Visit: Payer: Self-pay | Admitting: Adult Health

## 2022-05-22 ENCOUNTER — Telehealth: Payer: Self-pay | Admitting: Adult Health

## 2022-05-22 NOTE — Telephone Encounter (Signed)
Left patient voicemail letting her know that Julie Cummings had sent in a refill and that she would need to get her pap done before she would send any more refills in.

## 2022-05-25 ENCOUNTER — Other Ambulatory Visit: Payer: Self-pay | Admitting: Family Medicine

## 2022-06-09 ENCOUNTER — Telehealth: Payer: BC Managed Care – PPO | Admitting: Nurse Practitioner

## 2022-06-09 DIAGNOSIS — J029 Acute pharyngitis, unspecified: Secondary | ICD-10-CM | POA: Diagnosis not present

## 2022-06-09 NOTE — Progress Notes (Signed)

## 2022-06-09 NOTE — Progress Notes (Signed)
I have spent 5 minutes in review of e-visit questionnaire, review and updating patient chart, medical decision making and response to patient.  ° °Jamauri Kruzel W Jiyah Torpey, NP ° °  °

## 2022-06-10 ENCOUNTER — Telehealth: Payer: BC Managed Care – PPO | Admitting: Nurse Practitioner

## 2022-06-10 DIAGNOSIS — J020 Streptococcal pharyngitis: Secondary | ICD-10-CM

## 2022-06-10 MED ORDER — PENICILLIN V POTASSIUM 500 MG PO TABS: 500.0000 mg | ORAL_TABLET | Freq: Two times a day (BID) | ORAL | 0 refills | Status: AC

## 2022-06-10 NOTE — Progress Notes (Signed)
I have spent 5 minutes in review of e-visit questionnaire, review and updating patient chart, medical decision making and response to patient.  ° °Ariele Vidrio W Arika Mainer, NP ° °  °

## 2022-06-10 NOTE — Progress Notes (Signed)

## 2022-06-13 ENCOUNTER — Telehealth: Payer: Self-pay | Admitting: Advanced Practice Midwife

## 2022-06-13 NOTE — Telephone Encounter (Signed)
Returned patient's call.  Informed there is one additional refill on the Lo LoEstrin that was sent in on 4/22

## 2022-06-13 NOTE — Telephone Encounter (Signed)
Called patient to reschedule her appointment on 5/23 due to scheduling conflict and the soonest I could get her in was 6/10. She will run out of birth control before then. Can we send her another refill to last her to her appointment? Please advise.

## 2022-06-22 ENCOUNTER — Ambulatory Visit: Payer: BC Managed Care – PPO | Admitting: Advanced Practice Midwife

## 2022-06-28 ENCOUNTER — Ambulatory Visit: Payer: BC Managed Care – PPO | Admitting: Family Medicine

## 2022-06-28 ENCOUNTER — Telehealth: Payer: BC Managed Care – PPO

## 2022-06-28 ENCOUNTER — Encounter: Payer: Self-pay | Admitting: Family Medicine

## 2022-06-28 VITALS — BP 114/78 | HR 78 | Temp 98.6°F | Ht 64.0 in | Wt 241.4 lb

## 2022-06-28 DIAGNOSIS — L509 Urticaria, unspecified: Secondary | ICD-10-CM | POA: Diagnosis not present

## 2022-06-28 DIAGNOSIS — T7840XA Allergy, unspecified, initial encounter: Secondary | ICD-10-CM | POA: Diagnosis not present

## 2022-06-28 MED ORDER — PREDNISONE 10 MG PO TABS: ORAL_TABLET | ORAL | 0 refills | Status: AC

## 2022-06-28 NOTE — Progress Notes (Signed)
Acute Office Visit   Subjective:  Patient ID: Julie Cummings, female    DOB: 09-05-1990, 32 y.o.   MRN: [id number]  Chief Complaint  Patient presents with   Urticaria    Pt reports she went to [place] buffet Sunday 06/25/2022 Pt reports she started getting hives that night and mouth felt "weird" with in 1 hr OTC -Benadryl and anti itch cream Hives are all over body now. Pt reports she removed a tick off of her Friday    Urticaria   Patient is a 32 year old caucasian female that presents with hives all over her body. She reports on Monday, 05/27, she ate at a [place] buffet and started having hives that night and mouth felt "weird" within 1 hr. Lips did swell, but no throat swelling. She reports the rash started on abd and lower back, now has radiated to all over body. Itching.   Patient hast tried Benadryl and anti-itch cream.   Also, removed a tick off of her Friday.    ROS See HPI above      Objective:   BP 114/78   Pulse 78   Temp 98.6 F (37 C)   Ht 5\' 4"  (1.626 m)   Wt 241 lb 6 oz (109.5 kg)   SpO2 99%   BMI 41.43 kg/m    Physical Exam Vitals reviewed.  Constitutional:      General: She is not in acute distress.    Appearance: Normal appearance. She is obese. She is not ill-appearing, toxic-appearing or diaphoretic.  HENT:     Head: Normocephalic and atraumatic.     Mouth/Throat:     Pharynx: Oropharynx is clear. Uvula midline. No pharyngeal swelling, oropharyngeal exudate, posterior oropharyngeal erythema or uvula swelling.     Comments: Upper and lower lips mildly swollen  Eyes:     General:        Right eye: No discharge.        Left eye: No discharge.     Conjunctiva/sclera: Conjunctivae normal.  Cardiovascular:     Rate and Rhythm: Normal rate and regular rhythm.     Heart sounds: Normal heart sounds. No murmur heard.    No friction rub. No gallop.  Pulmonary:     Effort: Pulmonary effort is normal. No respiratory distress.     Breath sounds:  Normal breath sounds.  Musculoskeletal:        General: Normal range of motion.  Skin:    General: Skin is warm and dry.     Findings: Rash (Raised red welts) present.  Neurological:     General: No focal deficit present.     Mental Status: She is alert and oriented to person, place, and time. Mental status is at baseline.  Psychiatric:        Mood and Affect: Mood normal.        Behavior: Behavior normal.        Thought Content: Thought content normal.        Judgment: Judgment normal.       Assessment & Plan:  Hives -     predniSONE; Take 6 tablets (60 mg total) by mouth daily with breakfast for 1 day, THEN 5 tablets (50 mg total) daily with breakfast for 1 day, THEN 4 tablets (40 mg total) daily with breakfast for 1 day, THEN 3 tablets (30 mg total) daily with breakfast for 1 day, THEN 2 tablets (20 mg total) daily with breakfast for 1 day, THEN 1  tablet (10 mg total) daily with breakfast for 1 day.  Dispense: 21 tablet; Refill: 0 -     Ambulatory referral to Allergy  Allergic reaction, initial encounter -     predniSONE; Take 6 tablets (60 mg total) by mouth daily with breakfast for 1 day, THEN 5 tablets (50 mg total) daily with breakfast for 1 day, THEN 4 tablets (40 mg total) daily with breakfast for 1 day, THEN 3 tablets (30 mg total) daily with breakfast for 1 day, THEN 2 tablets (20 mg total) daily with breakfast for 1 day, THEN 1 tablet (10 mg total) daily with breakfast for 1 day.  Dispense: 21 tablet; Refill: 0 -     Ambulatory referral to Allergy  -Prescribed Prednisone 10mg , 6 day taper for allergic reaction. Recommend to continue with anti-itch cream as needed and can take Benadryl. Recommend to not take any NSAIDS, such as Ibuprofen, Advil, Aleve, Naproxen, or Aspirin while taking Prednisone. -Placed a referral to allergy specialist for testing. Concerned it could be a seafood allergy or eggs. Recommend to not eat these foods until seeing specialist.  -If you have lip  swelling again or this becomes worse, recommend to go to the closes emergency department. Concerned about allergic reaction becoming worse next time if exposed the trigger.   [name], NP

## 2022-06-28 NOTE — Patient Instructions (Addendum)
-  Prescribed Prednisone 10mg , 6 day taper for allergic reaction. Recommend to continue with anti-itch cream as needed and can take Benadryl. Recommend to not take any NSAIDS, such as Ibuprofen, Advil, Aleve, Naproxen, or Aspirin while taking Prednisone. -Placed a referral to allergy specialist for testing. Concerned it could be a seafood allergy or eggs. Recommend to not eat these foods until seeing specialist.  -If you have lip swelling again or this becomes worse, recommend to go to the closes emergency department. Concerned about allergic reaction becoming worse next time if exposed the trigger.

## 2022-07-01 ENCOUNTER — Other Ambulatory Visit: Payer: Self-pay | Admitting: Family Medicine

## 2022-07-10 ENCOUNTER — Encounter: Payer: Self-pay | Admitting: Advanced Practice Midwife

## 2022-07-10 ENCOUNTER — Other Ambulatory Visit (HOSPITAL_COMMUNITY)
Admission: RE | Admit: 2022-07-10 | Discharge: 2022-07-10 | Disposition: A | Discharge status: Home / Self Care | Payer: BC Managed Care – PPO | Source: Ambulatory Visit | Attending: Advanced Practice Midwife | Admitting: Advanced Practice Midwife

## 2022-07-10 ENCOUNTER — Ambulatory Visit (INDEPENDENT_AMBULATORY_CARE_PROVIDER_SITE_OTHER): Payer: BC Managed Care – PPO | Admitting: Advanced Practice Midwife

## 2022-07-10 ENCOUNTER — Ambulatory Visit (INDEPENDENT_AMBULATORY_CARE_PROVIDER_SITE_OTHER): Payer: BC Managed Care – PPO | Admitting: Family Medicine

## 2022-07-10 ENCOUNTER — Encounter: Payer: Self-pay | Admitting: Family Medicine

## 2022-07-10 VITALS — BP 132/87 | HR 103 | Temp 97.8°F | Ht 65.0 in | Wt 245.2 lb

## 2022-07-10 VITALS — BP 126/78 | HR 85 | Ht 64.0 in | Wt 246.0 lb

## 2022-07-10 DIAGNOSIS — Z01419 Encounter for gynecological examination (general) (routine) without abnormal findings: Secondary | ICD-10-CM | POA: Diagnosis present

## 2022-07-10 DIAGNOSIS — Z0000 Encounter for general adult medical examination without abnormal findings: Secondary | ICD-10-CM

## 2022-07-10 DIAGNOSIS — Z13220 Encounter for screening for lipoid disorders: Secondary | ICD-10-CM | POA: Diagnosis not present

## 2022-07-10 DIAGNOSIS — Z131 Encounter for screening for diabetes mellitus: Secondary | ICD-10-CM

## 2022-07-10 DIAGNOSIS — F419 Anxiety disorder, unspecified: Secondary | ICD-10-CM | POA: Diagnosis not present

## 2022-07-10 DIAGNOSIS — Z6841 Body Mass Index (BMI) 40.0 and over, adult: Secondary | ICD-10-CM | POA: Diagnosis not present

## 2022-07-10 DIAGNOSIS — Z8249 Family history of ischemic heart disease and other diseases of the circulatory system: Secondary | ICD-10-CM

## 2022-07-10 LAB — LIPID PANEL
Cholesterol: 176 mg/dL (ref 0–200)
HDL: 72.6 mg/dL (ref >39.00)
LDL Cholesterol: 88 mg/dL (ref 0–99)
NonHDL: 103.59
Total CHOL/HDL Ratio: 2
Triglycerides: 76 mg/dL (ref 0.0–149.0)
VLDL: 15.2 mg/dL (ref 0.0–40.0)

## 2022-07-10 LAB — CBC WITH DIFFERENTIAL/PLATELET
Basophils Absolute: 0.1 10*3/uL (ref 0.0–0.1)
Basophils Relative: 0.9 % (ref 0.0–3.0)
Eosinophils Absolute: 0.8 10*3/uL — ABNORMAL HIGH (ref 0.0–0.7)
Eosinophils Relative: 7.8 % — ABNORMAL HIGH (ref 0.0–5.0)
HCT: 44 % (ref 36.0–46.0)
Hemoglobin: 14.8 g/dL (ref 12.0–15.0)
Lymphocytes Relative: 29.8 % (ref 12.0–46.0)
Lymphs Abs: 3 10*3/uL (ref 0.7–4.0)
MCHC: 33.5 g/dL (ref 30.0–36.0)
MCV: 89.5 fl (ref 78.0–100.0)
Monocytes Absolute: 0.7 10*3/uL (ref 0.1–1.0)
Monocytes Relative: 6.4 % (ref 3.0–12.0)
Neutro Abs: 5.6 10*3/uL (ref 1.4–7.7)
Neutrophils Relative %: 55.1 % (ref 43.0–77.0)
Platelets: 242 10*3/uL (ref 150.0–400.0)
RBC: 4.92 Mil/uL (ref 3.87–5.11)
RDW: 13 % (ref 11.5–15.5)
WBC: 10.1 10*3/uL (ref 4.0–10.5)

## 2022-07-10 LAB — COMPREHENSIVE METABOLIC PANEL
ALT: 17 U/L (ref 0–35)
AST: 15 U/L (ref 0–37)
Albumin: 3.9 g/dL (ref 3.5–5.2)
Alkaline Phosphatase: 100 U/L (ref 39–117)
BUN: 12 mg/dL (ref 6–23)
CO2: 24 mEq/L (ref 19–32)
Calcium: 9.1 mg/dL (ref 8.4–10.5)
Chloride: 104 mEq/L (ref 96–112)
Creatinine, Ser: 0.89 mg/dL (ref 0.40–1.20)
GFR: 86.16 mL/min (ref >60.00)
Glucose, Bld: 94 mg/dL (ref 70–99)
Potassium: 4.2 mEq/L (ref 3.5–5.1)
Sodium: 138 mEq/L (ref 135–145)
Total Bilirubin: 0.8 mg/dL (ref 0.2–1.2)
Total Protein: 6.4 g/dL (ref 6.0–8.3)

## 2022-07-10 LAB — HEMOGLOBIN A1C: Hgb A1c MFr Bld: 5.2 % (ref 4.6–6.5)

## 2022-07-10 LAB — TSH: TSH: 1.3 u[IU]/mL (ref 0.35–5.50)

## 2022-07-10 MED ORDER — BUSPIRONE HCL 10 MG PO TABS
10.0000 mg | ORAL_TABLET | Freq: Two times a day (BID) | ORAL | 1 refills | Status: DC
Start: 2022-07-10 — End: 2023-01-01

## 2022-07-10 MED ORDER — LO LOESTRIN FE 1 MG-10 MCG / 10 MCG PO TABS: ORAL_TABLET | ORAL | 11 refills | Status: DC

## 2022-07-10 MED ORDER — ESCITALOPRAM OXALATE 20 MG PO TABS: 20.0000 mg | ORAL_TABLET | Freq: Every day | ORAL | 1 refills | Status: DC

## 2022-07-10 NOTE — Patient Instructions (Addendum)
Return in about 24 weeks (around 12/25/2022) for Routine chronic condition follow-up.  Start buspar 1/2 tab twice a day for 1 week, then increase 1 tab twice a day.       Great to see you today.  I have refilled the medication(s) we provide.   If labs were collected, we will inform you of lab results once received either by echart message or telephone call.   - echart message- for normal results that have been seen by the patient already.   - telephone call: abnormal results or if patient has not viewed results in their echart.

## 2022-07-10 NOTE — Progress Notes (Signed)
Patient ID: Julie Cummings, female  DOB: 11-11-1990, 32 y.o.   MRN: [id number] Patient Care Team    Relationship Specialty Notifications Start End  Natalia Leatherwood, DO PCP - General Family Medicine  02/04/20   Jacklyn Shell, CNM Midwife Certified Nurse Midwife  02/04/20    Comment: [facility]    Chief Complaint  Patient presents with   Annual Exam    Pt is fasting.     Subjective:  Julie Cummings is a 32 y.o.  Female  present for CPE and routine chronic condition, combination appointment All past medical history, surgical history, allergies, family history, immunizations, medications and social history were updated in the electronic medical record today. All recent labs, ED visits and hospitalizations within the last year were reviewed.  Health maintenance:  Colon cancer screen: no fhx. Screen at 45 Mammogram: no fhx. Screen at 40 Cervical cancer screening: last pap: 07/2018, completed by: [facility]- has appt today Immunizations: tdap UTD 2017, Influenza (encouraged yearly) Infectious disease screening: HIV completed, Hep C completed DEXA:routine screen Assistive device: none Oxygen WUJ:WJXB Patient has a Dental home. Hospitalizations/ED visits: reviewed  anxiety/palpitations: Patient is compliant with Lexapro 20 mg qd, and she feels this has been helpful with her anxiety.        07/10/2022    8:51 AM 06/28/2022    9:40 AM 11/28/2021    8:53 AM 07/08/2021    9:00 AM 04/22/2021    9:10 AM  Depression screen PHQ 2/9  Decreased Interest 0 0 1 0 0  Down, Depressed, Hopeless 0 0 0 0 0  PHQ - 2 Score 0 0 1 0 0  Altered sleeping 1 0 1 0   Tired, decreased energy 2 1 2 1    Change in appetite 2 2 0    Feeling bad or failure about yourself  0 0 0 0   Trouble concentrating 0 0 0 0   Moving slowly or fidgety/restless 0 0 0 0   Suicidal thoughts 0 0 0 0   PHQ-9 Score 5 3 4 1    Difficult doing work/chores Not difficult at all Not difficult at all          07/10/2022    8:51 AM 06/28/2022    9:40 AM 11/28/2021    8:54 AM 07/08/2021    9:07 AM  GAD 7 : Generalized Anxiety Score  Nervous, Anxious, on Edge 3 2 2 1   Control/stop worrying 2 1 1  0  Worry too much - different things 2 2 1 1   Trouble relaxing 1 0 1 0  Restless 0 0 0 0  Easily annoyed or irritable 1 2 2 1   Afraid - awful might happen 2 2 0 0  Total GAD 7 Score 11 9 7 3   Anxiety Difficulty Somewhat difficult Somewhat difficult     Immunization History  Administered Date(s) Administered   Influenza,inj,Quad PF,6+ Mos 11/05/2012   MMR 12/02/2010   PPD Test 09/14/2019   Rho (D) Immune Globulin 12/02/2010, 09/24/2012, 11/19/2012   Tdap 12/02/2010, 11/20/2012, 05/20/2015    Past Medical History:  Diagnosis Date   Allergy    Chronic headaches    Depression    Gall stones    GERD (gastroesophageal reflux disease)    History of UTI    Palpitations 02/04/2020   Preeclampsia    Rh negative state in antepartum period 10/07/2014   B-   Rhogam after 28wks: 04/27/2015     Allergies  Allergen  Reactions   Morphine And Codeine Other (See Comments) and Palpitations    Racing heart; when patient had gallbladder taken out in December 2012, MD told patient that her heart was racing and not to take Morphine again. Other reaction(s): Other (See Comments), Unknown Racing heart; when patient had gallbladder taken out in December 2012, MD told patient that her heart was racing and not to take Morphine again.   Morphine Palpitations   Past Surgical History:  Procedure Laterality Date   CHOLECYSTECTOMY  12/2010   ERCP     TONSILLECTOMY  1977   Family History  Problem Relation Age of Onset   Hypertension Sister    Arthritis Sister    Depression Sister    Miscarriages / Stillbirths Sister    CAD Maternal Grandfather    Diabetes Maternal Grandfather    Stroke Maternal Grandfather    Heart attack Maternal Grandfather    Heart disease Maternal Grandfather    Hearing loss Maternal  Grandfather    Hyperlipidemia Maternal Grandfather    Hypertension Maternal Grandfather    Diabetes Paternal Grandfather    Heart disease Paternal Grandfather    Stroke Paternal Grandfather    Heart attack Paternal Grandfather    Diabetes Mother    Arthritis Mother    Hearing loss Mother    Hypertension Mother    Diabetes Father    Thyroid disease Maternal Grandmother    Heart disease Paternal Grandmother    Miscarriages / Stillbirths Sister    Hypertension Other        grandparents   Asthma Son    Asthma Son    Social History   Social History Narrative   Marital status/children/pets: Married, 3 children   Education/employment: Automotive engineer, TEFL teacher:      - wears seatbelt: Yes     - Feels safe in their relationships: Yes    Allergies as of 07/10/2022       Reactions   Morphine And Codeine Other (See Comments), Palpitations   Racing heart; when patient had gallbladder taken out in December 2012, MD told patient that her heart was racing and not to take Morphine again. Other reaction(s): Other (See Comments), Unknown Racing heart; when patient had gallbladder taken out in December 2012, MD told patient that her heart was racing and not to take Morphine again.   Morphine Palpitations        Medication List        Accurate as of July 10, 2022  9:11 AM. If you have any questions, ask your nurse or doctor.          STOP taking these medications    benzonatate 100 MG capsule Commonly known as: TESSALON Stopped by: Felix Pacini, DO   fluticasone 50 MCG/ACT nasal spray Commonly known as: FLONASE Stopped by: Felix Pacini, DO       TAKE these medications    albuterol 108 (90 Base) MCG/ACT inhaler Commonly known as: VENTOLIN HFA INHALE 1-2 PUFFS INTO THE LUNGS EVERY 6 HOURS AS NEEDED.   busPIRone 10 MG tablet Commonly known as: BUSPAR Take 1 tablet (10 mg total) by mouth 2 (two) times daily. Started by: Felix Pacini, DO   escitalopram 20  MG tablet Commonly known as: LEXAPRO Take 1 tablet (20 mg total) by mouth daily.   ipratropium 0.03 % nasal spray Commonly known as: ATROVENT Place 2 sprays into both nostrils every 12 (twelve) hours.   Lo Loestrin Fe 1 MG-10 MCG / 10 MCG  tablet Generic drug: Norethindrone-Ethinyl Estradiol-Fe Biphas TAKE 1 TABLET BY MOUTH EVERY DAY. PATIENT NEEDS TO MAKE AN APPOINTMENT BEFORE NEXT REFILL        All past medical history, surgical history, allergies, family history, immunizations andmedications were updated in the EMR today and reviewed under the history and medication portions of their EMR.      No results found.   ROS 14 pt review of systems performed and negative (unless mentioned in an HPI)  Objective: BP 132/87   Pulse (!) 103   Temp 97.8 F (36.6 C)   Ht 5\' 5"  (1.651 m)   Wt 245 lb 3.2 oz (111.2 kg)   LMP  (LMP Unknown)   SpO2 98%   BMI 40.80 kg/m  Physical Exam Vitals and nursing note reviewed.  Constitutional:      General: She is not in acute distress.    Appearance: Normal appearance. She is obese. She is not ill-appearing or toxic-appearing.  HENT:     Head: Normocephalic and atraumatic.     Right Ear: Tympanic membrane, ear canal and external ear normal. There is no impacted cerumen.     Left Ear: Tympanic membrane, ear canal and external ear normal. There is no impacted cerumen.     Nose: No congestion or rhinorrhea.     Mouth/Throat:     Mouth: Mucous membranes are moist.     Pharynx: Oropharynx is clear. No oropharyngeal exudate or posterior oropharyngeal erythema.  Eyes:     General: No scleral icterus.       Right eye: No discharge.        Left eye: No discharge.     Extraocular Movements: Extraocular movements intact.     Conjunctiva/sclera: Conjunctivae normal.     Pupils: Pupils are equal, round, and reactive to light.  Cardiovascular:     Rate and Rhythm: Normal rate and regular rhythm.     Pulses: Normal pulses.     Heart sounds: Normal  heart sounds. No murmur heard.    No friction rub. No gallop.  Pulmonary:     Effort: Pulmonary effort is normal. No respiratory distress.     Breath sounds: Normal breath sounds. No stridor. No wheezing, rhonchi or rales.  Chest:     Chest wall: No tenderness.  Abdominal:     General: Abdomen is flat. Bowel sounds are normal. There is no distension.     Palpations: Abdomen is soft. There is no mass.     Tenderness: There is no abdominal tenderness. There is no right CVA tenderness, left CVA tenderness, guarding or rebound.     Hernia: No hernia is present.  Musculoskeletal:        General: No swelling, tenderness or deformity. Normal range of motion.     Cervical back: Normal range of motion and neck supple. No rigidity or tenderness.     Right lower leg: No edema.     Left lower leg: No edema.  Lymphadenopathy:     Cervical: No cervical adenopathy.  Skin:    General: Skin is warm and dry.     Coloration: Skin is not jaundiced or pale.     Findings: No bruising, erythema, lesion or rash.  Neurological:     General: No focal deficit present.     Mental Status: She is alert and oriented to person, place, and time. Mental status is at baseline.     Cranial Nerves: No cranial nerve deficit.     Sensory: No sensory  deficit.     Motor: No weakness.     Coordination: Coordination normal.     Gait: Gait normal.     Deep Tendon Reflexes: Reflexes normal.  Psychiatric:        Mood and Affect: Mood normal.        Behavior: Behavior normal.        Thought Content: Thought content normal.        Judgment: Judgment normal.     No results found.  Assessment/plan: Julie Cummings is a 32 y.o. female present for CPE and chronic condition follow-up Anxiety Still having anxiety. Continue Lexapro 20  mg qd Start buspar taper to 10 mg bid.  Tried in past: Zoloft, wellbutrin   Morbid obesity (HCC)/FHX heart disease/stroke Lipid screen collected today A1c collected today  Routine  general medical examination at a health care facility Patient was encouraged to exercise greater than 150 minutes a week. Patient was encouraged to choose a diet filled with fresh fruits and vegetables, and lean meats. AVS provided to patient today for education/recommendation on gender specific health and safety maintenance. Colonoscopy: no fhx. Screen at 45 Mammogram: no fhx. Screen at 40 Cervical cancer screening: last pap: 07/2018, [facility]: [facility] Immunizations: tdap UTD 2017, Influenza (encouraged yearly) Infectious disease screening: HIV completed, Hep C completed DEXA:routine screen    Orders Placed This Encounter  Procedures   CBC with Differential/Platelet   Comprehensive metabolic panel   Hemoglobin A1c   TSH   Lipid panel   Meds ordered this encounter  Medications   escitalopram (LEXAPRO) 20 MG tablet    Sig: Take 1 tablet (20 mg total) by mouth daily.    Dispense:  90 tablet    Refill:  1   busPIRone (BUSPAR) 10 MG tablet    Sig: Take 1 tablet (10 mg total) by mouth 2 (two) times daily.    Dispense:  180 tablet    Refill:  1   Referral Orders  No referral(s) requested today     Electronically signed by: Felix Pacini, DO [facility] [place]

## 2022-07-10 NOTE — Progress Notes (Signed)
Julie Cummings 32 y.o.  Vitals:   07/10/22 1132  BP: 126/78  Pulse: 85     Filed Weights   07/10/22 1132  Weight: 246 lb (111.6 kg)    Past Medical History: Past Medical History:  Diagnosis Date   Allergy    Chronic headaches    Depression    Gall stones    GERD (gastroesophageal reflux disease)    History of UTI    Palpitations 02/04/2020   Preeclampsia    Rh negative state in antepartum period 10/07/2014   B-   Rhogam after 28wks: 04/27/2015      Past Surgical History: Past Surgical History:  Procedure Laterality Date   CHOLECYSTECTOMY  12/2010   ERCP     TONSILLECTOMY  1977    Family History: Family History  Problem Relation Age of Onset   Diabetes Paternal Grandfather    Heart disease Paternal Grandfather    Stroke Paternal Grandfather    Heart attack Paternal Grandfather    Heart disease Paternal Grandmother    Thyroid disease Maternal Grandmother    CAD Maternal Grandfather    Diabetes Maternal Grandfather    Stroke Maternal Grandfather    Heart attack Maternal Grandfather    Heart disease Maternal Grandfather    Hearing loss Maternal Grandfather    Hyperlipidemia Maternal Grandfather    Hypertension Maternal Grandfather    Diabetes Father    Diabetes Mother    Arthritis Mother    Hearing loss Mother    Hypertension Mother    Hypertension Sister    Arthritis Sister    Depression Sister    Miscarriages / [place] Sister    Miscarriages / Stillbirths Sister    Asthma Son    Asthma Son    Hypertension Other        grandparents    Social History: Social History   Tobacco Use   Smoking status: Former    Years: .5    Types: Cigarettes   Smokeless tobacco: Never   Tobacco comments:    quit in 2011  Vaping Use   Vaping Use: Never used  Substance Use Topics   Alcohol use: Yes    Comment: rare   Drug use: No    Allergies:  Allergies  Allergen Reactions   Morphine And Codeine Other (See Comments) and Palpitations    Racing heart;  when patient had gallbladder taken out in December 2012, MD told patient that her heart was racing and not to take Morphine again. Other reaction(s): Other (See Comments), Unknown Racing heart; when patient had gallbladder taken out in December 2012, MD told patient that her heart was racing and not to take Morphine again.   Morphine Palpitations      Current Outpatient Medications:    albuterol (VENTOLIN HFA) 108 (90 Base) MCG/ACT inhaler, INHALE 1-2 PUFFS INTO THE LUNGS EVERY 6 HOURS AS NEEDED., Disp: 18 each, Rfl: 5   escitalopram (LEXAPRO) 20 MG tablet, Take 1 tablet (20 mg total) by mouth daily., Disp: 90 tablet, Rfl: 1   ipratropium (ATROVENT) 0.03 % nasal spray, Place 2 sprays into both nostrils every 12 (twelve) hours., Disp: 30 mL, Rfl: 0   LO LOESTRIN FE 1 MG-10 MCG / 10 MCG tablet, TAKE 1 TABLET BY MOUTH EVERY DAY. PATIENT NEEDS TO MAKE AN APPOINTMENT BEFORE NEXT REFILL, Disp: 28 tablet, Rfl: 1   busPIRone (BUSPAR) 10 MG tablet, Take 1 tablet (10 mg total) by mouth 2 (two) times daily. (Patient not taking: Reported  on 07/10/2022), Disp: 180 tablet, Rfl: 1  History of Present Illness: Here for pap. Saw PCP this am for chronic issues.  Small rectocele, not really problematic.   Had taken ozempic w/40# loss, gained 20# back after stopping d/t GI issues.  Plans on adjusting diet and continuing to exercise.      Component Value Date/Time   DIAGPAP  08/14/2018 0000    NEGATIVE FOR INTRAEPITHELIAL LESIONS OR MALIGNANCY.   ADEQPAP  08/14/2018 0000    Satisfactory for evaluation  endocervical/transformation zone component PRESENT.     Review of Systems   Patient denies any headaches, blurred vision, shortness of breath, chest pain, abdominal pain, problems with bowel movements, urination, or intercourse.   Physical Exam: General:  Well developed, well nourished, no acute distress Skin:  Warm and dry Neck:  Midline trachea, normal thyroid Lungs; Clear to auscultation  bilaterally Breast:  No dominant palpable mass, retraction, or nipple discharge Cardiovascular: Regular rate and rhythm Abdomen:  Soft, non tender, no hepatosplenomegaly Pelvic:  External genitalia is normal in appearance.  The vagina is normal in appearance.  The cervix is bulbous.  Uterus is felt to be normal size, shape, and contour.  No adnexal masses or tenderness noted.  Extremities:  No swelling or varicosities noted Psych:  No mood changes.     Impression: Normal GYN exam     Plan: Continue COCs, repeat pap in 3 years unless indicated  Meds ordered this encounter  Medications   Norethindrone-Ethinyl Estradiol-Fe Biphas (LO LOESTRIN FE) 1 MG-10 MCG / 10 MCG tablet    Sig: 1 po daily    Dispense:  28 tablet    Refill:  11    Order Specific Question:   Supervising Provider    Answer:   Duane Lope H [[id number]]

## 2022-07-11 LAB — CYTOLOGY - PAP
Comment: NEGATIVE
Diagnosis: NEGATIVE
High risk HPV: NEGATIVE

## 2022-07-22 ENCOUNTER — Telehealth: Payer: BC Managed Care – PPO | Admitting: Nurse Practitioner

## 2022-07-22 DIAGNOSIS — J0100 Acute maxillary sinusitis, unspecified: Secondary | ICD-10-CM

## 2022-07-22 MED ORDER — AMOXICILLIN-POT CLAVULANATE 875-125 MG PO TABS: 1.0000 | ORAL_TABLET | Freq: Two times a day (BID) | ORAL | 0 refills | Status: DC

## 2022-07-22 NOTE — Progress Notes (Signed)

## 2022-07-25 ENCOUNTER — Other Ambulatory Visit: Payer: Self-pay | Admitting: *Deleted

## 2022-07-25 ENCOUNTER — Ambulatory Visit: Payer: BC Managed Care – PPO | Admitting: Allergy

## 2022-07-25 ENCOUNTER — Other Ambulatory Visit: Payer: Self-pay | Admitting: Allergy

## 2022-07-25 ENCOUNTER — Other Ambulatory Visit: Payer: Self-pay

## 2022-07-25 ENCOUNTER — Encounter: Payer: Self-pay | Admitting: Allergy

## 2022-07-25 VITALS — BP 132/88 | HR 86 | Temp 98.8°F | Resp 16 | Ht 65.0 in | Wt 248.8 lb

## 2022-07-25 DIAGNOSIS — T7840XA Allergy, unspecified, initial encounter: Secondary | ICD-10-CM | POA: Insufficient documentation

## 2022-07-25 DIAGNOSIS — T781XXA Other adverse food reactions, not elsewhere classified, initial encounter: Secondary | ICD-10-CM

## 2022-07-25 DIAGNOSIS — J3089 Other allergic rhinitis: Secondary | ICD-10-CM | POA: Insufficient documentation

## 2022-07-25 DIAGNOSIS — J45990 Exercise induced bronchospasm: Secondary | ICD-10-CM

## 2022-07-25 DIAGNOSIS — T7840XD Allergy, unspecified, subsequent encounter: Secondary | ICD-10-CM

## 2022-07-25 DIAGNOSIS — T781XXD Other adverse food reactions, not elsewhere classified, subsequent encounter: Secondary | ICD-10-CM

## 2022-07-25 MED ORDER — AUVI-Q 0.3 MG/0.3ML IJ SOAJ: 0.3000 mg | INTRAMUSCULAR | 1 refills | Status: DC | PRN

## 2022-07-25 MED ORDER — EPINEPHRINE 0.3 MG/0.3ML IJ SOAJ: 0.3000 mg | INTRAMUSCULAR | 1 refills | Status: DC | PRN

## 2022-07-25 NOTE — Assessment & Plan Note (Signed)
Mainly flares with exertion and URIs. Today's spirometry was normal but just used albuterol a few hours ago. May use albuterol rescue inhaler 2 puffs or nebulizer every 4 to 6 hours as needed for shortness of breath, chest tightness, coughing, and wheezing. May use albuterol rescue inhaler 2 puffs 5 to 15 minutes prior to strenuous physical activities. Monitor frequency of use - if you need to use it more than twice per week on a consistent basis other than exercise related let us know.

## 2022-07-25 NOTE — Telephone Encounter (Signed)
Called and spoke with the patient, it seems that Julie Cummings is preferred with her insurance. I did send this in to [facility] pharmacy and advised her of setting up delivery. Patient verbalized understanding and will call us back if she needs anything.

## 2022-07-25 NOTE — Progress Notes (Signed)
New Patient Note  RE: Julie Cummings MRN: [id number] DOB: 03/19/1990 Date of Office Visit: 07/25/2022  Consult requested by: Alveria Apley, NP Primary care provider: Natalia Leatherwood, DO  Chief Complaint: Allergic Reaction ([employer] buffet- ate sushi w black caviar, crab rangoon, mushroom. When she left the restaurant she felt her gums felt weird, lip swelling started a few hours later after consuming. hives all over the body the next day and took steroids and bendaryl  but hives lasted for about a week ) and Asthma (Exercised induced asthma? Some wheezing )  History of Present Illness: I had the pleasure of seeing Julie Cummings for initial evaluation at the [facility] on 07/25/2022. She is a 32 y.o. female, who is referred here by Julie Abts, NP (primary care) for the evaluation of hives. She is accompanied today by her mother who provided/contributed to the history.   Patient went to a [place] buffet on 5/27 and she had a wide variety of foods that she usually eats. She had sushi with roe, steak, mushroom, crawfish, crab rangoon. She had caviar which she normally doesn't eat.   When she left, her gums felt tingly but she thought it was just her anxiety. That evening she noted some hives on her torso. The following day the hives worsened and had lip swelling. She took benadryl x 2 days with no benefit. She then went to her PCP and was given oral prednisone which helped.   She typically eats the above foods with no issues.  Since then she has been avoiding seafood, mushroom. She had beef since then with no issues. She had a tick bite 2 days prior. Not had anything with soy or sesame either. Nobody else had this type of reaction that ate with her that day.   Denies any fevers, chills, changes in medications, personal care products or recent infections. She has tried the following therapies: benadryl with some benefit. Systemic steroids: yes. Currently on  Claritin daily for seasonal allergies.  Previous work up includes: none. Previous history of rash/hives: no.  Dietary History: patient has been eating other foods including milk, eggs, peanut, treenuts, sesame, soy, wheat, meats, fruits and vegetables.  Not sure if she had soy or sesame seed since then. No Epipen prescribed.   Yesterday had a cheeseburger without any issues.   06/28/2022 PCP visit: "Patient is a 32 year old caucasian female that presents with hives all over her body. She reports on Monday, 05/27, she ate at a [place] buffet and started having hives that night and mouth felt "weird" within 1 hr. Lips did swell, but no throat swelling. She reports the rash started on abd and lower back, now has radiated to all over body. Itching.    Patient hast tried Benadryl and anti-itch cream.    Also, removed a tick off of her Friday."  Assessment and Plan: Aniyiah is a 32 y.o. female with: Allergic reaction Reaction after a wide variety of foods at a [place] buffet - mainly hives. Symptoms eventually resolved with prednisone. No other reactions. Denies changes in meds, personal care products or recent infections. Julie Cummings is a new thing she ate but she eats seafood with no issues. Since above incident avoiding seafood, mushroom and no soy and sesame ingestion. Had cheeseburger this week with no issues Today's skin testing showed: Borderline positive to trout, tuna, salmon and oyster. Negative to soy, sesame and mushroom.  Start strict avoidance of seafood for now.  Avoid mushroom, soy and sesame. Get bloodwork and if negative will recommend at home reintroduction of mushroom, soy and sesame only. For the seafood will need in office food challenge if meets criteria.  I have prescribed epinephrine injectable device and demonstrated proper use. For mild symptoms you can take over the counter antihistamines such as Benadryl and monitor symptoms closely. If symptoms worsen or if you have severe  symptoms including breathing issues, throat closure, significant swelling, whole body hives, severe diarrhea and vomiting, lightheadedness then inject epinephrine and seek immediate medical care afterwards. Emergency action plan given.  Exercise-induced asthma Mainly flares with exertion and URIs. Today's spirometry was normal but just used albuterol a few hours ago. May use albuterol rescue inhaler 2 puffs or nebulizer every 4 to 6 hours as needed for shortness of breath, chest tightness, coughing, and wheezing. May use albuterol rescue inhaler 2 puffs 5 to 15 minutes prior to strenuous physical activities. Monitor frequency of use - if you need to use it more than twice per week on a consistent basis other than exercise related let us know.   Other allergic rhinitis Some mild symptoms in the spring and fall. Takes Claritin prn with good benefit. No prior testing. Offered environmental panel testing today - declined.  Use over the counter antihistamines such as Zyrtec (cetirizine), Claritin (loratadine), Allegra (fexofenadine), or Xyzal (levocetirizine) daily as needed. May take twice a day during allergy flares. May switch antihistamines every few months. If worsening symptoms then recommend environmental allergy testing.  Return in about 1 year (around 07/25/2023).  Meds ordered this encounter  Medications   DISCONTD: EPINEPHrine 0.3 mg/0.3 mL IJ SOAJ injection    Sig: Inject 0.3 mg into the muscle as needed for anaphylaxis.    Dispense:  2 each    Refill:  1    May dispense generic/Mylan/Teva brand.   Lab Orders         Allergen Profile, Food-Fish         Allergen Profile, Shellfish         Tryptase         Allergen, Mushroom, Rf212         Soybean IgE         Allergen Sesame f10      Other allergy screening: Asthma: yes Diagnosed with EIB and sometimes has wheezing with URI. Takes albuterol 2 puffs with good benefit prior to exertion.  Rhino conjunctivitis: yes Some  sneezing, rhinorrhea, watery/itchy eyes. Usually flares in the spring and fall. No prior testing. Takes Claritin prn with good benefit.  Medication allergy: yes Hymenoptera allergy: no Urticaria: no Eczema:no History of recurrent infections suggestive of immunodeficency: no  Diagnostics: Spirometry:  Tracings reviewed. Her effort: Good reproducible efforts. FVC: 3.58L FEV1: 2.76L, 85% predicted FEV1/FVC ratio: 77% Interpretation: Spirometry consistent with normal pattern.  Please see scanned spirometry results for details.  Skin Testing: Environmental allergy panel and select foods. Borderline positive to trout, tuna, salmon and oyster. Negative to soy, sesame and mushroom.  Results discussed with patient/family.  Food Adult Perc - 07/25/22 1400     Time Antigen Placed 1445    Allergen Manufacturer Waynette Buttery    Location Back    Number of allergen test 17     Control-buffer 50% Glycerol Negative    Control-Histamine 2+    2. Soybean Negative    4. Sesame Negative    8. Shellfish Mix Negative    9. Fish Mix Negative    18. Trout --  3x3   19. Tuna --   3x3   20. Salmon --   3x3   21. Flounder Negative    22. Codfish Negative    23. Shrimp Negative    24. Crab Negative    25. Lobster Negative    26. Oyster --   +/-   27. Scallops Negative    46. 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 01/13/2021   Morbid obesity (HCC) 03/18/2020   Long term current use of hormonal contraceptive 03/18/2020   Weight loss counseling, encounter for 03/18/2020   Family history of heart disease 03/18/2020   Exercise-induced asthma 02/04/2020   Anxiety 02/04/2020   Past Medical History:  Diagnosis Date   Allergy    Chronic headaches    Depression     Eczema    Gall stones    GERD (gastroesophageal reflux disease)    History of UTI    Palpitations 02/04/2020   Preeclampsia    Rh negative state in antepartum period 10/07/2014   B-   Rhogam after 28wks: 04/27/2015     Past Surgical History: Past Surgical History:  Procedure Laterality Date   ADENOIDECTOMY     CHOLECYSTECTOMY  12/2010   ERCP     TONSILLECTOMY  1977   Medication List:  Current Outpatient Medications  Medication Sig Dispense Refill   albuterol (VENTOLIN HFA) 108 (90 Base) MCG/ACT inhaler INHALE 1-2 PUFFS INTO THE LUNGS EVERY 6 HOURS AS NEEDED. 18 each 5   amoxicillin-clavulanate (AUGMENTIN) 875-125 MG tablet Take 1 tablet by mouth 2 (two) times daily. 14 tablet 0   busPIRone (BUSPAR) 10 MG tablet Take 1 tablet (10 mg total) by mouth 2 (two) times daily. 180 tablet 1   escitalopram (LEXAPRO) 20 MG tablet Take 1 tablet (20 mg total) by mouth daily. 90 tablet 1   ipratropium (ATROVENT) 0.03 % nasal spray Place 2 sprays into both nostrils every 12 (twelve) hours. 30 mL 0   Norethindrone-Ethinyl Estradiol-Fe Biphas (LO LOESTRIN FE) 1 MG-10 MCG / 10 MCG tablet 1 po daily 28 tablet 11   AUVI-Q 0.3 MG/0.3ML SOAJ injection Inject 0.3 mg into the muscle as needed for anaphylaxis. 1 each 1   EPINEPHRINE 0.3 mg/0.3 mL IJ SOAJ injection INJECT 0.3 MG INTO THE MUSCLE AS NEEDED FOR ANAPHYLAXIS. 2 each 1   No current facility-administered medications for this visit.   Allergies: Allergies  Allergen Reactions   Morphine And Codeine Other (See Comments) and Palpitations    Racing heart; when patient had gallbladder taken out in December 2012, MD told patient that her heart was racing and not to take Morphine again. Other reaction(s): Other (See Comments), Unknown Racing heart; when patient had gallbladder taken out in December 2012, MD told patient that her heart was racing and not to take Morphine again.   Morphine Palpitations   Social History: Social History    Socioeconomic History   Marital status: Married    Spouse name: Not on file   Number of children: Not on file   Years of education: Not on file   Highest education level: Not on file  Occupational History   Not on file  Tobacco Use   Smoking status: Former  Years: .5    Types: Cigarettes   Smokeless tobacco: Never   Tobacco comments:    quit in 2011  Vaping Use   Vaping Use: Never used  Substance and Sexual Activity   Alcohol use: Yes    Comment: rare   Drug use: No   Sexual activity: Yes    Partners: Male    Birth control/protection: Pill  Other Topics Concern   Not on file  Social History Narrative   Marital status/children/pets: Married, 3 children   Education/employment: Automotive engineer, TEFL teacher:      - wears seatbelt: Yes     - Feels safe in their relationships: Yes   Social Determinants of Health   Financial Resource Strain: Low Risk  (07/10/2022)   Overall Financial Resource Strain (CARDIA)    Difficulty of Paying Living Expenses: Not hard at all  Food Insecurity: No Food Insecurity (07/10/2022)   Hunger Vital Sign    Worried About Running Out of Food in the Last Year: Never true    Ran Out of Food in the Last Year: Never true  Transportation Needs: No Transportation Needs (07/10/2022)   PRAPARE - Administrator, Civil Service (Medical): No    Lack of Transportation (Non-Medical): No  Physical Activity: Sufficiently Active (07/10/2022)   Exercise Vital Sign    Days of Exercise per Week: 4 days    Minutes of Exercise per Session: 50 min  Stress: Stress Concern Present (07/10/2022)   [employer] of Occupational Health - Occupational Stress Questionnaire    Feeling of Stress : To some extent  Social Connections: Socially Integrated (07/10/2022)   Social Connection and Isolation Panel [NHANES]    Frequency of Communication with Friends and Family: More than three times a week    Frequency of Social Gatherings with Friends and  Family: More than three times a week    Attends Religious Services: More than 4 times per year    Active Member of [employer] or Organizations: Yes    Attends Banker Meetings: 1 to 4 times per year    Marital Status: Married   Lives in a 25+ year old house. Smoking: denies Occupation: Pension scheme manager HistorySurveyor, minerals in the house: no Engineer, civil (consulting) in the family room: no Carpet in the bedroom: no Heating: gas Cooling: central Pet: yes 2 dogs x 58yrs and bearded dragon  Family History: Family History  Problem Relation Age of Onset   Diabetes Mother    Arthritis Mother    Hearing loss Mother    Hypertension Mother    Diabetes Father    Hypertension Sister    Arthritis Sister    Depression Sister    Miscarriages / [place] Sister    Miscarriages / [place] Sister    Thyroid disease Maternal Grandmother    CAD Maternal Grandfather    Diabetes Maternal Grandfather    Stroke Maternal Grandfather    Heart attack Maternal Grandfather    Heart disease Maternal Grandfather    Hearing loss Maternal Grandfather    Hyperlipidemia Maternal Grandfather    Hypertension Maternal Grandfather    Heart disease Paternal Grandmother    Diabetes Paternal Grandfather    Heart disease Paternal Grandfather    Stroke Paternal Grandfather    Heart attack Paternal Grandfather    Allergic rhinitis Son    Asthma Son    Asthma Son    Hypertension Other        grandparents  Review of Systems  Constitutional:  Negative for appetite change, chills, fever and unexpected weight change.  HENT:  Positive for congestion. Negative for rhinorrhea.   Eyes:  Negative for itching.  Respiratory:  Negative for cough, chest tightness, shortness of breath and wheezing.   Cardiovascular:  Negative for chest pain.  Gastrointestinal:  Negative for abdominal pain.  Genitourinary:  Negative for difficulty urinating.  Skin:  Negative for rash.  Neurological:  Negative for  headaches.    Objective: BP 132/88   Pulse 86   Temp 98.8 F (37.1 C)   Resp 16   Ht 5\' 5"  (1.651 m)   Wt 248 lb 12 oz (112.8 kg)   LMP  (LMP Unknown)   SpO2 95%   BMI 41.39 kg/m  Body mass index is 41.39 kg/m. Physical Exam Vitals and nursing note reviewed.  Constitutional:      Appearance: Normal appearance. She is well-developed.  HENT:     Head: Normocephalic and atraumatic.     Right Ear: Tympanic membrane and external ear normal.     Left Ear: Tympanic membrane and external ear normal.     Nose: Nose normal.     Mouth/Throat:     Mouth: Mucous membranes are moist.     Pharynx: Oropharynx is clear.  Eyes:     Conjunctiva/sclera: Conjunctivae normal.  Cardiovascular:     Rate and Rhythm: Normal rate and regular rhythm.     Heart sounds: Normal heart sounds. No murmur heard.    No friction rub. No gallop.  Pulmonary:     Effort: Pulmonary effort is normal.     Breath sounds: Normal breath sounds. No wheezing, rhonchi or rales.  Musculoskeletal:     Cervical back: Neck supple.  Skin:    General: Skin is warm.     Findings: No rash.  Neurological:     Mental Status: She is alert and oriented to person, place, and time.  Psychiatric:        Behavior: Behavior normal.   The plan was reviewed with the patient/family, and all questions/concerned were addressed.  It was my pleasure to see Nashalie today and participate in her care. Please feel free to contact me with any questions or concerns.  Sincerely,  Wyline Mood, DO Allergy & Immunology  [facility] office: [phone] [facility] office: [phone]

## 2022-07-25 NOTE — Patient Instructions (Addendum)
Today's skin testing showed: Borderline positive to trout, tuna, salmon and oyster. Negative to soy, sesame and mushroom.   Results given.  Food allergies Start strict avoidance of seafood for now.  Avoid mushroom, soy and sesame. Get bloodwork and if negative will recommend at home reintroduction of mushroom, soy and sesame only.  We are ordering labs, so please allow 1-2 weeks for the results to come back. With the newly implemented Cures Act, the labs might be visible to you at the same time that they become visible to me. However, I will not address the results until all of the results are back, so please be patient.  In the meantime, continue recommendations in your patient instructions, including avoidance measures (if applicable), until you hear from me. I have prescribed epinephrine injectable device and demonstrated proper use. For mild symptoms you can take over the counter antihistamines such as Benadryl and monitor symptoms closely. If symptoms worsen or if you have severe symptoms including breathing issues, throat closure, significant swelling, whole body hives, severe diarrhea and vomiting, lightheadedness then inject epinephrine and seek immediate medical care afterwards. Emergency action plan given.  Breathing Normal breathing test today. May use albuterol rescue inhaler 2 puffs or nebulizer every 4 to 6 hours as needed for shortness of breath, chest tightness, coughing, and wheezing. May use albuterol rescue inhaler 2 puffs 5 to 15 minutes prior to strenuous physical activities. Monitor frequency of use - if you need to use it more than twice per week on a consistent basis other than exercise related let us know.   Environmental allergies Use over the counter antihistamines such as Zyrtec (cetirizine), Claritin (loratadine), Allegra (fexofenadine), or Xyzal (levocetirizine) daily as needed. May take twice a day during allergy flares. May switch antihistamines every few  months. If worsening symptoms then recommend environmental allergy testing.  Follow up in 12 months or sooner if needed.

## 2022-07-25 NOTE — Assessment & Plan Note (Signed)
Reaction after a wide variety of foods at a [place] buffet - mainly hives. Symptoms eventually resolved with prednisone. No other reactions. Denies changes in meds, personal care products or recent infections. [name] is a new thing she ate but she eats seafood with no issues. Since above incident avoiding seafood, mushroom and no soy and sesame ingestion. Had cheeseburger this week with no issues Today's skin testing showed: Borderline positive to trout, tuna, salmon and oyster. Negative to soy, sesame and mushroom.  Start strict avoidance of seafood for now.  Avoid mushroom, soy and sesame. Get bloodwork and if negative will recommend at home reintroduction of mushroom, soy and sesame only. For the seafood will need in office food challenge if meets criteria.  I have prescribed epinephrine injectable device and demonstrated proper use. For mild symptoms you can take over the counter antihistamines such as Benadryl and monitor symptoms closely. If symptoms worsen or if you have severe symptoms including breathing issues, throat closure, significant swelling, whole body hives, severe diarrhea and vomiting, lightheadedness then inject epinephrine and seek immediate medical care afterwards. Emergency action plan given.

## 2022-07-25 NOTE — Assessment & Plan Note (Signed)
Some mild symptoms in the spring and fall. Takes Claritin prn with good benefit. No prior testing. Offered environmental panel testing today - declined.  Use over the counter antihistamines such as Zyrtec (cetirizine), Claritin (loratadine), Allegra (fexofenadine), or Xyzal (levocetirizine) daily as needed. May take twice a day during allergy flares. May switch antihistamines every few months. If worsening symptoms then recommend environmental allergy testing.

## 2022-08-05 LAB — ALLERGEN PROFILE, SHELLFISH
Clam IgE: <0.1 kU/L
F023-IgE Crab: <0.1 kU/L
F080-IgE Lobster: <0.1 kU/L
F290-IgE Oyster: <0.1 kU/L
Scallop IgE: <0.1 kU/L
Shrimp IgE: <0.1 kU/L

## 2022-08-05 LAB — ALLERGEN PROFILE, FOOD-FISH
Allergen Mackerel IgE: <0.1 kU/L
Allergen Salmon IgE: <0.1 kU/L
Allergen Trout IgE: <0.1 kU/L
Allergen Walley Pike IgE: <0.1 kU/L
Codfish IgE: <0.1 kU/L
Halibut IgE: <0.1 kU/L
Tuna: <0.1 kU/L

## 2022-08-05 LAB — TRYPTASE: Tryptase: 6 ug/L (ref 2.2–13.2)

## 2022-08-05 LAB — ALLERGEN, MUSHROOM, RF212: Mushroom IgE: <0.1 kU/L

## 2022-08-05 LAB — ALLERGEN SESAME F10: Sesame Seed IgE: <0.1 kU/L

## 2022-08-05 LAB — ALLERGEN SOYBEAN: Soybean IgE: <0.1 kU/L

## 2022-09-06 NOTE — Progress Notes (Deleted)
Follow Up Note  RE: Julie Cummings MRN: [id number] DOB: May 15, 1990 Date of Office Visit: 09/07/2022  Referring provider: Natalia Leatherwood, DO Primary care provider: Natalia Leatherwood, DO  Chief Complaint:No chief complaint on file.   Assessment and Plan: Julie Cummings is a 32 y.o. female with: No problem-specific Assessment & Plan notes found for this encounter.  No follow-ups on file.  Challenge food: *** Challenge as per protocol: {Blank single:19197::"Passed","Failed"} Total time: ***  Do not eat challenge food for next 24 hours and monitor for hives, swelling, shortness of breath and dizziness. If you see these symptoms, use Benadryl for mild symptoms and epinephrine for more severe symptoms and call 911.  If no adverse symptoms in the next 24 hours, repeat the challenge food the next day and observe for 1 hour. If no adverse symptoms, can eat the food on regular basis.   History of Present Illness: I had the pleasure of seeing Julie Cummings for a follow up visit at the [facility] on 09/06/2022. She is a 32 y.o. female, who is being followed for allergic reaction, exercise-induced asthma and allergic rhinitis. Her previous allergy office visit was on 07/25/2022 with Dr. Selena Batten. Today she is here for shrimp food challenge.   History of Reaction:   Blood work was negative to finned fish and shellfish food panel.  Also negative to mushroom, soy and sesame seed. Okay to reintroduce mushroom, soy and sesame at home one by one. For the seafood, recommend in office food challenge given your clinical reaction. Allergic reaction Reaction after a wide variety of foods at a [place] buffet - mainly hives. Symptoms eventually resolved with prednisone. No other reactions. Denies changes in meds, personal care products or recent infections. [name] is a new thing she ate but she eats seafood with no issues. Since above incident avoiding seafood, mushroom and no soy and sesame ingestion.  Had cheeseburger this week with no issues Today's skin testing showed: Borderline positive to trout, tuna, salmon and oyster. Negative to soy, sesame and mushroom.  Start strict avoidance of seafood for now.  Avoid mushroom, soy and sesame. Get bloodwork and if negative will recommend at home reintroduction of mushroom, soy and sesame only. For the seafood will need in office food challenge if meets criteria.  I have prescribed epinephrine injectable device and demonstrated proper use. For mild symptoms you can take over the counter antihistamines such as Benadryl and monitor symptoms closely. If symptoms worsen or if you have severe symptoms including breathing issues, throat closure, significant swelling, whole body hives, severe diarrhea and vomiting, lightheadedness then inject epinephrine and seek immediate medical care afterwards. Emergency action plan given.   Exercise-induced asthma Mainly flares with exertion and URIs. Today's spirometry was normal but just used albuterol a few hours ago. May use albuterol rescue inhaler 2 puffs or nebulizer every 4 to 6 hours as needed for shortness of breath, chest tightness, coughing, and wheezing. May use albuterol rescue inhaler 2 puffs 5 to 15 minutes prior to strenuous physical activities. Monitor frequency of use - if you need to use it more than twice per week on a consistent basis other than exercise related let us know.    Other allergic rhinitis Some mild symptoms in the spring and fall. Takes Claritin prn with good benefit. No prior testing. Offered environmental panel testing today - declined.  Use over the counter antihistamines such as Zyrtec (cetirizine), Claritin (loratadine), Allegra (fexofenadine), or Xyzal (levocetirizine) daily as  needed. May take twice a day during allergy flares. May switch antihistamines every few months. If worsening symptoms then recommend environmental allergy testing.   Return in about 1 year (around  07/25/2023).  Labs/skin testing: Component     Latest Ref Rng 08/01/2022  Clam IgE     Class 0 kU/L <0.10   F023-IgE Crab     Class 0 kU/L <0.10   Shrimp IgE     Class 0 kU/L <0.10   Scallop IgE     Class 0 kU/L <0.10   F290-IgE Oyster     Class 0 kU/L <0.10   F080-IgE Lobster     Class 0 kU/L <0.10     Component     Latest Ref Rng 08/01/2022  Codfish IgE     Class 0 kU/L <0.10   Halibut IgE     Class 0 kU/L <0.10   Allergen [place] IgE     Class 0 kU/L <0.10   Tuna     Class 0 kU/L <0.10   Allergen Salmon IgE     Class 0 kU/L <0.10   Allergen Mackerel IgE     Class 0 kU/L <0.10   Allergen Trout IgE     Class 0 kU/L <0.10     Interval History: Patient has not been ill, she has not had any accidental exposures to the culprit food.   Recent/Current History: Pulmonary disease: {Blank single:19197::"yes","no"} Cardiac disease: {Blank single:19197::"yes","no"} Respiratory infection: {Blank single:19197::"yes","no"} Rash: {Blank single:19197::"yes","no"} Itch: {Blank single:19197::"yes","no"} Swelling: {Blank single:19197::"yes","no"} Cough: {Blank single:19197::"yes","no"} Shortness of breath: {Blank single:19197::"yes","no"} Runny/stuffy nose: {Blank single:19197::"yes","no"} Itchy eyes: {Blank single:19197::"yes","no"} Beta-blocker use: {Blank single:19197::"yes","no"}  Patient/guardian was informed of the test procedure with verbalized understanding of the risk of anaphylaxis. Consent was signed.   Last antihistamine use: *** Last beta-blocker use: ***  Medication List:  Current Outpatient Medications  Medication Sig Dispense Refill   albuterol (VENTOLIN HFA) 108 (90 Base) MCG/ACT inhaler INHALE 1-2 PUFFS INTO THE LUNGS EVERY 6 HOURS AS NEEDED. 18 each 5   amoxicillin-clavulanate (AUGMENTIN) 875-125 MG tablet Take 1 tablet by mouth 2 (two) times daily. 14 tablet 0   AUVI-Q 0.3 MG/0.3ML SOAJ injection Inject 0.3 mg into the muscle as needed for anaphylaxis.  1 each 1   busPIRone (BUSPAR) 10 MG tablet Take 1 tablet (10 mg total) by mouth 2 (two) times daily. 180 tablet 1   EPINEPHRINE 0.3 mg/0.3 mL IJ SOAJ injection INJECT 0.3 MG INTO THE MUSCLE AS NEEDED FOR ANAPHYLAXIS. 2 each 1   escitalopram (LEXAPRO) 20 MG tablet Take 1 tablet (20 mg total) by mouth daily. 90 tablet 1   ipratropium (ATROVENT) 0.03 % nasal spray Place 2 sprays into both nostrils every 12 (twelve) hours. 30 mL 0   Norethindrone-Ethinyl Estradiol-Fe Biphas (LO LOESTRIN FE) 1 MG-10 MCG / 10 MCG tablet 1 po daily 28 tablet 11   No current facility-administered medications for this visit.    Allergies: Allergies  Allergen Reactions   Morphine And Codeine Other (See Comments) and Palpitations    Racing heart; when patient had gallbladder taken out in December 2012, MD told patient that her heart was racing and not to take Morphine again. Other reaction(s): Other (See Comments), Unknown Racing heart; when patient had gallbladder taken out in December 2012, MD told patient that her heart was racing and not to take Morphine again.   Morphine Palpitations    I reviewed her past medical history, social history, family history, and environmental history and  no significant changes have been reported from her previous visit.   Review of Systems  Constitutional:  Negative for appetite change, chills, fever and unexpected weight change.  HENT:  Positive for congestion. Negative for rhinorrhea.   Eyes:  Negative for itching.  Respiratory:  Negative for cough, chest tightness, shortness of breath and wheezing.   Cardiovascular:  Negative for chest pain.  Gastrointestinal:  Negative for abdominal pain.  Genitourinary:  Negative for difficulty urinating.  Skin:  Negative for rash.  Neurological:  Negative for headaches.    Objective: There were no vitals taken for this visit. There is no height or weight on file to calculate BMI. Physical Exam Vitals and nursing note reviewed.   Constitutional:      Appearance: Normal appearance. She is well-developed.  HENT:     Head: Normocephalic and atraumatic.     Right Ear: Tympanic membrane and external ear normal.     Left Ear: Tympanic membrane and external ear normal.     Nose: Nose normal.     Mouth/Throat:     Mouth: Mucous membranes are moist.     Pharynx: Oropharynx is clear.  Eyes:     Conjunctiva/sclera: Conjunctivae normal.  Cardiovascular:     Rate and Rhythm: Normal rate and regular rhythm.     Heart sounds: Normal heart sounds. No murmur heard.    No friction rub. No gallop.  Pulmonary:     Effort: Pulmonary effort is normal.     Breath sounds: Normal breath sounds. No wheezing, rhonchi or rales.  Musculoskeletal:     Cervical back: Neck supple.  Skin:    General: Skin is warm.     Findings: No rash.  Neurological:     Mental Status: She is alert and oriented to person, place, and time.  Psychiatric:        Behavior: Behavior normal.     Diagnostics: Spirometry:  Tracings reviewed. Her effort: {Blank single:19197::"Good reproducible efforts.","It was hard to get consistent efforts and there is a question as to whether this reflects a maximal maneuver.","Poor effort, data can not be interpreted."} FVC: ***L FEV1: ***L, ***% predicted FEV1/FVC ratio: ***% Interpretation: {Blank single:19197::"Spirometry consistent with mild obstructive disease","Spirometry consistent with moderate obstructive disease","Spirometry consistent with severe obstructive disease","Spirometry consistent with possible restrictive disease","Spirometry consistent with mixed obstructive and restrictive disease","Spirometry uninterpretable due to technique","Spirometry consistent with normal pattern","No overt abnormalities noted given today's efforts"}.  Please see scanned spirometry results for details.  Skin Testing: {Blank single:19197::"None","Deferred due to recent antihistamines use"}. Positive test to: ***. Negative  test to: ***.  Results discussed with patient/family.   Previous notes and tests were reviewed. The plan was reviewed with the patient/family, and all questions/concerned were addressed.  It was my pleasure to see Julie Cummings today and participate in her care. Please feel free to contact me with any questions or concerns.  Sincerely,  Wyline Mood, DO Allergy & Immunology  [facility] [place] office: [phone] [facility] office: [phone]

## 2022-09-07 ENCOUNTER — Encounter: Payer: BC Managed Care – PPO | Admitting: Allergy

## 2022-09-07 DIAGNOSIS — J3089 Other allergic rhinitis: Secondary | ICD-10-CM

## 2022-09-07 DIAGNOSIS — T7840XD Allergy, unspecified, subsequent encounter: Secondary | ICD-10-CM

## 2022-09-07 DIAGNOSIS — T781XXD Other adverse food reactions, not elsewhere classified, subsequent encounter: Secondary | ICD-10-CM

## 2022-09-07 DIAGNOSIS — J45990 Exercise induced bronchospasm: Secondary | ICD-10-CM

## 2022-09-11 ENCOUNTER — Other Ambulatory Visit: Payer: Self-pay | Admitting: Family Medicine

## 2022-10-18 ENCOUNTER — Telehealth: Payer: BC Managed Care – PPO | Admitting: Physician Assistant

## 2022-10-18 DIAGNOSIS — J019 Acute sinusitis, unspecified: Secondary | ICD-10-CM

## 2022-10-18 DIAGNOSIS — B9689 Other specified bacterial agents as the cause of diseases classified elsewhere: Secondary | ICD-10-CM | POA: Diagnosis not present

## 2022-10-18 MED ORDER — AMOXICILLIN-POT CLAVULANATE 875-125 MG PO TABS
1.0000 | ORAL_TABLET | Freq: Two times a day (BID) | ORAL | 0 refills | Status: DC
Start: 2022-10-18 — End: 2022-12-18

## 2022-10-18 NOTE — Progress Notes (Signed)
I have spent 5 minutes in review of e-visit questionnaire, review and updating patient chart, medical decision making and response to patient.   Mia Milan Cody Jacklynn Dehaas, PA-C    

## 2022-10-18 NOTE — Progress Notes (Signed)

## 2022-11-05 ENCOUNTER — Telehealth: Payer: BC Managed Care – PPO | Admitting: Physician Assistant

## 2022-11-05 DIAGNOSIS — B9689 Other specified bacterial agents as the cause of diseases classified elsewhere: Secondary | ICD-10-CM | POA: Diagnosis not present

## 2022-11-05 DIAGNOSIS — J028 Acute pharyngitis due to other specified organisms: Secondary | ICD-10-CM | POA: Diagnosis not present

## 2022-11-05 MED ORDER — AZITHROMYCIN 250 MG PO TABS: ORAL_TABLET | ORAL | 0 refills | Status: AC

## 2022-11-05 NOTE — Progress Notes (Signed)

## 2022-12-14 ENCOUNTER — Telehealth: Payer: Self-pay

## 2022-12-14 NOTE — Telephone Encounter (Signed)
LVM to reschedule.

## 2022-12-18 ENCOUNTER — Ambulatory Visit (INDEPENDENT_AMBULATORY_CARE_PROVIDER_SITE_OTHER): Payer: BC Managed Care – PPO | Admitting: Family Medicine

## 2022-12-18 DIAGNOSIS — Z91199 Patient's noncompliance with other medical treatment and regimen due to unspecified reason: Secondary | ICD-10-CM

## 2022-12-18 NOTE — Progress Notes (Signed)
  Same day cancel 

## 2022-12-20 ENCOUNTER — Ambulatory Visit: Payer: BC Managed Care – PPO | Admitting: Family Medicine

## 2022-12-22 ENCOUNTER — Telehealth: Payer: BC Managed Care – PPO | Admitting: Physician Assistant

## 2022-12-22 DIAGNOSIS — J019 Acute sinusitis, unspecified: Secondary | ICD-10-CM

## 2022-12-22 DIAGNOSIS — B9689 Other specified bacterial agents as the cause of diseases classified elsewhere: Secondary | ICD-10-CM | POA: Diagnosis not present

## 2022-12-22 MED ORDER — AMOXICILLIN-POT CLAVULANATE 875-125 MG PO TABS
1.0000 | ORAL_TABLET | Freq: Two times a day (BID) | ORAL | 0 refills | Status: DC
Start: 2022-12-22 — End: 2023-05-03

## 2022-12-22 NOTE — Progress Notes (Signed)

## 2022-12-25 ENCOUNTER — Ambulatory Visit: Payer: BC Managed Care – PPO | Admitting: Family Medicine

## 2023-01-02 ENCOUNTER — Encounter: Payer: Self-pay | Admitting: Family Medicine

## 2023-01-02 ENCOUNTER — Ambulatory Visit (INDEPENDENT_AMBULATORY_CARE_PROVIDER_SITE_OTHER): Payer: BC Managed Care – PPO | Admitting: Family Medicine

## 2023-01-02 VITALS — BP 122/82 | HR 84 | Temp 97.9°F | Wt 257.6 lb

## 2023-01-02 DIAGNOSIS — F419 Anxiety disorder, unspecified: Secondary | ICD-10-CM | POA: Diagnosis not present

## 2023-01-02 MED ORDER — BUSPIRONE HCL 10 MG PO TABS: 10.0000 mg | ORAL_TABLET | Freq: Two times a day (BID) | ORAL | 2 refills | Status: DC

## 2023-01-02 MED ORDER — ESCITALOPRAM OXALATE 20 MG PO TABS: 20.0000 mg | ORAL_TABLET | Freq: Every day | ORAL | 2 refills | Status: DC

## 2023-01-02 NOTE — Patient Instructions (Addendum)
Return in about 6 months (around 07/11/2023) for cpe (20 min), Routine chronic condition follow-up.        Great to see you today.  I have refilled the medication(s) we provide.   If labs were collected or images ordered, we will inform you of  results once we have received them and reviewed. We will contact you either by echart message, or telephone call.  Please give ample time to the testing facility, and our office to run,  receive and review results. Please do not call inquiring of results, even if you can see them in your chart. We will contact you as soon as we are able. If it has been over 1 week since the test was completed, and you have not yet heard from [name], then please call us.    - echart message- for normal results that have been seen by the patient already.   - telephone call: abnormal results or if patient has not viewed results in their echart.  If a referral to a specialist was entered for you, please call us in 2 weeks if you have not heard from the specialist office to schedule.

## 2023-01-02 NOTE — Progress Notes (Signed)
Patient ID: Julie Cummings, female  DOB: February 15, 1990, 32 y.o.   MRN: [id number] Patient Care Team    Relationship Specialty Notifications Start End  Natalia Leatherwood, DO PCP - General Family Medicine  02/04/20   Jacklyn Shell, CNM Midwife Certified Nurse Midwife  02/04/20    Comment: ob/gyn- family tree    Chief Complaint  Patient presents with   Anxiety    Subjective:  Julie Cummings is a 32 y.o.  Female  present for chronic condition, combination appointment All past medical history, surgical history, allergies, family history, immunizations, medications and social history were updated in the electronic medical record today. All recent labs, ED visits and hospitalizations within the last year were reviewed.   anxiety/palpitations: Patient is compliant with Lexapro 20 mg qd, and she feels this has been helpful with her anxiety     01/02/2023    7:52 AM 07/10/2022   11:30 AM 07/10/2022    8:51 AM 06/28/2022    9:40 AM 11/28/2021    8:53 AM  Depression screen PHQ 2/9  Decreased Interest 0 0 0 0 1  Down, Depressed, Hopeless 0 0 0 0 0  PHQ - 2 Score 0 0 0 0 1  Altered sleeping  0 1 0 1  Tired, decreased energy  1 2 1 2   Change in appetite  1 2 2  0  Feeling bad or failure about yourself   0 0 0 0  Trouble concentrating  0 0 0 0  Moving slowly or fidgety/restless  0 0 0 0  Suicidal thoughts  0 0 0 0  PHQ-9 Score  2 5 3 4   Difficult doing work/chores   Not difficult at all Not difficult at all       01/02/2023    7:52 AM 07/10/2022   11:31 AM 07/10/2022    8:51 AM 06/28/2022    9:40 AM  GAD 7 : Generalized Anxiety Score  Nervous, Anxious, on Edge 1 1 3 2   Control/stop worrying 0 1 2 1   Worry too much - different things 0 1 2 2   Trouble relaxing 0 0 1 0  Restless 0 0 0 0  Easily annoyed or irritable 0 0 1 2  Afraid - awful might happen 0 1 2 2   Total GAD 7 Score 1 4 11 9   Anxiety Difficulty Not difficult at all  Somewhat difficult Somewhat difficult    Immunization History  Administered Date(s) Administered   Influenza,inj,Quad PF,6+ Mos 11/05/2012   MMR 12/02/2010   PPD Test 09/14/2019   Rho (D) Immune Globulin 12/02/2010, 09/24/2012, 11/19/2012   Tdap 12/02/2010, 11/20/2012, 05/20/2015    Past Medical History:  Diagnosis Date   Allergy    Chronic headaches    Depression    Eczema    Gall stones    GERD (gastroesophageal reflux disease)    History of UTI    Palpitations 02/04/2020   Preeclampsia    Rh negative state in antepartum period 10/07/2014   B-   Rhogam after 28wks: 04/27/2015     Allergies  Allergen Reactions   Morphine And Codeine Other (See Comments) and Palpitations    Racing heart; when patient had gallbladder taken out in December 2012, MD told patient that her heart was racing and not to take Morphine again. Other reaction(s): Other (See Comments), Unknown Racing heart; when patient had gallbladder taken out in December 2012, MD told patient that her heart was racing and not to take Morphine  again.   Morphine Palpitations   Past Surgical History:  Procedure Laterality Date   ADENOIDECTOMY     CHOLECYSTECTOMY  12/2010   ERCP     TONSILLECTOMY  1977   Family History  Problem Relation Age of Onset   Diabetes Mother    Arthritis Mother    Hearing loss Mother    Hypertension Mother    Diabetes Father    Hypertension Sister    Arthritis Sister    Depression Sister    Miscarriages / [place] Sister    Miscarriages / [place] Sister    Thyroid disease Maternal Grandmother    CAD Maternal Grandfather    Diabetes Maternal Grandfather    Stroke Maternal Grandfather    Heart attack Maternal Grandfather    Heart disease Maternal Grandfather    Hearing loss Maternal Grandfather    Hyperlipidemia Maternal Grandfather    Hypertension Maternal Grandfather    Heart disease Paternal Grandmother    Diabetes Paternal Grandfather    Heart disease Paternal Grandfather    Stroke Paternal Grandfather     Heart attack Paternal Grandfather    Allergic rhinitis Son    Asthma Son    Asthma Son    Hypertension Other        grandparents   Social History   Social History Narrative   Marital status/children/pets: Married, 3 children   Education/employment: Automotive engineer, TEFL teacher:      - wears seatbelt: Yes     - Feels safe in their relationships: Yes    Allergies as of 01/02/2023       Reactions   Morphine And Codeine Other (See Comments), Palpitations   Racing heart; when patient had gallbladder taken out in December 2012, MD told patient that her heart was racing and not to take Morphine again. Other reaction(s): Other (See Comments), Unknown Racing heart; when patient had gallbladder taken out in December 2012, MD told patient that her heart was racing and not to take Morphine again.   Morphine Palpitations        Medication List        Accurate as of January 02, 2023  8:06 AM. If you have any questions, ask your nurse or doctor.          albuterol 108 (90 Base) MCG/ACT inhaler Commonly known as: VENTOLIN HFA INHALE 1-2 PUFFS INTO THE LUNGS EVERY 6 HOURS AS NEEDED.   amoxicillin-clavulanate 875-125 MG tablet Commonly known as: AUGMENTIN Take 1 tablet by mouth 2 (two) times daily.   busPIRone 10 MG tablet Commonly known as: BUSPAR Take 1 tablet (10 mg total) by mouth 2 (two) times daily.   EPINEPHrine 0.3 mg/0.3 mL Soaj injection Commonly known as: EPI-PEN INJECT 0.3 MG INTO THE MUSCLE AS NEEDED FOR ANAPHYLAXIS.   escitalopram 20 MG tablet Commonly known as: LEXAPRO Take 1 tablet (20 mg total) by mouth daily.   Lo Loestrin Fe 1 MG-10 MCG / 10 MCG tablet Generic drug: Norethindrone-Ethinyl Estradiol-Fe Biphas 1 po daily        All past medical history, surgical history, allergies, family history, immunizations andmedications were updated in the EMR today and reviewed under the history and medication portions of their EMR.      No results  found.  ROS 14 pt review of systems performed and negative (unless mentioned in an HPI)  Objective: BP 122/82   Pulse 84   Temp 97.9 F (36.6 C)   Wt 257 lb 9.6 oz (116.8 kg)  SpO2 99%   BMI 42.87 kg/m  Physical Exam Vitals and nursing note reviewed.  Constitutional:      General: She is not in acute distress.    Appearance: Normal appearance. She is normal weight. She is not ill-appearing or toxic-appearing.  HENT:     Head: Normocephalic and atraumatic.  Eyes:     General: No scleral icterus.       Right eye: No discharge.        Left eye: No discharge.     Extraocular Movements: Extraocular movements intact.     Conjunctiva/sclera: Conjunctivae normal.     Pupils: Pupils are equal, round, and reactive to light.  Skin:    Findings: No rash.  Neurological:     Mental Status: She is alert and oriented to person, place, and time. Mental status is at baseline.     Motor: No weakness.     Coordination: Coordination normal.     Gait: Gait normal.  Psychiatric:        Mood and Affect: Mood normal.        Behavior: Behavior normal.        Thought Content: Thought content normal.        Judgment: Judgment normal.     No results found.  Assessment/plan: Julie Cummings is a 32 y.o. female present for chronic condition follow-up Anxiety stable Continue Lexapro 20  mg qd Continue buspar 10 mg bid.  Tried in past: Zoloft, wellbutrin  Follow-up: Chronic conditions only/anxiety can be virtual if patient desires.  Return in about 6 months (around 07/11/2023) for cpe (20 min), Routine chronic condition follow-up.  No orders of the defined types were placed in this encounter.  Meds ordered this encounter  Medications   busPIRone (BUSPAR) 10 MG tablet    Sig: Take 1 tablet (10 mg total) by mouth 2 (two) times daily.    Dispense:  180 tablet    Refill:  2   escitalopram (LEXAPRO) 20 MG tablet    Sig: Take 1 tablet (20 mg total) by mouth daily.    Dispense:  90 tablet     Refill:  2   Referral Orders  No referral(s) requested today    Electronically signed by: Felix Pacini, DO [facility]

## 2023-01-12 ENCOUNTER — Telehealth: Payer: BC Managed Care – PPO | Admitting: Physician Assistant

## 2023-01-12 DIAGNOSIS — J069 Acute upper respiratory infection, unspecified: Secondary | ICD-10-CM

## 2023-01-12 DIAGNOSIS — B9689 Other specified bacterial agents as the cause of diseases classified elsewhere: Secondary | ICD-10-CM | POA: Diagnosis not present

## 2023-01-12 MED ORDER — BENZONATATE 100 MG PO CAPS
100.0000 mg | ORAL_CAPSULE | Freq: Three times a day (TID) | ORAL | 0 refills | Status: DC | PRN
Start: 2023-01-12 — End: 2023-05-03

## 2023-01-12 MED ORDER — AZITHROMYCIN 250 MG PO TABS
ORAL_TABLET | ORAL | 0 refills | Status: AC
Start: 2023-01-12 — End: 2023-01-17

## 2023-01-12 NOTE — Progress Notes (Signed)
E-Visit for Cough  We are sorry that you are not feeling well.  Here is how we plan to help!  Based on your presentation I believe you most likely have A cough due to bacteria.  When patients have a fever and a productive cough with a change in color or increased sputum production, we are concerned about bacterial bronchitis.  If left untreated it can progress to pneumonia.  If your symptoms do not improve with your treatment plan it is important that you contact your provider.   I have prescribed Azithromyin 250 mg: two tablets now and then one tablet daily for 4 additonal days    In addition you may use A non-prescription cough medication called Mucinex DM: take 2 tablets every 12 hours. and A prescription cough medication called Tessalon Perles 100mg . You may take 1-2 capsules every 8 hours as needed for your cough.  From your responses in the eVisit questionnaire you describe inflammation in the upper respiratory tract which is causing a significant cough.  This is commonly called Bronchitis and has four common causes:   Allergies Viral Infections Acid Reflux Bacterial Infection Allergies, viruses and acid reflux are treated by controlling symptoms or eliminating the cause. An example might be a cough caused by taking certain blood pressure medications. You stop the cough by changing the medication. Another example might be a cough caused by acid reflux. Controlling the reflux helps control the cough.     HOME CARE Only take medications as instructed by your medical team. Complete the entire course of an antibiotic. Drink plenty of fluids and get plenty of rest. Avoid close contacts especially the very young and the elderly Cover your mouth if you cough or cough into your sleeve. Always remember to wash your hands A steam or ultrasonic humidifier can help congestion.   GET HELP RIGHT AWAY IF: You develop worsening fever. You become short of breath You cough up blood. Your symptoms  persist after you have completed your treatment plan MAKE SURE YOU  Understand these instructions. Will watch your condition. Will get help right away if you are not doing well or get worse.    Thank you for choosing an e-visit.  Your e-visit answers were reviewed by a board certified advanced clinical practitioner to complete your personal care plan. Depending upon the condition, your plan could have included both over the counter or prescription medications.  Please review your pharmacy choice. Make sure the pharmacy is open so you can pick up prescription now. If there is a problem, you may contact your provider through [employer] and have the prescription routed to another pharmacy.  Your safety is important to [name]. If you have drug allergies check your prescription carefully.   For the next 24 hours you can use MyChart to ask questions about today's visit, request a non-urgent call back, or ask for a work or school excuse. You will get an email in the next two days asking about your experience. I hope that your e-visit has been valuable and will speed your recovery.  I have spent 5 minutes in review of e-visit questionnaire, review and updating patient chart, medical decision making and response to patient.   Margaretann Loveless, PA-C

## 2023-04-21 ENCOUNTER — Telehealth: Admitting: Nurse Practitioner

## 2023-04-21 DIAGNOSIS — J069 Acute upper respiratory infection, unspecified: Secondary | ICD-10-CM

## 2023-04-21 MED ORDER — IPRATROPIUM BROMIDE 0.03 % NA SOLN
2.0000 | Freq: Two times a day (BID) | NASAL | 12 refills | Status: AC
Start: 2023-04-21 — End: ?

## 2023-04-21 NOTE — Progress Notes (Signed)
 E-Visit for Upper Respiratory Infection   We are sorry you are not feeling well.  Here is how we plan to help!  Based on what you have shared with me, it looks like you may have a viral upper respiratory infection.  Upper respiratory infections are caused by a large number of viruses; however, rhinovirus is the most common cause.   Symptoms vary from person to person, with common symptoms including sore throat, cough, fatigue or lack of energy and feeling of general discomfort.  A low-grade fever of up to 100.4 may present, but is often uncommon.  Symptoms vary however, and are closely related to a person's age or underlying illnesses.  The most common symptoms associated with an upper respiratory infection are nasal discharge or congestion, cough, sneezing, headache and pressure in the ears and face.  These symptoms usually persist for about 3 to 10 days, but can last up to 2 weeks.  It is important to know that upper respiratory infections do not cause serious illness or complications in most cases.   Providers prescribe antibiotics to treat infections caused by bacteria. Antibiotics are very powerful in treating bacterial infections when they are used properly. To maintain their effectiveness, they should be used only when necessary. Overuse of antibiotics has resulted in the development of superbugs that are resistant to treatment!    After careful review of your answers, I would not recommend an antibiotic for your condition.  Antibiotics are not effective against viruses and therefore should not be used to treat them. Common examples of infections caused by viruses include colds and flu   Upper respiratory infections can be transmitted from person to person, with the most common method of transmission being a person's hands.  The virus is able to live on the skin and can infect other persons for up to 2 hours after direct contact.  Also, these can be transmitted when someone coughs or sneezes; thus,  it is important to cover the mouth to reduce this risk.  To keep the spread of the illness at bay, good hand hygiene is very important.  This is an infection that is most likely caused by a virus. There are no specific treatments other than to help you with the symptoms until the infection runs its course.  We are sorry you are not feeling well.  Here is how we plan to help!   For nasal congestion, you may use an oral decongestants such as Mucinex D or if you have glaucoma or high blood pressure use plain Mucinex.  Saline nasal spray or nasal drops can help and can safely be used as often as needed for congestion.  For your congestion, I have prescribed Ipratropium Bromide nasal spray 0.03% two sprays in each nostril 2-3 times a day  If you do not have a history of heart disease, hypertension, diabetes or thyroid disease, prostate/bladder issues or glaucoma, you may also use Sudafed to treat nasal congestion.  It is highly recommended that you consult with a pharmacist or your primary care physician to ensure this medication is safe for you to take.      If you have a sore or scratchy throat, use a saltwater gargle-  to  teaspoon of salt dissolved in a 4-ounce to 8-ounce glass of warm water.  Gargle the solution for approximately 15-30 seconds and then spit.  It is important not to swallow the solution.  You can also use throat lozenges/cough drops and Chloraseptic spray to help with  throat pain or discomfort.  Warm or cold liquids can also be helpful in relieving throat pain.  For headache, pain or general discomfort, you can use Ibuprofen or Tylenol as directed.   Some authorities believe that zinc sprays or the use of Echinacea may shorten the course of your symptoms.   HOME CARE Only take medications as instructed by your medical team. Be sure to drink plenty of fluids. Water is fine as well as fruit juices, sodas and electrolyte beverages. You may want to stay away from caffeine or alcohol.  If you are nauseated, try taking small sips of liquids. How do you know if you are getting enough fluid? Your urine should be a pale yellow or almost colorless. Get rest. Taking a steamy shower or using a humidifier may help nasal congestion and ease sore throat pain. You can place a towel over your head and breathe in the steam from hot water coming from a faucet. Using a saline nasal spray works much the same way. Cough drops, hard candies and sore throat lozenges may ease your cough. Avoid close contacts especially the very young and the elderly Cover your mouth if you cough or sneeze Always remember to wash your hands.   GET HELP RIGHT AWAY IF: You develop worsening fever. If your symptoms do not improve within 10 days You develop yellow or green discharge from your nose over 3 days. You have coughing fits You develop a severe head ache or visual changes. You develop shortness of breath, difficulty breathing or start having chest pain Your symptoms persist after you have completed your treatment plan  MAKE SURE YOU  Understand these instructions. Will watch your condition. Will get help right away if you are not doing well or get worse.  Thank you for choosing an e-visit.  Your e-visit answers were reviewed by a board certified advanced clinical practitioner to complete your personal care plan. Depending upon the condition, your plan could have included both over the counter or prescription medications.  Please review your pharmacy choice. Make sure the pharmacy is open so you can pick up prescription now. If there is a problem, you may contact your provider through [employer] and have the prescription routed to another pharmacy.  Your safety is important to [name]. If you have drug allergies check your prescription carefully.   For the next 24 hours you can use MyChart to ask questions about today's visit, request a non-urgent call back, or ask for a work or school excuse. You  will get an email in the next two days asking about your experience. I hope that your e-visit has been valuable and will speed your recovery.

## 2023-04-21 NOTE — Progress Notes (Signed)
 I have spent 5 minutes in review of e-visit questionnaire, review and updating patient chart, medical decision making and response to patient.   Claiborne Rigg, NP

## 2023-05-03 ENCOUNTER — Telehealth: Admitting: Physician Assistant

## 2023-05-03 DIAGNOSIS — B9689 Other specified bacterial agents as the cause of diseases classified elsewhere: Secondary | ICD-10-CM

## 2023-05-03 DIAGNOSIS — J019 Acute sinusitis, unspecified: Secondary | ICD-10-CM

## 2023-05-03 MED ORDER — AMOXICILLIN-POT CLAVULANATE 875-125 MG PO TABS
1.0000 | ORAL_TABLET | Freq: Two times a day (BID) | ORAL | 0 refills | Status: DC
Start: 2023-05-03 — End: 2023-10-26

## 2023-05-03 MED ORDER — BENZONATATE 100 MG PO CAPS: 100.0000 mg | ORAL_CAPSULE | Freq: Three times a day (TID) | ORAL | 0 refills | Status: DC | PRN

## 2023-05-03 NOTE — Progress Notes (Signed)
 I have spent 5 minutes in review of e-visit questionnaire, review and updating patient chart, medical decision making and response to patient.   Piedad Climes, PA-C

## 2023-05-03 NOTE — Progress Notes (Signed)
 E-Visit for Sinus Problems  We are sorry that you are not feeling well.  Here is how we plan to help!  Based on what you have shared with me it looks like you have sinusitis.  Sinusitis is inflammation and infection in the sinus cavities of the head.  Based on your presentation I believe you most likely have Acute Bacterial Sinusitis.  This is an infection caused by bacteria and is treated with antibiotics. I have prescribed Augmentin 875mg /125mg  one tablet twice daily with food, for 7 days. I have also sent in a prescription cough medication to use as directed. You may use an oral decongestant such as Mucinex D or if you have glaucoma or high blood pressure use plain Mucinex. Saline nasal spray help and can safely be used as often as needed for congestion.  If you develop worsening sinus pain, fever or notice severe headache and vision changes, or if symptoms are not better after completion of antibiotic, please schedule an appointment with a health care provider.    Sinus infections are not as easily transmitted as other respiratory infection, however we still recommend that you avoid close contact with loved ones, especially the very young and elderly.  Remember to wash your hands thoroughly throughout the day as this is the number one way to prevent the spread of infection!  Home Care: Only take medications as instructed by your medical team. Complete the entire course of an antibiotic. Do not take these medications with alcohol. A steam or ultrasonic humidifier can help congestion.  You can place a towel over your head and breathe in the steam from hot water coming from a faucet. Avoid close contacts especially the very young and the elderly. Cover your mouth when you cough or sneeze. Always remember to wash your hands.  Get Help Right Away If: You develop worsening fever or sinus pain. You develop a severe head ache or visual changes. Your symptoms persist after you have completed your  treatment plan.  Make sure you Understand these instructions. Will watch your condition. Will get help right away if you are not doing well or get worse.  Thank you for choosing an e-visit.  Your e-visit answers were reviewed by a board certified advanced clinical practitioner to complete your personal care plan. Depending upon the condition, your plan could have included both over the counter or prescription medications.  Please review your pharmacy choice. Make sure the pharmacy is open so you can pick up prescription now. If there is a problem, you may contact your provider through [employer] and have the prescription routed to another pharmacy.  Your safety is important to [name]. If you have drug allergies check your prescription carefully.   For the next 24 hours you can use MyChart to ask questions about today's visit, request a non-urgent call back, or ask for a work or school excuse. You will get an email in the next two days asking about your experience. I hope that your e-visit has been valuable and will speed your recovery.

## 2023-06-24 ENCOUNTER — Telehealth: Admitting: Emergency Medicine

## 2023-06-24 DIAGNOSIS — M545 Low back pain, unspecified: Secondary | ICD-10-CM

## 2023-06-24 MED ORDER — NAPROXEN 500 MG PO TABS: 500.0000 mg | ORAL_TABLET | Freq: Two times a day (BID) | ORAL | 0 refills | Status: DC

## 2023-06-24 NOTE — Progress Notes (Signed)
 E-Visit for Back Pain   We are sorry that you are not feeling well.  Here is how we plan to help!  Based on what you have shared with me it looks like you mostly have acute back pain.  Acute back pain is defined as musculoskeletal pain that can resolve in 1-3 weeks with conservative treatment.  I have prescribed Naprosyn  500 mg take one by mouth twice a day non-steroid anti-inflammatory (NSAID); alternatively, you can continue using ibuprofen  at home per package directions (do not take both ibuprofen  and naprosyn ).   I do not think you should use a muscle relaxer while taking buspar .   You can take soaking in a warm tub of water with Epsom salt in it.   If your back is not getting better, please follow up with Dr. Marylee Snowball or with a sports medicine clinie.   Some patients experience stomach irritation or in increased heartburn with anti-inflammatory drugs.  Back pain is very common.  The pain often gets better over time.  The cause of back pain is usually not dangerous.  Most people can learn to manage their back pain on their own.  Home Care Stay active.  Start with short walks on flat ground if you can.  Try to walk farther each day. Do not sit, drive or stand in one place for more than 30 minutes.  Do not stay in bed. Do not avoid exercise or work.  Activity can help your back heal faster. Be careful when you bend or lift an object.  Bend at your knees, keep the object close to you, and do not twist. Sleep on a firm mattress.  Lie on your side, and bend your knees.  If you lie on your back, put a pillow under your knees. Only take medicines as told by your doctor. Put ice on the injured area. Put ice in a plastic bag Place a towel between your skin and the bag Leave the ice on for 15-20 minutes, 3-4 times a day for the first 2-3 days. 210 After that, you can switch between ice and heat packs. Ask your doctor about back exercises or massage. Avoid feeling anxious or stressed.  Find good  ways to deal with stress, such as exercise.  Get Help Right Way If: Your pain does not go away with rest or medicine. Your pain does not go away in 1 week. You have new problems. You do not feel well. The pain spreads into your legs. You cannot control when you poop (bowel movement) or pee (urinate) You feel sick to your stomach (nauseous) or throw up (vomit) You have belly (abdominal) pain. You feel like you may pass out (faint). If you develop a fever.  Make Sure you: Understand these instructions. Will watch your condition Will get help right away if you are not doing well or get worse.  Your e-visit answers were reviewed by a board certified advanced clinical practitioner to complete your personal care plan.  Depending on the condition, your plan could have included both over the counter or prescription medications.  If there is a problem please reply  once you have received a response from your provider.  Your safety is important to us .  If you have drug allergies check your prescription carefully.    You can use MyChart to ask questions about today's visit, request a non-urgent call back, or ask for a work or school excuse for 24 hours related to this e-Visit. If it has been  greater than 24 hours you will need to follow up with your provider, or enter a new e-Visit to address those concerns.  You will get an e-mail in the next two days asking about your experience.  I hope that your e-visit has been valuable and will speed your recovery. Thank you for using e-visits.  I have spent 5 minutes in review of e-visit questionnaire, review and updating patient chart, medical decision making and response to patient.   Bart Born, PhD, FNP-BC

## 2023-07-01 ENCOUNTER — Other Ambulatory Visit: Payer: Self-pay | Admitting: Advanced Practice Midwife

## 2023-07-25 NOTE — Progress Notes (Deleted)
 Follow Up Note  RE: Julie Cummings MRN: [id number] DOB: 1990/09/12 Date of Office Visit: 07/26/2023  Referring provider: Catherine Charlies LABOR, DO Primary care provider: Catherine Charlies LABOR, DO  Chief Complaint: No chief complaint on file.  History of Present Illness: I had the pleasure of seeing Julie Cummings for a follow up visit at the [facility] on 07/26/2023. She is a 33 y.o. female, who is being followed for allergic reactions, exercise-induced asthma and allergic rhinitis. Her previous allergy  office visit was on 07/25/2022 with Dr. Luke. Today is a regular follow up visit.  Discussed the use of AI scribe software for clinical note transcription with the patient, who gave verbal consent to proceed.  History of Present Illness            ***  Assessment and Plan: Jeydi is a 33 y.o. female with: Allergic reaction Reaction after a wide variety of foods at a [place] buffet - mainly hives. Symptoms eventually resolved with prednisone . No other reactions. Denies changes in meds, personal care products or recent infections. Caviar is a new thing she ate but she eats seafood with no issues. Since above incident avoiding seafood, mushroom and no soy and sesame ingestion. Had cheeseburger this week with no issues Today's skin testing showed: Borderline positive to trout, tuna, salmon and oyster. Negative to soy, sesame and mushroom.  Start strict avoidance of seafood for now.  Avoid mushroom, soy and sesame. Get bloodwork and if negative will recommend at home reintroduction of mushroom, soy and sesame only. For the seafood will need in office food challenge if meets criteria.  I have prescribed epinephrine  injectable device and demonstrated proper use. For mild symptoms you can take over the counter antihistamines such as Benadryl  and monitor symptoms closely. If symptoms worsen or if you have severe symptoms including breathing issues, throat closure, significant swelling,  whole body hives, severe diarrhea and vomiting, lightheadedness then inject epinephrine  and seek immediate medical care afterwards. Emergency action plan given.   Exercise-induced asthma Mainly flares with exertion and URIs. Today's spirometry was normal but just used albuterol  a few hours ago. May use albuterol  rescue inhaler 2 puffs or nebulizer every 4 to 6 hours as needed for shortness of breath, chest tightness, coughing, and wheezing. May use albuterol  rescue inhaler 2 puffs 5 to 15 minutes prior to strenuous physical activities. Monitor frequency of use - if you need to use it more than twice per week on a consistent basis other than exercise related let us  know.    Other allergic rhinitis Some mild symptoms in the spring and fall. Takes Claritin prn with good benefit. No prior testing. Offered environmental panel testing today - declined.  Use over the counter antihistamines such as Zyrtec (cetirizine), Claritin (loratadine), Allegra (fexofenadine), or Xyzal (levocetirizine) daily as needed. May take twice a day during allergy  flares. May switch antihistamines every few months. If worsening symptoms then recommend environmental allergy  testing. Assessment and Plan              No follow-ups on file.  No orders of the defined types were placed in this encounter.  Lab Orders  No laboratory test(s) ordered today    Diagnostics: Spirometry:  Tracings reviewed. Her effort: {Blank single:19197::Good reproducible efforts.,It was hard to get consistent efforts and there is a question as to whether this reflects a maximal maneuver.,Poor effort, data can not be interpreted.} FVC: ***L FEV1: ***L, ***% predicted FEV1/FVC ratio: ***% Interpretation: {Blank single:19197::Spirometry  consistent with mild obstructive disease,Spirometry consistent with moderate obstructive disease,Spirometry consistent with severe obstructive disease,Spirometry consistent with possible  restrictive disease,Spirometry consistent with mixed obstructive and restrictive disease,Spirometry uninterpretable due to technique,Spirometry consistent with normal pattern,No overt abnormalities noted given today's efforts}.  Please see scanned spirometry results for details.  Skin Testing: {Blank single:19197::Select foods,Environmental allergy  panel,Environmental allergy  panel and select foods,Food allergy  panel,None,Deferred due to recent antihistamines use}. *** Results discussed with patient/family.   Medication List:  Current Outpatient Medications  Medication Sig Dispense Refill  . albuterol  (VENTOLIN  HFA) 108 (90 Base) MCG/ACT inhaler INHALE 1-2 PUFFS INTO THE LUNGS EVERY 6 HOURS AS NEEDED. 18 each 5  . amoxicillin -clavulanate (AUGMENTIN ) 875-125 MG tablet Take 1 tablet by mouth 2 (two) times daily. 14 tablet 0  . benzonatate  (TESSALON ) 100 MG capsule Take 1 capsule (100 mg total) by mouth 3 (three) times daily as needed for cough. 30 capsule 0  . busPIRone  (BUSPAR ) 10 MG tablet Take 1 tablet (10 mg total) by mouth 2 (two) times daily. 180 tablet 2  . EPINEPHRINE  0.3 mg/0.3 mL IJ SOAJ injection INJECT 0.3 MG INTO THE MUSCLE AS NEEDED FOR ANAPHYLAXIS. 2 each 1  . escitalopram  (LEXAPRO ) 20 MG tablet Take 1 tablet (20 mg total) by mouth daily. 90 tablet 2  . ipratropium (ATROVENT ) 0.03 % nasal spray Place 2 sprays into both nostrils every 12 (twelve) hours. 30 mL 12  . LO LOESTRIN FE  1 MG-10 MCG / 10 MCG tablet TAKE 1 TABLET BY MOUTH EVERY DAY 28 tablet 11  . naproxen  (NAPROSYN ) 500 MG tablet Take 1 tablet (500 mg total) by mouth 2 (two) times daily with a meal. 20 tablet 0   No current facility-administered medications for this visit.   Allergies: Allergies  Allergen Reactions  . Morphine And Codeine Other (See Comments) and Palpitations    Racing heart; when patient had gallbladder taken out in December 2012, MD told patient that her heart was racing and not  to take Morphine again. Other reaction(s): Other (See Comments), Unknown Racing heart; when patient had gallbladder taken out in December 2012, MD told patient that her heart was racing and not to take Morphine again.  SABRA Morphine Palpitations   I reviewed her past medical history, social history, family history, and environmental history and no significant changes have been reported from her previous visit.  Review of Systems  Constitutional:  Negative for appetite change, chills, fever and unexpected weight change.  HENT:  Positive for congestion. Negative for rhinorrhea.   Eyes:  Negative for itching.  Respiratory:  Negative for cough, chest tightness, shortness of breath and wheezing.   Cardiovascular:  Negative for chest pain.  Gastrointestinal:  Negative for abdominal pain.  Genitourinary:  Negative for difficulty urinating.  Skin:  Negative for rash.  Neurological:  Negative for headaches.   Objective: There were no vitals taken for this visit. There is no height or weight on file to calculate BMI. Physical Exam Vitals and nursing note reviewed.  Constitutional:      Appearance: Normal appearance. She is well-developed.  HENT:     Head: Normocephalic and atraumatic.     Right Ear: Tympanic membrane and external ear normal.     Left Ear: Tympanic membrane and external ear normal.     Nose: Nose normal.     Mouth/Throat:     Mouth: Mucous membranes are moist.     Pharynx: Oropharynx is clear.   Eyes:     Conjunctiva/sclera: Conjunctivae normal.  Cardiovascular:     Rate and Rhythm: Normal rate and regular rhythm.     Heart sounds: Normal heart sounds. No murmur heard.    No friction rub. No gallop.  Pulmonary:     Effort: Pulmonary effort is normal.     Breath sounds: Normal breath sounds. No wheezing, rhonchi or rales.   Musculoskeletal:     Cervical back: Neck supple.   Skin:    General: Skin is warm.     Findings: No rash.   Neurological:     Mental  Status: She is alert and oriented to person, place, and time.   Psychiatric:        Behavior: Behavior normal.  Previous notes and tests were reviewed. The plan was reviewed with the patient/family, and all questions/concerned were addressed.  It was my pleasure to see Ahrianna today and participate in her care. Please feel free to contact me with any questions or concerns.  Sincerely,  Orlan Cramp, DO Allergy  & Immunology  [facility]  [place] office: [phone] [facility] office: [phone]

## 2023-07-26 ENCOUNTER — Ambulatory Visit: Payer: BC Managed Care – PPO | Admitting: Allergy

## 2023-07-26 DIAGNOSIS — J45990 Exercise induced bronchospasm: Secondary | ICD-10-CM

## 2023-07-26 DIAGNOSIS — T7840XD Allergy, unspecified, subsequent encounter: Secondary | ICD-10-CM

## 2023-07-26 DIAGNOSIS — J309 Allergic rhinitis, unspecified: Secondary | ICD-10-CM

## 2023-07-26 DIAGNOSIS — J3089 Other allergic rhinitis: Secondary | ICD-10-CM

## 2023-07-26 DIAGNOSIS — T781XXD Other adverse food reactions, not elsewhere classified, subsequent encounter: Secondary | ICD-10-CM

## 2023-09-07 ENCOUNTER — Other Ambulatory Visit: Payer: Self-pay | Admitting: Family Medicine

## 2023-10-11 ENCOUNTER — Other Ambulatory Visit: Payer: Self-pay | Admitting: Family Medicine

## 2023-10-15 ENCOUNTER — Other Ambulatory Visit: Payer: Self-pay | Admitting: Family Medicine

## 2023-10-15 NOTE — Telephone Encounter (Signed)
 Copied from CRM [id number]. Topic: Clinical - Medication Refill >> Oct 15, 2023  1:09 PM Dedra B wrote: Medication: escitalopram  (LEXAPRO ) 20 MG tablet. Pt would like to enough to get her through to her appt on 9/26  Has the patient contacted their pharmacy? No, out of refills  This is the patient's preferred pharmacy:  [id number] - [place], [place] - [address] [place] [address] Phone: [phone] Fax: [phone]  Is this the correct pharmacy for this prescription? Yes  Has the prescription been filled recently? No  Is the patient out of the medication? Yes, will take last one tonight  Has the patient been seen for an appointment in the last year OR does the patient have an upcoming appointment? Yes, 10/26/23  Can we respond through MyChart? Yes  Agent: Please be advised that Rx refills may take up to 3 business days. We ask that you follow-up with your pharmacy.

## 2023-10-15 NOTE — Telephone Encounter (Signed)
 FYI Only or Action Required?: Action required by provider: medication refill request.  Patient was last seen in primary care on 01/02/2023 by Catherine, Renee A, DO.  Called Nurse Triage reporting No chief complaint on file..  Symptoms began today.  Interventions attempted: Nothing.  Symptoms are: stable.  Triage Disposition: No disposition on file.  Patient/caregiver understands and will follow disposition?:

## 2023-10-18 ENCOUNTER — Telehealth: Payer: Self-pay

## 2023-10-18 MED ORDER — ESCITALOPRAM OXALATE 20 MG PO TABS: 20.0000 mg | ORAL_TABLET | Freq: Every day | ORAL | 0 refills | Status: DC

## 2023-10-18 NOTE — Telephone Encounter (Signed)
 Communication  Medication: escitalopram  (LEXAPRO ) 20 MG tablet. Pt would like to enough to get her through to her appt on 9/26        Has the patient contacted their pharmacy? No, out of refills        This is the patient's preferred pharmacy:    [id number] - [place], [place] - [address]    [address] [place]    [place] [address]    Phone: [phone] Fax: [phone]        Is this the correct pharmacy for this prescription? Yes        Has the prescription been filled recently? No        Is the patient out of the medication? Yes, will take last one tonight        Has the patient been seen for an appointment in the last year OR does the patient have an upcoming appointment? Yes, 10/26/23        Can we respond through MyChart? Yes        Agent: Please be advised that Rx refills may take up to 3 business days. We ask that you follow-up with your pharmacy.   Rx sent.

## 2023-10-26 ENCOUNTER — Ambulatory Visit: Admitting: Family Medicine

## 2023-10-26 ENCOUNTER — Encounter: Payer: Self-pay | Admitting: Family Medicine

## 2023-10-26 VITALS — BP 116/74 | HR 77 | Temp 98.1°F | Wt 227.0 lb

## 2023-10-26 DIAGNOSIS — F419 Anxiety disorder, unspecified: Secondary | ICD-10-CM

## 2023-10-26 DIAGNOSIS — Z23 Encounter for immunization: Secondary | ICD-10-CM | POA: Diagnosis not present

## 2023-10-26 MED ORDER — ESCITALOPRAM OXALATE 20 MG PO TABS: 20.0000 mg | ORAL_TABLET | Freq: Every day | ORAL | 1 refills | Status: AC

## 2023-10-26 MED ORDER — BUSPIRONE HCL 10 MG PO TABS: 10.0000 mg | ORAL_TABLET | Freq: Two times a day (BID) | ORAL | 2 refills | Status: AC

## 2023-10-26 NOTE — Progress Notes (Signed)
 Patient ID: Julie Cummings, female  DOB: 11/19/1990, 33 y.o.   MRN: [id number] Patient Care Team    Relationship Specialty Notifications Start End  [name] LABOR, DO PCP - General Family Medicine  02/04/20   Newton Mering, CNM Midwife Certified Nurse Midwife  02/04/20    Comment: ob/gyn- family tree    Chief Complaint  Patient presents with   Anxiety    Subjective:  Julie Cummings is a 33 y.o.  Female  present for chronic condition, combination appointment All past medical history, surgical history, allergies, family history, immunizations, medications and social history were updated in the electronic medical record today. All recent labs, ED visits and hospitalizations within the last year were reviewed.   anxiety/palpitations: Patient is compliant with Lexapro  20 mg every day and buspar  10 mg every day and she feels this has been helpful with her anxiety     01/02/2023    7:52 AM 07/10/2022   11:30 AM 07/10/2022    8:51 AM 06/28/2022    9:40 AM 11/28/2021    8:53 AM  Depression screen PHQ 2/9  Decreased Interest 0 0 0 0 1  Down, Depressed, Hopeless 0 0 0 0 0  PHQ - 2 Score 0 0 0 0 1  Altered sleeping  0 1 0 1  Tired, decreased energy  1 2 1 2   Change in appetite  1 2 2  0  Feeling bad or failure about yourself   0 0 0 0  Trouble concentrating  0 0 0 0  Moving slowly or fidgety/restless  0 0 0 0  Suicidal thoughts  0 0 0 0  PHQ-9 Score  2 5 3 4   Difficult doing work/chores   Not difficult at all Not difficult at all       01/02/2023    7:52 AM 07/10/2022   11:31 AM 07/10/2022    8:51 AM 06/28/2022    9:40 AM  GAD 7 : Generalized Anxiety Score  Nervous, Anxious, on Edge 1 1 3 2   Control/stop worrying 0 1 2 1   Worry too much - different things 0 1 2 2   Trouble relaxing 0 0 1 0  Restless 0 0 0 0  Easily annoyed or irritable 0 0 1 2  Afraid - awful might happen 0 1 2 2   Total GAD 7 Score 1 4 11 9   Anxiety Difficulty Not difficult at all  Somewhat  difficult Somewhat difficult   Immunization History  Administered Date(s) Administered   DTP 11/25/1990, 07/07/1991, 09/25/1991, 01/15/1992   DTaP 08/13/1995   HIB, Unspecified 11/25/1990, 07/07/1991, 09/25/1991, 01/15/1992   Hep B, Unspecified 10/10/2001, 12/09/2001, 04/24/2002   Influenza,inj,Quad PF,6+ Mos 11/05/2012   MMR 01/15/1992, 08/13/1995, 12/02/2010   OPV 11/25/1990, 07/07/1991, 01/15/1992, 08/13/1995   PPD Test 09/14/2019   Rho (D) Immune Globulin  12/02/2010, 09/24/2012, 11/19/2012   Tdap 05/16/2007, 12/02/2010, 11/20/2012, 05/20/2015    Past Medical History:  Diagnosis Date   Allergy     Chronic headaches    Depression    Eczema    Gall stones    GERD (gastroesophageal reflux disease)    History of UTI    Palpitations 02/04/2020   Preeclampsia    Rh negative state in antepartum period 10/07/2014   B-   Rhogam after 28wks: 04/27/2015     Allergies  Allergen Reactions   Morphine And Codeine Other (See Comments) and Palpitations    Racing heart; when patient had gallbladder taken out in December 2012,  MD told patient that her heart was racing and not to take Morphine again. Other reaction(s): Other (See Comments), Unknown Racing heart; when patient had gallbladder taken out in December 2012, MD told patient that her heart was racing and not to take Morphine again.   Morphine Palpitations   Past Surgical History:  Procedure Laterality Date   ADENOIDECTOMY     CHOLECYSTECTOMY  12/2010   ERCP     TONSILLECTOMY  1977   Family History  Problem Relation Age of Onset   Diabetes Mother    Arthritis Mother    Hearing loss Mother    Hypertension Mother    Diabetes Father    Hypertension Sister    Arthritis Sister    Depression Sister    Miscarriages / [place] Sister    Miscarriages / [place] Sister    Thyroid  disease Maternal Grandmother    CAD Maternal Grandfather    Diabetes Maternal Grandfather    Stroke Maternal Grandfather    Heart attack  Maternal Grandfather    Heart disease Maternal Grandfather    Hearing loss Maternal Grandfather    Hyperlipidemia Maternal Grandfather    Hypertension Maternal Grandfather    Heart disease Paternal Grandmother    Diabetes Paternal Grandfather    Heart disease Paternal Grandfather    Stroke Paternal Grandfather    Heart attack Paternal Grandfather    Allergic rhinitis Son    Asthma Son    Asthma Son    Hypertension Other        grandparents   Social History   Social History Narrative   Marital status/children/pets: Married, 3 children   Education/employment: Automotive engineer, TEFL teacher:      - wears seatbelt: Yes     - Feels safe in their relationships: Yes    Allergies as of 10/26/2023       Reactions   Morphine And Codeine Other (See Comments), Palpitations   Racing heart; when patient had gallbladder taken out in December 2012, MD told patient that her heart was racing and not to take Morphine again. Other reaction(s): Other (See Comments), Unknown Racing heart; when patient had gallbladder taken out in December 2012, MD told patient that her heart was racing and not to take Morphine again.   Morphine Palpitations        Medication List        Accurate as of October 26, 2023  2:14 PM. If you have any questions, ask your nurse or doctor.          STOP taking these medications    amoxicillin -clavulanate 875-125 MG tablet Commonly known as: AUGMENTIN  Stopped by: Julie Cummings   benzonatate  100 MG capsule Commonly known as: TESSALON  Stopped by: Julie Cummings       TAKE these medications    albuterol  108 (90 Base) MCG/ACT inhaler Commonly known as: VENTOLIN  HFA INHALE 1-2 PUFFS INTO THE LUNGS EVERY 6 HOURS AS NEEDED.   busPIRone  10 MG tablet Commonly known as: BUSPAR  Take 1 tablet (10 mg total) by mouth 2 (two) times daily.   EPINEPHrine  0.3 mg/0.3 mL Soaj injection Commonly known as: EPI-PEN INJECT 0.3 MG INTO THE MUSCLE AS NEEDED FOR  ANAPHYLAXIS.   escitalopram  20 MG tablet Commonly known as: LEXAPRO  Take 1 tablet (20 mg total) by mouth daily.   ipratropium 0.03 % nasal spray Commonly known as: ATROVENT  Place 2 sprays into both nostrils every 12 (twelve) hours.   Lo Loestrin Fe  1 MG-10 MCG / 10 MCG  tablet Generic drug: Norethindrone-Ethinyl Estradiol -Fe Biphas TAKE 1 TABLET BY MOUTH EVERY DAY   naproxen  500 MG tablet Commonly known as: NAPROSYN  Take 1 tablet (500 mg total) by mouth 2 (two) times daily with a meal.        All past medical history, surgical history, allergies, family history, immunizations andmedications were updated in the EMR today and reviewed under the history and medication portions of their EMR.      No results found.  ROS 14 pt review of systems performed and negative (unless mentioned in an HPI)  Objective: BP 116/74   Pulse 77   Temp 98.1 F (36.7 C)   Wt 227 lb (103 kg)   SpO2 98%   BMI 37.77 kg/m  Physical Exam Vitals and nursing note reviewed.  Constitutional:      General: She is not in acute distress.    Appearance: Normal appearance. She is normal weight. She is not ill-appearing or toxic-appearing.  HENT:     Head: Normocephalic and atraumatic.  Eyes:     General: No scleral icterus.       Right eye: No discharge.        Left eye: No discharge.     Extraocular Movements: Extraocular movements intact.     Conjunctiva/sclera: Conjunctivae normal.     Pupils: Pupils are equal, round, and reactive to light.  Skin:    Findings: No rash.  Neurological:     Mental Status: She is alert and oriented to person, place, and time. Mental status is at baseline.     Motor: No weakness.     Coordination: Coordination normal.     Gait: Gait normal.  Psychiatric:        Mood and Affect: Mood normal.        Behavior: Behavior normal.        Thought Content: Thought content normal.        Judgment: Judgment normal.     No results found.  Assessment/plan: Julie Cummings is a 33 y.o. female present for chronic condition follow-up Anxiety Stable Continue  Lexapro  20  mg qd Continue buspar  10 mg bid.  Tried in past: Zoloft , wellbutrin    Return in about 24 weeks (around 04/11/2024) for cpe (20 min), Routine chronic condition follow-up.  No orders of the defined types were placed in this encounter.  Meds ordered this encounter  Medications   busPIRone  (BUSPAR ) 10 MG tablet    Sig: Take 1 tablet (10 mg total) by mouth 2 (two) times daily.    Dispense:  180 tablet    Refill:  2   escitalopram  (LEXAPRO ) 20 MG tablet    Sig: Take 1 tablet (20 mg total) by mouth daily.    Dispense:  90 tablet    Refill:  1   Referral Orders  No referral(s) requested today    Electronically signed by: Julie Bellini, DO [facility] [place]

## 2023-10-26 NOTE — Patient Instructions (Addendum)

## 2024-01-19 ENCOUNTER — Telehealth: Admitting: Physician Assistant

## 2024-01-19 DIAGNOSIS — K047 Periapical abscess without sinus: Secondary | ICD-10-CM

## 2024-01-20 ENCOUNTER — Telehealth: Admitting: Physician Assistant

## 2024-01-20 DIAGNOSIS — K047 Periapical abscess without sinus: Secondary | ICD-10-CM

## 2024-01-20 MED ORDER — AMOXICILLIN-POT CLAVULANATE 875-125 MG PO TABS: 1.0000 | ORAL_TABLET | Freq: Two times a day (BID) | ORAL | 0 refills | Status: AC

## 2024-01-20 MED ORDER — NAPROXEN 500 MG PO TABS: 500.0000 mg | ORAL_TABLET | Freq: Two times a day (BID) | ORAL | 0 refills | Status: AC

## 2024-01-20 NOTE — Progress Notes (Signed)
" °  Because of severity of pain and associated neck stiffness and fever, I feel your condition warrants further evaluation and I recommend that you be seen in a face-to-face visit.   NOTE: There will be NO CHARGE for this E-Visit   If you are having a true medical emergency, please call 911.     For an urgent face to face visit, [place] has multiple urgent care centers for your convenience.  Click the link below for the full list of locations and hours, walk-in wait times, appointment scheduling options and driving directions:  Urgent Care - [place], [place], [place], [place], [place], [place]  [place]     Your MyChart E-visit questionnaire answers were reviewed by a board certified advanced clinical practitioner to complete your personal care plan based on your specific symptoms.    Thank you for using e-Visits.    "

## 2024-01-20 NOTE — Progress Notes (Signed)
 E-Visit for Dental Pain  We are sorry that you are not feeling well.  Here is how we plan to help!  Based on what you have shared with me in the questionnaire, it sounds like you have a dental infection. I know you noted that you did not mean to previously state that you were having next stiffness. I will go ahead and treat, but if anything note quickly resolving, you need an in-person evaluation.  I have prescribed Augmentin  875-125mg  twice a day for 7 days and Naprosyn  500mg  2 times a day for 7 days for discomfort  It is imperative that you see a dentist within 10 days of this eVisit to determine the cause of the dental pain and be sure it is adequately treated  A toothache or tooth pain is caused when the nerve in the root of a tooth or surrounding a tooth is irritated. Dental (tooth) infection, decay, injury, or loss of a tooth are the most common causes of dental pain. Pain may also occur after an extraction (tooth is pulled out). Pain sometimes originates from other areas and radiates to the jaw, thus appearing to be tooth pain.Bacteria growing inside your mouth can contribute to gum disease and dental decay, both of which can cause pain. A toothache occurs from inflammation of the central portion of the tooth called pulp. The pulp contains nerve endings that are very sensitive to pain. Inflammation to the pulp or pulpitis may be caused by dental cavities, trauma, and infection.    HOME CARE:   For toothaches: Over-the-counter pain medications such as acetaminophen  or ibuprofen  may be used. Take these as directed on the package while you arrange for a dental appointment. Avoid very cold or hot foods, because they may make the pain worse. You may get relief from biting on a cotton ball soaked in oil of cloves. You can get oil of cloves at most drug stores.  For jaw pain:  Aspirin may be helpful for problems in the joint of the jaw in adults. If pain happens every time you open your mouth  widely, the temporomandibular joint (TMJ) may be the source of the pain. Yawning or taking a large bite of food may worsen the pain. An appointment with your doctor or dentist will help you find the cause.     GET HELP RIGHT AWAY IF:  You have a high fever or chills If you have had a recent head or face injury and develop headache, light headedness, nausea, vomiting, or other symptoms that concern you after an injury to your face or mouth, you could have a more serious injury in addition to your dental injury. A facial rash associated with a toothache: This condition may improve with medication. Contact your doctor for them to decide what is appropriate. Any jaw pain occurring with chest pain: Although jaw pain is most commonly caused by dental disease, it is sometimes referred pain from other areas. People with heart disease, especially people who have had stents placed, people with diabetes, or those who have had heart surgery may have jaw pain as a symptom of heart attack or angina. If your jaw or tooth pain is associated with lightheadedness, sweating, or shortness of breath, you should see a doctor as soon as possible. Trouble swallowing or excessive pain or bleeding from gums: If you have a history of a weakened immune system, diabetes, or steroid use, you may be more susceptible to infections. Infections can often be more severe and extensive  or caused by unusual organisms. Dental and gum infections in people with these conditions may require more aggressive treatment. An abscess may need draining or IV antibiotics, for example.  MAKE SURE YOU   Understand these instructions. Will watch your condition. Will get help right away if you are not doing well or get worse.  Thank you for choosing an e-visit.  Your e-visit answers were reviewed by a board certified advanced clinical practitioner to complete your personal care plan. Depending upon the condition, your plan could have included both  over the counter or prescription medications.  Please review your pharmacy choice. Make sure the pharmacy is open so you can pick up prescription now. If there is a problem, you may contact your provider through [employer] and have the prescription routed to another pharmacy.  Your safety is important to us . If you have drug allergies check your prescription carefully.   For the next 24 hours you can use MyChart to ask questions about today's visit, request a non-urgent call back, or ask for a work or school excuse. You will get an email in the next two days asking about your experience. I hope that your e-visit has been valuable and will speed your recovery.  I have spent 5 minutes in review of e-visit questionnaire, review and updating patient chart, medical decision making and response to patient.   Elsie Velma Lunger, PA-C

## 2024-04-11 ENCOUNTER — Encounter: Admitting: Family Medicine
